# Patient Record
Sex: Female | Born: 1955
Health system: Southern US, Community
[De-identification: ages and names within clinical notes are randomized; demographics above are authoritative.]

## PROBLEM LIST (undated history)

## (undated) DIAGNOSIS — R112 Nausea with vomiting, unspecified: Secondary | ICD-10-CM

## (undated) DIAGNOSIS — Z9889 Other specified postprocedural states: Secondary | ICD-10-CM

## (undated) DIAGNOSIS — I10 Essential (primary) hypertension: Secondary | ICD-10-CM

## (undated) DIAGNOSIS — D329 Benign neoplasm of meninges, unspecified: Secondary | ICD-10-CM

## (undated) DIAGNOSIS — Z972 Presence of dental prosthetic device (complete) (partial): Secondary | ICD-10-CM

## (undated) DIAGNOSIS — F329 Major depressive disorder, single episode, unspecified: Secondary | ICD-10-CM

## (undated) DIAGNOSIS — K219 Gastro-esophageal reflux disease without esophagitis: Secondary | ICD-10-CM

## (undated) DIAGNOSIS — M1990 Unspecified osteoarthritis, unspecified site: Secondary | ICD-10-CM

## (undated) DIAGNOSIS — E119 Type 2 diabetes mellitus without complications: Secondary | ICD-10-CM

## (undated) DIAGNOSIS — I2510 Atherosclerotic heart disease of native coronary artery without angina pectoris: Secondary | ICD-10-CM

## (undated) DIAGNOSIS — T7840XA Allergy, unspecified, initial encounter: Secondary | ICD-10-CM

## (undated) DIAGNOSIS — I2119 ST elevation (STEMI) myocardial infarction involving other coronary artery of inferior wall: Secondary | ICD-10-CM

## (undated) DIAGNOSIS — F32A Depression, unspecified: Secondary | ICD-10-CM

## (undated) HISTORY — PX: ABDOMINAL HYSTERECTOMY: SHX81

## (undated) HISTORY — PX: KNEE SURGERY: SHX244

## (undated) HISTORY — DX: Benign neoplasm of meninges, unspecified: D32.9

## (undated) HISTORY — PX: COLONOSCOPY W/ BIOPSIES AND POLYPECTOMY: SHX1376

## (undated) HISTORY — PX: CORONARY ANGIOPLASTY WITH STENT PLACEMENT: SHX49

## (undated) HISTORY — PX: BREAST SURGERY: SHX581

## (undated) HISTORY — PX: BREAST EXCISIONAL BIOPSY: SUR124

## (undated) HISTORY — DX: Allergy, unspecified, initial encounter: T78.40XA

## (undated) HISTORY — PX: MULTIPLE TOOTH EXTRACTIONS: SHX2053

## (undated) HISTORY — PX: TUBAL LIGATION: SHX77

---

## 2001-07-21 ENCOUNTER — Other Ambulatory Visit: Admission: RE | Admit: 2001-07-21 | Discharge: 2001-07-21 | Payer: Self-pay | Admitting: Obstetrics and Gynecology

## 2002-07-28 ENCOUNTER — Other Ambulatory Visit: Admission: RE | Admit: 2002-07-28 | Discharge: 2002-07-28 | Payer: Self-pay | Admitting: Obstetrics and Gynecology

## 2003-07-29 ENCOUNTER — Other Ambulatory Visit: Admission: RE | Admit: 2003-07-29 | Discharge: 2003-07-29 | Payer: Self-pay | Admitting: Obstetrics and Gynecology

## 2005-03-06 ENCOUNTER — Ambulatory Visit: Payer: Self-pay | Admitting: Family Medicine

## 2005-03-14 ENCOUNTER — Emergency Department (HOSPITAL_COMMUNITY): Admission: EM | Admit: 2005-03-14 | Discharge: 2005-03-14 | Payer: Self-pay | Admitting: Emergency Medicine

## 2005-05-10 ENCOUNTER — Ambulatory Visit: Payer: Self-pay | Admitting: Family Medicine

## 2005-05-15 ENCOUNTER — Ambulatory Visit (HOSPITAL_COMMUNITY): Admission: RE | Admit: 2005-05-15 | Discharge: 2005-05-15 | Payer: Self-pay | Admitting: Family Medicine

## 2005-05-16 ENCOUNTER — Ambulatory Visit: Payer: Self-pay | Admitting: Family Medicine

## 2005-05-18 ENCOUNTER — Ambulatory Visit (HOSPITAL_COMMUNITY): Admission: RE | Admit: 2005-05-18 | Discharge: 2005-05-18 | Payer: Self-pay | Admitting: Family Medicine

## 2005-05-29 ENCOUNTER — Ambulatory Visit: Payer: Self-pay | Admitting: Family Medicine

## 2005-06-07 ENCOUNTER — Ambulatory Visit (HOSPITAL_COMMUNITY): Admission: RE | Admit: 2005-06-07 | Discharge: 2005-06-07 | Payer: Self-pay | Admitting: Obstetrics and Gynecology

## 2005-11-06 ENCOUNTER — Ambulatory Visit: Payer: Self-pay | Admitting: Family Medicine

## 2006-06-20 ENCOUNTER — Ambulatory Visit: Payer: Self-pay | Admitting: Family Medicine

## 2006-06-20 ENCOUNTER — Encounter (INDEPENDENT_AMBULATORY_CARE_PROVIDER_SITE_OTHER): Payer: Self-pay | Admitting: *Deleted

## 2006-07-04 ENCOUNTER — Ambulatory Visit: Payer: Self-pay | Admitting: Family Medicine

## 2006-07-25 ENCOUNTER — Ambulatory Visit: Payer: Self-pay | Admitting: Family Medicine

## 2006-07-29 ENCOUNTER — Ambulatory Visit: Payer: Self-pay | Admitting: Family Medicine

## 2006-08-15 ENCOUNTER — Ambulatory Visit: Payer: Self-pay | Admitting: Family Medicine

## 2006-12-05 ENCOUNTER — Ambulatory Visit: Payer: Self-pay | Admitting: Family Medicine

## 2006-12-10 DIAGNOSIS — F329 Major depressive disorder, single episode, unspecified: Secondary | ICD-10-CM

## 2006-12-10 DIAGNOSIS — F17200 Nicotine dependence, unspecified, uncomplicated: Secondary | ICD-10-CM | POA: Insufficient documentation

## 2006-12-10 DIAGNOSIS — J329 Chronic sinusitis, unspecified: Secondary | ICD-10-CM | POA: Insufficient documentation

## 2006-12-10 DIAGNOSIS — F419 Anxiety disorder, unspecified: Secondary | ICD-10-CM | POA: Insufficient documentation

## 2006-12-10 DIAGNOSIS — J309 Allergic rhinitis, unspecified: Secondary | ICD-10-CM

## 2006-12-10 DIAGNOSIS — J3089 Other allergic rhinitis: Secondary | ICD-10-CM | POA: Insufficient documentation

## 2006-12-10 DIAGNOSIS — K219 Gastro-esophageal reflux disease without esophagitis: Secondary | ICD-10-CM | POA: Insufficient documentation

## 2006-12-10 DIAGNOSIS — E669 Obesity, unspecified: Secondary | ICD-10-CM

## 2006-12-10 DIAGNOSIS — F32A Depression, unspecified: Secondary | ICD-10-CM | POA: Insufficient documentation

## 2006-12-10 DIAGNOSIS — E785 Hyperlipidemia, unspecified: Secondary | ICD-10-CM | POA: Insufficient documentation

## 2006-12-10 DIAGNOSIS — Z6841 Body Mass Index (BMI) 40.0 and over, adult: Secondary | ICD-10-CM | POA: Insufficient documentation

## 2006-12-10 DIAGNOSIS — I10 Essential (primary) hypertension: Secondary | ICD-10-CM | POA: Insufficient documentation

## 2007-01-15 ENCOUNTER — Ambulatory Visit: Payer: Self-pay | Admitting: Family Medicine

## 2007-01-15 LAB — CONVERTED CEMR LAB
Cholesterol, target level: 200 mg/dL
LDL Goal: 160 mg/dL

## 2007-01-20 ENCOUNTER — Ambulatory Visit (HOSPITAL_COMMUNITY): Admission: RE | Admit: 2007-01-20 | Discharge: 2007-01-20 | Payer: Self-pay | Admitting: Family Medicine

## 2007-01-20 ENCOUNTER — Encounter (INDEPENDENT_AMBULATORY_CARE_PROVIDER_SITE_OTHER): Payer: Self-pay | Admitting: Family Medicine

## 2007-02-12 ENCOUNTER — Ambulatory Visit: Payer: Self-pay | Admitting: Family Medicine

## 2007-02-12 LAB — CONVERTED CEMR LAB: LDL Goal: 130 mg/dL

## 2007-02-13 ENCOUNTER — Encounter (INDEPENDENT_AMBULATORY_CARE_PROVIDER_SITE_OTHER): Payer: Self-pay | Admitting: Family Medicine

## 2007-02-14 ENCOUNTER — Telehealth (INDEPENDENT_AMBULATORY_CARE_PROVIDER_SITE_OTHER): Payer: Self-pay | Admitting: Family Medicine

## 2007-02-14 ENCOUNTER — Encounter (INDEPENDENT_AMBULATORY_CARE_PROVIDER_SITE_OTHER): Payer: Self-pay | Admitting: Family Medicine

## 2007-02-18 ENCOUNTER — Telehealth (INDEPENDENT_AMBULATORY_CARE_PROVIDER_SITE_OTHER): Payer: Self-pay | Admitting: Family Medicine

## 2007-02-18 ENCOUNTER — Ambulatory Visit (HOSPITAL_COMMUNITY): Admission: RE | Admit: 2007-02-18 | Discharge: 2007-02-18 | Payer: Self-pay | Admitting: Family Medicine

## 2007-02-18 LAB — CONVERTED CEMR LAB
AST: 17 units/L (ref 0–37)
Alkaline Phosphatase: 100 units/L (ref 39–117)
BUN: 12 mg/dL (ref 6–23)
CO2: 23 meq/L (ref 19–32)
Calcium: 9.6 mg/dL (ref 8.4–10.5)
Chloride: 101 meq/L (ref 96–112)
Creatinine, Ser: 0.67 mg/dL (ref 0.40–1.20)
HDL: 59 mg/dL (ref >39)
Hepatitis B-Post: 3.6 milliintl units/mL
LDL Cholesterol: 137 mg/dL — ABNORMAL HIGH (ref 0–99)
Mumps IgG: 2.12 — ABNORMAL HIGH
Potassium: 3.8 meq/L (ref 3.5–5.3)
Sodium: 137 meq/L (ref 135–145)
Total Bilirubin: 0.6 mg/dL (ref 0.3–1.2)
Total CHOL/HDL Ratio: 3.5
Varicella IgG: 2.48 — ABNORMAL HIGH

## 2007-02-19 ENCOUNTER — Telehealth (INDEPENDENT_AMBULATORY_CARE_PROVIDER_SITE_OTHER): Payer: Self-pay | Admitting: Family Medicine

## 2007-02-24 ENCOUNTER — Ambulatory Visit: Payer: Self-pay | Admitting: Family Medicine

## 2007-02-25 ENCOUNTER — Encounter (INDEPENDENT_AMBULATORY_CARE_PROVIDER_SITE_OTHER): Payer: Self-pay | Admitting: Family Medicine

## 2007-02-25 LAB — CONVERTED CEMR LAB: Rubeola IgG: 2.52 — ABNORMAL HIGH

## 2007-03-05 ENCOUNTER — Encounter: Admission: RE | Admit: 2007-03-05 | Discharge: 2007-03-05 | Payer: Self-pay | Admitting: Family Medicine

## 2007-03-05 ENCOUNTER — Telehealth (INDEPENDENT_AMBULATORY_CARE_PROVIDER_SITE_OTHER): Payer: Self-pay | Admitting: Family Medicine

## 2007-03-07 ENCOUNTER — Encounter (INDEPENDENT_AMBULATORY_CARE_PROVIDER_SITE_OTHER): Payer: Self-pay | Admitting: Family Medicine

## 2007-03-27 ENCOUNTER — Ambulatory Visit: Payer: Self-pay | Admitting: Family Medicine

## 2007-03-27 DIAGNOSIS — R5381 Other malaise: Secondary | ICD-10-CM | POA: Insufficient documentation

## 2007-03-27 DIAGNOSIS — R5383 Other fatigue: Secondary | ICD-10-CM

## 2007-03-28 ENCOUNTER — Encounter (INDEPENDENT_AMBULATORY_CARE_PROVIDER_SITE_OTHER): Payer: Self-pay | Admitting: Family Medicine

## 2007-03-31 LAB — CONVERTED CEMR LAB
BUN: 16 mg/dL (ref 6–23)
Basophils Absolute: 0 10*3/uL (ref 0.0–0.1)
Basophils Relative: 0 % (ref 0–1)
CO2: 16 meq/L — ABNORMAL LOW (ref 19–32)
Calcium: 9.2 mg/dL (ref 8.4–10.5)
Chloride: 110 meq/L (ref 96–112)
Creatinine, Ser: 0.77 mg/dL (ref 0.40–1.20)
Eosinophils Absolute: 0.2 10*3/uL (ref 0.0–0.7)
Eosinophils Relative: 2 % (ref 0–5)
Glucose, Bld: 111 mg/dL — ABNORMAL HIGH (ref 70–99)
HCT: 42.3 % (ref 36.0–46.0)
Hemoglobin: 14.5 g/dL (ref 12.0–15.0)
Lymphocytes Relative: 41 % (ref 12–46)
Lymphs Abs: 2.8 10*3/uL (ref 0.7–3.3)
MCHC: 34.3 g/dL (ref 30.0–36.0)
MCV: 84.4 fL (ref 78.0–100.0)
Monocytes Absolute: 0.5 10*3/uL (ref 0.2–0.7)
Monocytes Relative: 8 % (ref 3–11)
Neutro Abs: 3.4 10*3/uL (ref 1.7–7.7)
Neutrophils Relative %: 49 % (ref 43–77)
Platelets: 277 10*3/uL (ref 150–400)
Potassium: 4.1 meq/L (ref 3.5–5.3)
RBC: 5.01 M/uL (ref 3.87–5.11)
RDW: 15.4 % — ABNORMAL HIGH (ref 11.5–14.0)
Sodium: 143 meq/L (ref 135–145)
TSH: 0.915 microintl units/mL (ref 0.350–5.50)
WBC: 7 10*3/uL (ref 4.0–10.5)

## 2007-04-08 ENCOUNTER — Encounter (INDEPENDENT_AMBULATORY_CARE_PROVIDER_SITE_OTHER): Payer: Self-pay | Admitting: Family Medicine

## 2007-04-17 ENCOUNTER — Ambulatory Visit (HOSPITAL_COMMUNITY): Admission: RE | Admit: 2007-04-17 | Discharge: 2007-04-17 | Payer: Self-pay | Admitting: Family Medicine

## 2007-04-17 ENCOUNTER — Encounter (INDEPENDENT_AMBULATORY_CARE_PROVIDER_SITE_OTHER): Payer: Self-pay | Admitting: Family Medicine

## 2007-04-18 ENCOUNTER — Encounter (INDEPENDENT_AMBULATORY_CARE_PROVIDER_SITE_OTHER): Payer: Self-pay | Admitting: Family Medicine

## 2007-04-18 DIAGNOSIS — D1800 Hemangioma unspecified site: Secondary | ICD-10-CM | POA: Insufficient documentation

## 2007-04-22 ENCOUNTER — Encounter (INDEPENDENT_AMBULATORY_CARE_PROVIDER_SITE_OTHER): Payer: Self-pay | Admitting: Family Medicine

## 2007-05-06 ENCOUNTER — Encounter (INDEPENDENT_AMBULATORY_CARE_PROVIDER_SITE_OTHER): Payer: Self-pay | Admitting: Family Medicine

## 2007-05-30 ENCOUNTER — Ambulatory Visit: Payer: Self-pay | Admitting: Family Medicine

## 2007-05-30 ENCOUNTER — Telehealth (INDEPENDENT_AMBULATORY_CARE_PROVIDER_SITE_OTHER): Payer: Self-pay | Admitting: Family Medicine

## 2007-06-16 ENCOUNTER — Telehealth (INDEPENDENT_AMBULATORY_CARE_PROVIDER_SITE_OTHER): Payer: Self-pay | Admitting: Family Medicine

## 2007-06-24 ENCOUNTER — Ambulatory Visit: Payer: Self-pay | Admitting: Family Medicine

## 2007-06-24 ENCOUNTER — Telehealth (INDEPENDENT_AMBULATORY_CARE_PROVIDER_SITE_OTHER): Payer: Self-pay | Admitting: *Deleted

## 2007-06-24 DIAGNOSIS — G4710 Hypersomnia, unspecified: Secondary | ICD-10-CM | POA: Insufficient documentation

## 2007-06-24 DIAGNOSIS — G4730 Sleep apnea, unspecified: Secondary | ICD-10-CM

## 2007-06-25 ENCOUNTER — Encounter (INDEPENDENT_AMBULATORY_CARE_PROVIDER_SITE_OTHER): Payer: Self-pay | Admitting: Family Medicine

## 2007-07-04 ENCOUNTER — Encounter (INDEPENDENT_AMBULATORY_CARE_PROVIDER_SITE_OTHER): Payer: Self-pay | Admitting: Family Medicine

## 2007-07-16 ENCOUNTER — Encounter (INDEPENDENT_AMBULATORY_CARE_PROVIDER_SITE_OTHER): Payer: Self-pay | Admitting: Family Medicine

## 2007-07-16 ENCOUNTER — Ambulatory Visit (HOSPITAL_BASED_OUTPATIENT_CLINIC_OR_DEPARTMENT_OTHER): Admission: RE | Admit: 2007-07-16 | Discharge: 2007-07-16 | Payer: Self-pay | Admitting: Family Medicine

## 2007-07-20 ENCOUNTER — Ambulatory Visit: Payer: Self-pay | Admitting: Internal Medicine

## 2007-07-21 ENCOUNTER — Encounter (INDEPENDENT_AMBULATORY_CARE_PROVIDER_SITE_OTHER): Payer: Self-pay | Admitting: Family Medicine

## 2007-08-09 ENCOUNTER — Emergency Department (HOSPITAL_COMMUNITY): Admission: EM | Admit: 2007-08-09 | Discharge: 2007-08-09 | Payer: Self-pay | Admitting: Emergency Medicine

## 2007-08-15 ENCOUNTER — Telehealth (INDEPENDENT_AMBULATORY_CARE_PROVIDER_SITE_OTHER): Payer: Self-pay | Admitting: *Deleted

## 2007-08-19 ENCOUNTER — Ambulatory Visit: Payer: Self-pay | Admitting: Family Medicine

## 2007-08-19 ENCOUNTER — Telehealth (INDEPENDENT_AMBULATORY_CARE_PROVIDER_SITE_OTHER): Payer: Self-pay | Admitting: *Deleted

## 2007-08-20 ENCOUNTER — Telehealth (INDEPENDENT_AMBULATORY_CARE_PROVIDER_SITE_OTHER): Payer: Self-pay | Admitting: *Deleted

## 2007-08-20 ENCOUNTER — Telehealth (INDEPENDENT_AMBULATORY_CARE_PROVIDER_SITE_OTHER): Payer: Self-pay | Admitting: Family Medicine

## 2007-08-21 ENCOUNTER — Encounter (INDEPENDENT_AMBULATORY_CARE_PROVIDER_SITE_OTHER): Payer: Self-pay | Admitting: Family Medicine

## 2007-09-02 ENCOUNTER — Telehealth (INDEPENDENT_AMBULATORY_CARE_PROVIDER_SITE_OTHER): Payer: Self-pay | Admitting: *Deleted

## 2007-09-08 ENCOUNTER — Telehealth (INDEPENDENT_AMBULATORY_CARE_PROVIDER_SITE_OTHER): Payer: Self-pay | Admitting: *Deleted

## 2007-09-09 ENCOUNTER — Encounter (INDEPENDENT_AMBULATORY_CARE_PROVIDER_SITE_OTHER): Payer: Self-pay | Admitting: Family Medicine

## 2007-09-11 ENCOUNTER — Telehealth (INDEPENDENT_AMBULATORY_CARE_PROVIDER_SITE_OTHER): Payer: Self-pay | Admitting: *Deleted

## 2007-09-11 ENCOUNTER — Encounter (HOSPITAL_COMMUNITY): Admission: RE | Admit: 2007-09-11 | Discharge: 2007-10-11 | Payer: Self-pay | Admitting: Family Medicine

## 2007-12-03 ENCOUNTER — Ambulatory Visit: Payer: Self-pay | Admitting: Family Medicine

## 2007-12-03 DIAGNOSIS — G5600 Carpal tunnel syndrome, unspecified upper limb: Secondary | ICD-10-CM | POA: Insufficient documentation

## 2007-12-04 ENCOUNTER — Encounter: Payer: Self-pay | Admitting: Family Medicine

## 2007-12-19 ENCOUNTER — Telehealth (INDEPENDENT_AMBULATORY_CARE_PROVIDER_SITE_OTHER): Payer: Self-pay | Admitting: Family Medicine

## 2007-12-24 ENCOUNTER — Telehealth (INDEPENDENT_AMBULATORY_CARE_PROVIDER_SITE_OTHER): Payer: Self-pay | Admitting: *Deleted

## 2007-12-26 ENCOUNTER — Ambulatory Visit: Payer: Self-pay | Admitting: Family Medicine

## 2007-12-30 ENCOUNTER — Ambulatory Visit (HOSPITAL_COMMUNITY): Admission: RE | Admit: 2007-12-30 | Discharge: 2007-12-30 | Payer: Self-pay | Admitting: Family Medicine

## 2007-12-30 ENCOUNTER — Telehealth (INDEPENDENT_AMBULATORY_CARE_PROVIDER_SITE_OTHER): Payer: Self-pay | Admitting: *Deleted

## 2007-12-30 ENCOUNTER — Encounter (INDEPENDENT_AMBULATORY_CARE_PROVIDER_SITE_OTHER): Payer: Self-pay | Admitting: Family Medicine

## 2007-12-31 ENCOUNTER — Telehealth (INDEPENDENT_AMBULATORY_CARE_PROVIDER_SITE_OTHER): Payer: Self-pay | Admitting: *Deleted

## 2008-01-12 ENCOUNTER — Encounter (INDEPENDENT_AMBULATORY_CARE_PROVIDER_SITE_OTHER): Payer: Self-pay | Admitting: Family Medicine

## 2008-01-20 ENCOUNTER — Ambulatory Visit: Payer: Self-pay | Admitting: Family Medicine

## 2008-03-05 ENCOUNTER — Telehealth (INDEPENDENT_AMBULATORY_CARE_PROVIDER_SITE_OTHER): Payer: Self-pay | Admitting: Family Medicine

## 2008-03-05 ENCOUNTER — Ambulatory Visit: Payer: Self-pay | Admitting: Family Medicine

## 2008-03-05 DIAGNOSIS — S0990XA Unspecified injury of head, initial encounter: Secondary | ICD-10-CM | POA: Insufficient documentation

## 2008-03-09 ENCOUNTER — Telehealth (INDEPENDENT_AMBULATORY_CARE_PROVIDER_SITE_OTHER): Payer: Self-pay | Admitting: *Deleted

## 2008-03-10 ENCOUNTER — Ambulatory Visit (HOSPITAL_COMMUNITY): Admission: RE | Admit: 2008-03-10 | Discharge: 2008-03-10 | Payer: Self-pay | Admitting: Family Medicine

## 2008-03-10 ENCOUNTER — Telehealth (INDEPENDENT_AMBULATORY_CARE_PROVIDER_SITE_OTHER): Payer: Self-pay | Admitting: *Deleted

## 2008-03-10 ENCOUNTER — Encounter (INDEPENDENT_AMBULATORY_CARE_PROVIDER_SITE_OTHER): Payer: Self-pay | Admitting: Family Medicine

## 2008-03-10 LAB — CONVERTED CEMR LAB
ALT: 16 units/L (ref 0–35)
AST: 18 units/L (ref 0–37)
BUN: 13 mg/dL (ref 6–23)
Basophils Relative: 0 % (ref 0–1)
Calcium: 9.6 mg/dL (ref 8.4–10.5)
Chloride: 101 meq/L (ref 96–112)
Creatinine, Ser: 0.61 mg/dL (ref 0.40–1.20)
Eosinophils Absolute: 0.2 10*3/uL (ref 0.0–0.7)
Glucose, Bld: 105 mg/dL — ABNORMAL HIGH (ref 70–99)
Hemoglobin: 13.8 g/dL (ref 12.0–15.0)
Lymphocytes Relative: 38 % (ref 12–46)
Lymphs Abs: 2.6 10*3/uL (ref 0.7–4.0)
MCHC: 34.7 g/dL (ref 30.0–36.0)
Neutrophils Relative %: 49 % (ref 43–77)
Potassium: 3.8 meq/L (ref 3.5–5.3)
RBC: 4.81 M/uL (ref 3.87–5.11)
RDW: 15.1 % (ref 11.5–15.5)
Total Bilirubin: 0.4 mg/dL (ref 0.3–1.2)
WBC: 7 10*3/uL (ref 4.0–10.5)

## 2008-03-11 ENCOUNTER — Telehealth (INDEPENDENT_AMBULATORY_CARE_PROVIDER_SITE_OTHER): Payer: Self-pay | Admitting: *Deleted

## 2008-03-11 LAB — CONVERTED CEMR LAB
HDL: 56 mg/dL (ref >39)
LDL Cholesterol: 131 mg/dL — ABNORMAL HIGH (ref 0–99)
TSH: 1.212 microintl units/mL (ref 0.350–5.50)
VLDL: 12 mg/dL (ref 0–40)

## 2008-03-16 ENCOUNTER — Encounter (INDEPENDENT_AMBULATORY_CARE_PROVIDER_SITE_OTHER): Payer: Self-pay | Admitting: Family Medicine

## 2008-03-23 ENCOUNTER — Encounter (INDEPENDENT_AMBULATORY_CARE_PROVIDER_SITE_OTHER): Payer: Self-pay | Admitting: Family Medicine

## 2008-03-30 ENCOUNTER — Encounter (INDEPENDENT_AMBULATORY_CARE_PROVIDER_SITE_OTHER): Payer: Self-pay | Admitting: Family Medicine

## 2008-04-02 ENCOUNTER — Ambulatory Visit: Payer: Self-pay | Admitting: Family Medicine

## 2008-04-02 ENCOUNTER — Telehealth (INDEPENDENT_AMBULATORY_CARE_PROVIDER_SITE_OTHER): Payer: Self-pay | Admitting: Family Medicine

## 2008-04-06 ENCOUNTER — Encounter (INDEPENDENT_AMBULATORY_CARE_PROVIDER_SITE_OTHER): Payer: Self-pay | Admitting: Family Medicine

## 2008-04-14 ENCOUNTER — Telehealth (INDEPENDENT_AMBULATORY_CARE_PROVIDER_SITE_OTHER): Payer: Self-pay | Admitting: *Deleted

## 2008-04-14 ENCOUNTER — Ambulatory Visit: Payer: Self-pay | Admitting: Family Medicine

## 2008-04-14 LAB — CONVERTED CEMR LAB
Glucose, Urine, Semiquant: NEGATIVE
Nitrite: NEGATIVE
Protein, U semiquant: NEGATIVE
pH: 7

## 2008-06-08 ENCOUNTER — Ambulatory Visit: Payer: Self-pay | Admitting: Family Medicine

## 2008-07-06 ENCOUNTER — Ambulatory Visit: Payer: Self-pay | Admitting: Family Medicine

## 2008-08-04 ENCOUNTER — Telehealth (INDEPENDENT_AMBULATORY_CARE_PROVIDER_SITE_OTHER): Payer: Self-pay | Admitting: *Deleted

## 2008-08-20 ENCOUNTER — Ambulatory Visit: Payer: Self-pay | Admitting: Family Medicine

## 2008-08-25 ENCOUNTER — Telehealth (INDEPENDENT_AMBULATORY_CARE_PROVIDER_SITE_OTHER): Payer: Self-pay | Admitting: *Deleted

## 2008-08-31 ENCOUNTER — Ambulatory Visit: Payer: Self-pay | Admitting: Family Medicine

## 2008-09-28 ENCOUNTER — Ambulatory Visit: Payer: Self-pay | Admitting: Family Medicine

## 2009-02-04 ENCOUNTER — Ambulatory Visit: Payer: Self-pay | Admitting: Family Medicine

## 2009-02-06 ENCOUNTER — Encounter (INDEPENDENT_AMBULATORY_CARE_PROVIDER_SITE_OTHER): Payer: Self-pay | Admitting: Family Medicine

## 2009-02-07 LAB — CONVERTED CEMR LAB
Albumin: 4.2 g/dL (ref 3.5–5.2)
Alkaline Phosphatase: 120 units/L — ABNORMAL HIGH (ref 39–117)
BUN: 17 mg/dL (ref 6–23)
Band Neutrophils: 0 % (ref 0–10)
Basophils Absolute: 0 10*3/uL (ref 0.0–0.1)
Basophils Relative: 1 % (ref 0–1)
CO2: 25 meq/L (ref 19–32)
Calcium: 9.5 mg/dL (ref 8.4–10.5)
Chloride: 103 meq/L (ref 96–112)
Creatinine, Ser: 0.72 mg/dL (ref 0.40–1.20)
Glucose, Bld: 113 mg/dL — ABNORMAL HIGH (ref 70–99)
HCT: 42.1 % (ref 36.0–46.0)
MCV: 83.2 fL (ref 78.0–100.0)
Neutro Abs: 3.5 10*3/uL (ref 1.7–7.7)
Neutrophils Relative %: 49 % (ref 43–77)
Platelets: 290 10*3/uL (ref 150–400)
Total CHOL/HDL Ratio: 3.1
Total Protein: 7.4 g/dL (ref 6.0–8.3)
VLDL: 14 mg/dL (ref 0–40)

## 2009-02-14 ENCOUNTER — Ambulatory Visit (HOSPITAL_COMMUNITY): Admission: RE | Admit: 2009-02-14 | Discharge: 2009-02-14 | Payer: Self-pay | Admitting: Family Medicine

## 2009-02-22 ENCOUNTER — Telehealth (INDEPENDENT_AMBULATORY_CARE_PROVIDER_SITE_OTHER): Payer: Self-pay | Admitting: *Deleted

## 2009-03-21 ENCOUNTER — Encounter (INDEPENDENT_AMBULATORY_CARE_PROVIDER_SITE_OTHER): Payer: Self-pay | Admitting: Family Medicine

## 2009-03-21 ENCOUNTER — Ambulatory Visit: Payer: Self-pay | Admitting: Internal Medicine

## 2009-03-21 DIAGNOSIS — M5430 Sciatica, unspecified side: Secondary | ICD-10-CM | POA: Insufficient documentation

## 2009-04-07 ENCOUNTER — Encounter (INDEPENDENT_AMBULATORY_CARE_PROVIDER_SITE_OTHER): Payer: Self-pay | Admitting: Family Medicine

## 2009-04-11 ENCOUNTER — Telehealth (INDEPENDENT_AMBULATORY_CARE_PROVIDER_SITE_OTHER): Payer: Self-pay | Admitting: Internal Medicine

## 2009-04-18 ENCOUNTER — Encounter (INDEPENDENT_AMBULATORY_CARE_PROVIDER_SITE_OTHER): Payer: Self-pay | Admitting: Family Medicine

## 2010-12-24 ENCOUNTER — Encounter: Payer: Self-pay | Admitting: Family Medicine

## 2010-12-31 LAB — CONVERTED CEMR LAB
Ketones, urine, test strip: NEGATIVE
Protein, U semiquant: NEGATIVE
Urobilinogen, UA: 0.2
pH: 6

## 2011-01-04 NOTE — Letter (Signed)
Summary: Historic Patient File  Historic Patient File   Imported By: Lind Guest 12/08/2010 15:52:53  _____________________________________________________________________  External Attachment:    Type:   Image     Comment:   External Document

## 2011-04-17 NOTE — Procedures (Signed)
Theresa Gibson, Theresa Gibson               ACCOUNT NO.:  [id number]   MEDICAL RECORD NO.:  [id number]          PATIENT TYPE:  OUT   LOCATION:  SLEEP CENTER                 FACILITY:  [facility]   PHYSICIAN:  Clinton D. Maple Hudson, MD, FCCP, FACPDATE OF BIRTH:  03/21/1958   DATE OF STUDY:  07/16/2007                            NOCTURNAL POLYSOMNOGRAM   REFERRING PHYSICIAN:  Franchot Heidelberg, M.D.   INDICATION FOR STUDY:  Insomnia with sleep apnea.   EPWORTH SLEEPINESS SCORE:  12/24.   BMI:  33.9.   WEIGHT:  199 pounds.   MEDICATIONS:  Listed and reviewed.   SLEEP ARCHITECTURE:  Total sleep time 295 minutes with sleep efficiency  of 83%.  Stage 1 was 11%, stage 2 was 64%, stage 3 was 10%, REM 16% of  total sleep time.  Sleep latency 10 minutes.  REM latency 122 minutes.  Awake after sleep onset 32 minutes.  Arousal index 20.1.  Tylenol P.M.  was taken at 0017 a.m.  This was a daytime study recorded from 8:39 a.m.  to 2:33 p.m.   RESPIRATORY DATA:  Apnea/hypopnea index (AHI, RDI) 2.8 obstructive  events per hour which is within normal limits (normal range 0-5 per  hour).  Events were limited to a total of 14 hypopneas, all associated  with supine sleep position.  REM AHI 17.7 per hour.  CPAP titration was  not indicated by split protocol.   OXYGEN DATA:  Moderate to loud snoring with oxygen saturation nadir 87%.  Mean oxygen saturation through the study 96% on room air.   CARDIAC DATA:  Normal sinus rhythm.   MOVEMENT-PARASOMNIA:  Occasional limb jerk, insignificant.   IMPRESSIONS-RECOMMENDATIONS:  1. Daytime sleep study done to accommodate the patient's third shift      work schedule, with Tylenol P.M. taken at 0715 a.m.  2. Sleep architecture was not remarkable for sleep center environment.  3. Occasional sleep disorder breathing events, apnea/hypopnea index      2.8 per hour (normal range 0-5 per hour) with moderate to loud      intermittent snoring and oxygen desaturation to a nadir  of 87%.      Clinton D. Maple Hudson, MD, [facility], FACP  Diplomate, Biomedical engineer of Sleep Medicine  Electronically Signed     CDY/MEDQ  D:  07/20/2007 14:01:20  T:  07/21/2007 09:13:28  Job:  [id number]

## 2011-09-14 LAB — CBC
Platelets: 256
RDW: 14.4 — ABNORMAL HIGH

## 2011-09-14 LAB — DIFFERENTIAL
Basophils Relative: 0
Lymphs Abs: 1.6
Monocytes Absolute: 0.4
Monocytes Relative: 4
Neutro Abs: 8 — ABNORMAL HIGH

## 2011-09-14 LAB — COMPREHENSIVE METABOLIC PANEL
ALT: 19
Albumin: 3.4 — ABNORMAL LOW
Alkaline Phosphatase: 111
BUN: 10
Calcium: 9.2
Potassium: 4.1
Sodium: 136
Total Protein: 7

## 2012-04-06 ENCOUNTER — Encounter (HOSPITAL_COMMUNITY): Payer: Self-pay

## 2012-04-06 ENCOUNTER — Emergency Department (HOSPITAL_COMMUNITY)
Admission: EM | Admit: 2012-04-06 | Discharge: 2012-04-06 | Disposition: A | Discharge status: Home / Self Care | Payer: Self-pay | Attending: Emergency Medicine | Admitting: Emergency Medicine

## 2012-04-06 DIAGNOSIS — L959 Vasculitis limited to the skin, unspecified: Secondary | ICD-10-CM

## 2012-04-06 DIAGNOSIS — Z79899 Other long term (current) drug therapy: Secondary | ICD-10-CM | POA: Insufficient documentation

## 2012-04-06 DIAGNOSIS — I10 Essential (primary) hypertension: Secondary | ICD-10-CM | POA: Insufficient documentation

## 2012-04-06 DIAGNOSIS — L988 Other specified disorders of the skin and subcutaneous tissue: Secondary | ICD-10-CM | POA: Insufficient documentation

## 2012-04-06 HISTORY — DX: Essential (primary) hypertension: I10

## 2012-04-06 HISTORY — DX: Unspecified osteoarthritis, unspecified site: M19.90

## 2012-04-06 LAB — CBC
HCT: 41.7 % (ref 36.0–46.0)
Hemoglobin: 14.3 g/dL (ref 12.0–15.0)
MCH: 28.9 pg (ref 26.0–34.0)
MCV: 84.4 fL (ref 78.0–100.0)
RBC: 4.94 MIL/uL (ref 3.87–5.11)

## 2012-04-06 LAB — DIFFERENTIAL
Eosinophils Absolute: 0.1 10*3/uL (ref 0.0–0.7)
Lymphs Abs: 2.2 10*3/uL (ref 0.7–4.0)
Monocytes Relative: 6 % (ref 3–12)
Neutrophils Relative %: 53 % (ref 43–77)

## 2012-04-06 LAB — PROTIME-INR: INR: 0.99 (ref 0.00–1.49)

## 2012-04-06 NOTE — ED Provider Notes (Signed)
History     CSN: [id number]  Arrival date & time 04/06/12  1025   First MD Initiated Contact with Patient 04/06/12 1056      Chief Complaint  Patient presents with  . Rash    (Consider location/radiation/quality/duration/timing/severity/associated sxs/prior treatment) HPI Comments: Pt notes multiple red spots on L lower leg that have been there ~ 1 week.  No trauma.  No notable skin contacts.  No blood noted in urine, stools or from coughing.  No neurol sxs.  No known bleeding abnormalities.  The history is provided by the patient. No language interpreter was used.    Past Medical History  Diagnosis Date  . Hypertension   . Arthritis     Past Surgical History  Procedure Date  . Knee surgery   . Abdominal hysterectomy     No family history on file.  History  Substance Use Topics  . Smoking status: Current Some Day Smoker    Types: Cigarettes  . Smokeless tobacco: Not on file  . Alcohol Use: Yes     occ    OB History    Grav Para Term Preterm Abortions TAB SAB Ect Mult Living                  Review of Systems  Constitutional: Negative for fever.  HENT: Negative for nosebleeds.   Respiratory: Negative for cough.   Genitourinary: Negative for hematuria.  All other systems reviewed and are negative.    Allergies  Ace inhibitors  Home Medications   Current Outpatient Rx  Name Route Sig Dispense Refill  . AMLODIPINE BESYLATE 10 MG PO TABS Oral Take 10 mg by mouth daily.    . TOPIRAMATE 50 MG PO TABS Oral Take 50 mg by mouth 2 (two) times daily.      BP 107/76  Pulse 66  Temp(Src) 97.9 F (36.6 C) (Oral)  Resp 20  Ht 5\' 4"  (1.626 m)  Wt 200 lb (90.719 kg)  BMI 34.33 kg/m2  SpO2 99%  Physical Exam  Nursing note and vitals reviewed. Constitutional: She is oriented to person, place, and time. She appears well-developed and well-nourished. No distress.  HENT:  Head: Normocephalic and atraumatic.  Eyes: EOM are normal.  Neck: Normal range of  motion.  Cardiovascular: Normal rate, regular rhythm and normal heart sounds.   Pulmonary/Chest: Effort normal and breath sounds normal.  Abdominal: Soft. She exhibits no distension. There is no tenderness.  Musculoskeletal: Normal range of motion.       Legs:      10-12 circular red spots on L lower leg from 1-2 cm in diam.  Areas not raised.  Not urticarial.  Skin intact.  Appearance c/w vasculitis.  Neurological: She is alert and oriented to person, place, and time.  Skin: Skin is warm and dry. No rash noted.  Psychiatric: She has a normal mood and affect. Judgment normal.    ED Course  Procedures (including critical care time)   Labs Reviewed  CBC  DIFFERENTIAL  PROTIME-INR  APTT   No results found.   1. Vasculitis of skin       MDM  No tx presently.  Return to ED if sxs worsen or change.        Worthy Rancher, PA 04/06/12 1306

## 2012-04-06 NOTE — ED Notes (Signed)
Rash to lower legs for 3 days.  Has taken no meds

## 2012-04-06 NOTE — Discharge Instructions (Signed)
Vasculitis Vasculitis is when your blood vessels are inflamed. There are many different blood vessels in the body, and vasculitis can affect any of them. This includes large (veins and arteries) and small (capillaries) vessels. With vasculitis,   Blood vessel walls can become thick.   Blood vessels can become narrow.   Blood vessels can become weak. Sometimes, it becomes so weak that the blood vessel bulges out like a balloon. This is called an aneurysm. Aneurysms are rare but can be life-threatening.   Scarring can occur.   Not enough blood can flow through the blood vessels.  All of these things can damage many parts of the body, including the muscles, kidneys, lungs and brain. There are many types of vasculitis. Some types are short-term (acute), while others are long-term (chronic). Some types may go away without treatment, and others may need to be treated for a long time.  CAUSES  Vasculitis occurs when the body's immune system (which fights germs and disease) makes a mistake. It attacks its own blood vessels. This causes inflammation (the body's way of reacting to injury or infection).   Why this happens is usually not known. The condition is then called primary vasculitis.   Sometimes, something triggers the inflammation. This is called secondary vasculitis. Possible causes include:   Infections.   An immune system disease. Examples include lupus, rheumatoid arthritis and scleroderma.   An allergic reaction to a medicine.   Cancer that affects blood cells. This includes leukemia and lymphoma.   Males and females of all ages and races can develop vasculitis. Some risk factors make vasculitis more likely. These include:   Smoking.   Stress.   Physical injury.  SYMPTOMS  There are more than 20 types of vasculitis. Symptoms of each type vary, but some symptoms are common.  Many people with vasculitis:   Have a fever.   Do not feel like eating.   Lose weight.   Feel  very tired.   Have aches and pains.   Feel weak.   Start to not have feeling (numbness) in an area.   Symptoms for some types of vasculitis also could be:   Sores in the mouth or eyes.   Skin problems. This could be sores, spots or rashes.   Trouble seeing.   Trouble breathing.   Blood in the urine.   Headaches.   Pain in the abdomen.   Stuffy or bloody nose.  DIAGNOSIS  Vasculitis symptoms are similar to symptoms of many other conditions. That can make it hard to tell if you have vasculitis. To be sure, your caregiver will ask about your symptoms and do a physical exam. Certain tests may be necessary, such as:   A complete blood count (CBC). This test shows how many red blood cells are in your blood. Not having enough red blood cells (anemic) can result from vasculitis.   Erythrocyte sedimentation (also called sed rate test). It measures inflammation in the body.   C reactive protein (CRP). This also shows if there is inflammation.   Anti-neutrophil cytoplasmic antibodies (ANCA). This can tell if the immune system is reacting to certain cells in the blood.   A urine test. This checks for blood or protein in the urine. That could be a sign of kidney damage from vasculitis.   Imaging tests. These tests create pictures from inside the body. Options include:   X-rays.   Computed tomography (CT) scan. This uses X-rays guided by a computer.   Ultrasound. It   creates an image using sound waves.   Magnetic resonance imaging (MRI). It uses radio waves, magnets and a computer.   Angiography. A dye is put into your blood vessels. Then, an X-ray is taken of them.   A biopsy of a blood vessel. This means your caregiver will take out a small piece of a blood vessel. Then, it is checked under a microscope. This is an important test. It often is the best way to know for sure if you have vasculitis.  TREATMENT   Treatment will depend on the type of vasculitis and how severe it is.  Often, you will need to see a specialist in immunologic diseases (rheumatologist).   Some types of vasculitis may go away without treatment.   Some types need only over-the-counter drugs.   Prescription medicines are used to treat many types of vasculitis. For example:   Corticosteroids. These are the drugs used most often. They are very powerful. Usually, a high dose is taken until symptoms improve. Then, the dose is gradually decreased. Using corticosteroids for a long time can cause problems. They can make muscles and bones weak. They can cause blood pressure to go up, and cause diabetes. Also, people often gain weight when they take corticosteroids.   Cytotoxic drugs. These kill cells that cause inflammation. Sometimes, they are used if corticosteroids do not help. Other times, both medications are taken.   Surgery. This may be needed to repair a blood vessel that has bulged out (aneurysm).   Treatment can sometimes cure your disease. Other times, it can put the disease in remission (no symptoms). Increased treatment and reevaluation might be necessary if your disease comes back or flares.  HOME CARE INSTRUCTIONS   Take any medications that your caregiver prescribes. Follow the directions carefully.   Watch for any problems that can be caused by a drug (side effects). Tell your caregiver right away if you notice any changes or problems.   Keep all appointments for checkups. This is important to help your caregiver watch for side effects. Checkups may include:   Periodic blood tests.   Bone density testing. This checks how strong or weak your bones are.   Blood pressure checks. If your blood pressure rises, you may need to take a drug to control it while you are taking corticosteroids.   Blood sugar checks. This is to be sure you are not developing diabetes. If you have diabetes, corticosteroid medications may make it worse and require increased treatment.   Exercise. First, talk  with your caregiver about what would be OK for you to do. Aerobic exercise (which increases your heart rate) is often suggested. It includes walking. This type of exercise is good because it helps prevent bone loss. It also helps control your blood pressure.   Follow a healthy diet. Include good sources of protein in your diet. Also include fruits, vegetables and whole grains. Your caregiver can refer you to an expert on healthy eating (dietitian) for more detailed advice.   Learn as much as you can about vasculitis. Understanding your condition can help you cope with it. Coping can be hard because this may be something you will have to live with for years.   Consider joining a support group. It often helps to talk about your worries with others who have the same problems.   Tell your caregiver if you feel stressed, anxious or depressed. Your caregiver may refer you to a specialist, or recommend medication to relieve your symptoms.    SEEK MEDICAL CARE IF:   The symptoms that led to your diagnosis return.   You develop worsening fever, fatigue, headache, weight loss or pain in your jaw.   You develop signs of infection. Infections can be worse if you are on corticosteroid medication.   You develop any new or unexplained symptoms of disease.  SEEK IMMEDIATE MEDICAL CARE IF:   Your eyesight changes.   Pain does not go away, even after taking medication.   You feel pain in your chest or abdomen.   You have trouble breathing.   One side of your face or body becomes suddenly weak or numb.   Your nose bleeds.   There is blood in your urine.   You develop a fever of more than 102 F (38.9 C).  Document Released: 09/15/2009 Document Revised: 11/08/2011 Document Reviewed: 09/15/2009 [facility] Patient Information 2012 [place], [place].   Your blood work is normal.  Return to the ED if your symptoms worsen or change significantly.

## 2012-04-06 NOTE — ED Notes (Signed)
Pt has small red circles noted to lower legs in various areas, pt states that the spots come and go, denies any new exposures, pt states that the areas itch and are painful.

## 2012-04-07 NOTE — ED Provider Notes (Signed)
Medical screening examination/treatment/procedure(s) were performed by non-physician practitioner and as supervising physician I was immediately available for consultation/collaboration.  Dayquan Buys, MD 04/07/12 2158 

## 2012-11-04 ENCOUNTER — Encounter (HOSPITAL_COMMUNITY): Payer: Self-pay | Admitting: *Deleted

## 2012-11-04 ENCOUNTER — Emergency Department (HOSPITAL_COMMUNITY)
Admission: EM | Admit: 2012-11-04 | Discharge: 2012-11-04 | Disposition: A | Discharge status: Home / Self Care | Payer: PRIVATE HEALTH INSURANCE | Attending: Emergency Medicine | Admitting: Emergency Medicine

## 2012-11-04 ENCOUNTER — Emergency Department (HOSPITAL_COMMUNITY): Payer: PRIVATE HEALTH INSURANCE

## 2012-11-04 DIAGNOSIS — I10 Essential (primary) hypertension: Secondary | ICD-10-CM | POA: Insufficient documentation

## 2012-11-04 DIAGNOSIS — Z79899 Other long term (current) drug therapy: Secondary | ICD-10-CM | POA: Insufficient documentation

## 2012-11-04 DIAGNOSIS — F17200 Nicotine dependence, unspecified, uncomplicated: Secondary | ICD-10-CM | POA: Insufficient documentation

## 2012-11-04 DIAGNOSIS — R110 Nausea: Secondary | ICD-10-CM | POA: Insufficient documentation

## 2012-11-04 DIAGNOSIS — R079 Chest pain, unspecified: Secondary | ICD-10-CM | POA: Insufficient documentation

## 2012-11-04 DIAGNOSIS — Z8739 Personal history of other diseases of the musculoskeletal system and connective tissue: Secondary | ICD-10-CM | POA: Insufficient documentation

## 2012-11-04 DIAGNOSIS — R42 Dizziness and giddiness: Secondary | ICD-10-CM | POA: Insufficient documentation

## 2012-11-04 LAB — BASIC METABOLIC PANEL
CO2: 22 mEq/L (ref 19–32)
Calcium: 9.3 mg/dL (ref 8.4–10.5)
Glucose, Bld: 136 mg/dL — ABNORMAL HIGH (ref 70–99)
Potassium: 3.6 mEq/L (ref 3.5–5.1)
Sodium: 138 mEq/L (ref 135–145)

## 2012-11-04 LAB — POCT I-STAT TROPONIN I: Troponin i, poc: 0 ng/mL (ref 0.00–0.08)

## 2012-11-04 LAB — CBC
Hemoglobin: 15 g/dL (ref 12.0–15.0)
MCH: 30 pg (ref 26.0–34.0)
RBC: 5 MIL/uL (ref 3.87–5.11)

## 2012-11-04 MED ORDER — TOPIRAMATE 50 MG PO TABS: 50.0000 mg | ORAL_TABLET | Freq: Two times a day (BID) | ORAL | Status: DC

## 2012-11-04 MED ORDER — ACETAMINOPHEN 325 MG PO TABS
650.0000 mg | ORAL_TABLET | Freq: Once | ORAL | Status: AC
  Administered 2012-11-04: 650 mg via ORAL
  Filled 2012-11-04: qty 2

## 2012-11-04 MED ORDER — AMLODIPINE BESYLATE 10 MG PO TABS: 10.0000 mg | ORAL_TABLET | Freq: Every day | ORAL | Status: DC

## 2012-11-04 MED ORDER — ASPIRIN 81 MG PO CHEW
324.0000 mg | CHEWABLE_TABLET | Freq: Once | ORAL | Status: AC
  Administered 2012-11-04: 324 mg via ORAL
  Filled 2012-11-04: qty 4

## 2012-11-04 NOTE — ED Provider Notes (Signed)
History     CSN: [id number]  Arrival date & time 11/04/12  1730   First MD Initiated Contact with Patient 11/04/12 1936      Chief Complaint  Patient presents with  . Chest Pain     Patient is a 56 y.o. female presenting with chest pain. The history is provided by the patient.  Chest Pain The chest pain began 6 - 12 hours ago. Chest pain occurs constantly. The chest pain is improving. Associated with: nothing. The pain is currently at 5/10. The quality of the pain is described as pressure-like. The pain does not radiate. Chest pain is worsened by certain positions (palpation). Primary symptoms include nausea and dizziness. Pertinent negatives for primary symptoms include no fever, no syncope, no shortness of breath, no cough, no abdominal pain and no vomiting.  Dizziness also occurs with nausea. Dizziness does not occur with vomiting or diaphoresis.   Pertinent negatives for associated symptoms include no diaphoresis. She tried nothing for the symptoms.   Pt reports that she started to have CP earlier this morning.  It was not exertional.  It is not pleuritic.  No SOB.  She reports mild dizziness and nausea but no vomiting. She has never had this before.  The pain is worse with palpation.   No trauma to her chest but she has been working a lot lately (she is a Engineer, civil (consulting))  She reports some neck pain, but on my eval the pain does not radiate from chest into neck.  No arm weakness/numbness  Past Medical History  Diagnosis Date  . Hypertension   . Arthritis     Past Surgical History  Procedure Date  . Knee surgery   . Abdominal hysterectomy     Family history - CAD in father  History  Substance Use Topics  . Smoking status: Current Some Day Smoker    Types: Cigarettes  . Smokeless tobacco: Not on file  . Alcohol Use: Yes     Comment: occ    OB History    Grav Para Term Preterm Abortions TAB SAB Ect Mult Living                  Review of Systems  Constitutional: Negative  for fever and diaphoresis.  Respiratory: Negative for cough and shortness of breath.   Cardiovascular: Positive for chest pain. Negative for syncope.  Gastrointestinal: Positive for nausea. Negative for vomiting and abdominal pain.  Neurological: Positive for dizziness.  All other systems reviewed and are negative.    Allergies  Ace inhibitors  Home Medications   Current Outpatient Rx  Name  Route  Sig  Dispense  Refill  . AMLODIPINE BESYLATE 10 MG PO TABS   Oral   Take 10 mg by mouth daily.         . TOPIRAMATE 50 MG PO TABS   Oral   Take 50 mg by mouth 2 (two) times daily.           BP 128/71  Pulse 91  Temp 98.1 F (36.7 C) (Oral)  Resp 20  SpO2 97%  Physical Exam CONSTITUTIONAL: Well developed/well nourished HEAD AND FACE: Normocephalic/atraumatic EYES: EOMI/PERRL ENMT: Mucous membranes moist NECK: supple no meningeal signs SPINE:entire spine nontender CV: S1/S2 noted, no murmurs/rubs/gallops noted Chest - tender to palpation but no crepitance LUNGS: Lungs are clear to auscultation bilaterally, no apparent distress ABDOMEN: soft, nontender, no rebound or guarding GU:no cva tenderness NEURO: Pt is awake/alert, moves all extremitiesx4 EXTREMITIES: pulses normal,  full ROM SKIN: warm, color normal PSYCH: no abnormalities of mood noted  ED Course  Procedures   Labs Reviewed  BASIC METABOLIC PANEL - Abnormal; Notable for the following:    Glucose, Bld 136 (*)     All other components within normal limits  CBC  POCT I-STAT TROPONIN I   Dg Chest 2 View  11/04/2012  *RADIOLOGY REPORT*  Clinical Data: Left side chest pain.  CHEST - 2 VIEW  Comparison: PA and lateral chest 12/30/2007.  Findings: Small linear focus of atelectasis is seen in the left lower lobe.  Lungs otherwise clear.  No pneumothorax or pleural effusion.  No focal bony abnormality.  IMPRESSION: Negative chest.   Original Report Authenticated By: Holley Dexter, M.D.     12:09 AM Pt with  reproducible chest pain - she reports this is the exact pain she has had today.  No SOB reported.  No diaphoresis.    She is feeling improved.  Repeat troponin/ekg after 3 hrs of monitoring is negative/unchanged.  I doubt PE.  Do not feel she needs emergent imaging/monitoring in the hospital.  HEART score is 3.   Suspicion for ACS is low.      MDM  Nursing notes including past medical history and social history reviewed and considered in documentation xrays reviewed and considered Labs/vital reviewed and considered        Date: 11/04/2012  Rate: 91  Rhythm: normal sinus rhythm  QRS Axis: normal  Intervals: normal  ST/T Wave abnormalities: normal  Conduction Disutrbances:none  Narrative Interpretation:   Old EKG Reviewed: none available at time of interpretation     Date: 11/04/2012 2104 - repeat  Rate: 68  Rhythm: normal sinus rhythm  QRS Axis: normal  Intervals: normal  ST/T Wave abnormalities: normal  Conduction Disutrbances:none  Narrative Interpretation:   Old EKG Reviewed: unchanged from prior earlier tonight    Joya Gaskins, MD 11/05/12 0011

## 2012-11-04 NOTE — ED Notes (Signed)
PT states started having left sided chest pain that radiated into neck.  At rest when symptoms started.  Pt reports some dizziness and nauseated.

## 2012-11-04 NOTE — ED Notes (Signed)
Pt c/o CP to left upper chest that radiates to neck and shoulder along with lightheadedness and nausea since this morning. Pt reports the pain has eased up a lot but she is still nauseous.

## 2012-11-04 NOTE — ED Notes (Signed)
Pt denies lightheadedness.

## 2013-01-13 ENCOUNTER — Encounter (HOSPITAL_COMMUNITY): Payer: Self-pay | Admitting: Emergency Medicine

## 2013-01-13 ENCOUNTER — Emergency Department (HOSPITAL_COMMUNITY)
Admission: EM | Admit: 2013-01-13 | Discharge: 2013-01-13 | Disposition: A | Discharge status: Home / Self Care | Payer: PRIVATE HEALTH INSURANCE | Attending: Emergency Medicine | Admitting: Emergency Medicine

## 2013-01-13 DIAGNOSIS — F17200 Nicotine dependence, unspecified, uncomplicated: Secondary | ICD-10-CM | POA: Insufficient documentation

## 2013-01-13 DIAGNOSIS — R5381 Other malaise: Secondary | ICD-10-CM | POA: Insufficient documentation

## 2013-01-13 DIAGNOSIS — Z8739 Personal history of other diseases of the musculoskeletal system and connective tissue: Secondary | ICD-10-CM | POA: Insufficient documentation

## 2013-01-13 DIAGNOSIS — J3489 Other specified disorders of nose and nasal sinuses: Secondary | ICD-10-CM | POA: Insufficient documentation

## 2013-01-13 DIAGNOSIS — J029 Acute pharyngitis, unspecified: Secondary | ICD-10-CM | POA: Insufficient documentation

## 2013-01-13 DIAGNOSIS — IMO0001 Reserved for inherently not codable concepts without codable children: Secondary | ICD-10-CM | POA: Insufficient documentation

## 2013-01-13 DIAGNOSIS — R6883 Chills (without fever): Secondary | ICD-10-CM | POA: Insufficient documentation

## 2013-01-13 DIAGNOSIS — R51 Headache: Secondary | ICD-10-CM | POA: Insufficient documentation

## 2013-01-13 DIAGNOSIS — I10 Essential (primary) hypertension: Secondary | ICD-10-CM | POA: Insufficient documentation

## 2013-01-13 DIAGNOSIS — Z79899 Other long term (current) drug therapy: Secondary | ICD-10-CM | POA: Insufficient documentation

## 2013-01-13 LAB — RAPID STREP SCREEN (MED CTR MEBANE ONLY): Streptococcus, Group A Screen (Direct): NEGATIVE

## 2013-01-13 MED ORDER — TOPIRAMATE 50 MG PO TABS: 50.0000 mg | ORAL_TABLET | Freq: Two times a day (BID) | ORAL | Status: DC

## 2013-01-13 MED ORDER — ACETAMINOPHEN 325 MG PO TABS
650.0000 mg | ORAL_TABLET | Freq: Once | ORAL | Status: AC
  Administered 2013-01-13: 650 mg via ORAL
  Filled 2013-01-13: qty 2

## 2013-01-13 MED ORDER — OXYMETAZOLINE HCL 0.05 % NA SOLN: 2.0000 | Freq: Two times a day (BID) | NASAL | Status: DC

## 2013-01-13 NOTE — ED Notes (Signed)
Pt. Stated, I've had flu-like symptoms for about a week.  Headache, sore throat , my eyes hurting and just weak all over.

## 2013-01-13 NOTE — ED Notes (Signed)
Report to Greg,RN

## 2013-01-13 NOTE — ED Provider Notes (Signed)
Medical screening examination/treatment/procedure(s) were performed by non-physician practitioner and as supervising physician I was immediately available for consultation/collaboration.   Charles B. Bernette Mayers, MD 01/13/13 [phone]

## 2013-01-13 NOTE — ED Provider Notes (Signed)
History     CSN: [id number]  Arrival date & time 01/13/13  4098   First MD Initiated Contact with Patient 01/13/13 [phone]      Chief Complaint  Patient presents with  . Influenza    (Consider location/radiation/quality/duration/timing/severity/associated sxs/prior treatment) HPI Comments: History is provided by the pt. 57 y.o. female presents today complaining of gradual onset sore throat, sinus, myalgias, and headache for the last week. Course is persistent. Pt has taken no interventions. Nothing makes it better or worse. Pt admits generalized weakness. Denies nausea, vomiting, diarrhea, constipation, dyspnea, ear pain, photophobia, numbness, visual disturbances.   PMH signficant for chronic sinus condition, smoking, and hemangioma of the left parietal as per pt.        Past Medical History  Diagnosis Date  . Hypertension   . Arthritis     Past Surgical History  Procedure Laterality Date  . Knee surgery    . Abdominal hysterectomy      No family history on file.  History  Substance Use Topics  . Smoking status: Current Some Day Smoker    Types: Cigarettes  . Smokeless tobacco: Not on file  . Alcohol Use: Yes     Comment: occ    OB History   Grav Para Term Preterm Abortions TAB SAB Ect Mult Living                  Review of Systems  Constitutional: Positive for chills, activity change and fatigue. Negative for fever and diaphoresis.  HENT: Positive for congestion, sore throat and sinus pressure. Negative for sneezing, neck pain and neck stiffness.   Eyes: Negative for visual disturbance.  Respiratory: Negative for apnea, chest tightness and shortness of breath.   Cardiovascular: Negative for chest pain and palpitations.  Gastrointestinal: Negative for nausea, vomiting, abdominal pain, diarrhea and constipation.  Genitourinary: Negative for dysuria.  Musculoskeletal: Positive for myalgias. Negative for gait problem.  Skin: Negative for rash.  Neurological:  Positive for headaches. Negative for dizziness, weakness, light-headedness and numbness.       Right sided, frontal headache.    Allergies  Ace inhibitors  Home Medications   Current Outpatient Rx  Name  Route  Sig  Dispense  Refill  . amLODipine (NORVASC) 10 MG tablet   Oral   Take 10 mg by mouth daily.         Marland Kitchen amLODipine (NORVASC) 10 MG tablet   Oral   Take 1 tablet (10 mg total) by mouth daily.   30 tablet   0   . topiramate (TOPAMAX) 50 MG tablet   Oral   Take 50 mg by mouth 2 (two) times daily.           BP 116/72  Pulse 71  Temp(Src) 97.7 F (36.5 C) (Oral)  SpO2 100%  Physical Exam  Nursing note and vitals reviewed. Constitutional: She is oriented to person, place, and time. She appears well-developed and well-nourished. No distress.  HENT:  Head: Normocephalic and atraumatic.  Right Ear: Tympanic membrane normal.  Left Ear: Tympanic membrane normal.  Nose: Mucosal edema present. No rhinorrhea. Right sinus exhibits no maxillary sinus tenderness and no frontal sinus tenderness. Left sinus exhibits no maxillary sinus tenderness and no frontal sinus tenderness.  Mouth/Throat: Oropharyngeal exudate and posterior oropharyngeal erythema present. No tonsillar abscesses.  Eyes: Conjunctivae and EOM are normal.  Neck: Normal range of motion. Neck supple.  No meningeal signs  Cardiovascular: Normal rate, regular rhythm and normal heart sounds.  Exam reveals no gallop and no friction rub.   No murmur heard. Pulmonary/Chest: Effort normal and breath sounds normal. No respiratory distress. She has no wheezes. She has no rales. She exhibits no tenderness.  Abdominal: Soft. Bowel sounds are normal. She exhibits no distension. There is no tenderness. There is no rebound and no guarding.  Musculoskeletal: Normal range of motion. She exhibits no edema and no tenderness.  Neurological: She is alert and oriented to person, place, and time. No cranial nerve deficit.  Skin:  Skin is warm and dry. She is not diaphoretic. No erythema.    ED Course  Procedures (including critical care time)  Labs Reviewed - No data to display No results found. Results for orders placed during the hospital encounter of 01/13/13  RAPID STREP SCREEN      Result Value Range   Streptococcus, Group A Screen (Direct) NEGATIVE  NEGATIVE     Diagnosis: viral pharyngitis    MDM  Sinus congestion with PMHx significant for sinus issues/smoking. Lungs clear BL, no cough, no sinus tenderness appreciated on exam. No evidence of pneumonia. Hemangioma is followed up by [facility] and has been stable for several years. Pt states feeling "feverish" but didn't actually take temp. Pt is afebrile today. Likely viral etiology. Will do rapid strep due to pt concern over strep and few tonsillar exudates, mild cervical lymphadenopathy, & dysphagia.  Rapid strep was negative. DC w symptomatic tx for pain  Pt does not appear dehydrated, but did discuss importance of water rehydration. Presentation non concerning for peritonsillar abscess or infxn spread to soft tissue. No trismus or uvula deviation. Counseled pt to follow up with PCP for chronic conditions like hypertension, smoking cessation, and sinus issues. Provided resource guide.   Glade Nurse, [place] 01/13/13 [phone]

## 2013-02-19 ENCOUNTER — Encounter (HOSPITAL_COMMUNITY): Payer: Self-pay | Admitting: *Deleted

## 2013-02-19 ENCOUNTER — Emergency Department (HOSPITAL_COMMUNITY): Payer: PRIVATE HEALTH INSURANCE

## 2013-02-19 ENCOUNTER — Emergency Department (HOSPITAL_COMMUNITY)
Admission: EM | Admit: 2013-02-19 | Discharge: 2013-02-19 | Disposition: A | Discharge status: Home / Self Care | Payer: PRIVATE HEALTH INSURANCE | Attending: Emergency Medicine | Admitting: Emergency Medicine

## 2013-02-19 DIAGNOSIS — R509 Fever, unspecified: Secondary | ICD-10-CM | POA: Insufficient documentation

## 2013-02-19 DIAGNOSIS — F17200 Nicotine dependence, unspecified, uncomplicated: Secondary | ICD-10-CM | POA: Insufficient documentation

## 2013-02-19 DIAGNOSIS — M129 Arthropathy, unspecified: Secondary | ICD-10-CM | POA: Insufficient documentation

## 2013-02-19 DIAGNOSIS — I10 Essential (primary) hypertension: Secondary | ICD-10-CM | POA: Insufficient documentation

## 2013-02-19 DIAGNOSIS — Z79899 Other long term (current) drug therapy: Secondary | ICD-10-CM | POA: Insufficient documentation

## 2013-02-19 DIAGNOSIS — R51 Headache: Secondary | ICD-10-CM | POA: Insufficient documentation

## 2013-02-19 DIAGNOSIS — R5381 Other malaise: Secondary | ICD-10-CM | POA: Insufficient documentation

## 2013-02-19 DIAGNOSIS — R05 Cough: Secondary | ICD-10-CM

## 2013-02-19 DIAGNOSIS — J3489 Other specified disorders of nose and nasal sinuses: Secondary | ICD-10-CM | POA: Insufficient documentation

## 2013-02-19 DIAGNOSIS — J029 Acute pharyngitis, unspecified: Secondary | ICD-10-CM

## 2013-02-19 DIAGNOSIS — R058 Other specified cough: Secondary | ICD-10-CM

## 2013-02-19 DIAGNOSIS — R0789 Other chest pain: Secondary | ICD-10-CM | POA: Insufficient documentation

## 2013-02-19 MED ORDER — ALBUTEROL SULFATE HFA 108 (90 BASE) MCG/ACT IN AERS
2.0000 | INHALATION_SPRAY | RESPIRATORY_TRACT | Status: DC | PRN
  Filled 2013-02-19: qty 6.7

## 2013-02-19 MED ORDER — HYDROCODONE-HOMATROPINE 5-1.5 MG/5ML PO SYRP: 5.0000 mL | ORAL_SOLUTION | Freq: Four times a day (QID) | ORAL | Status: DC | PRN

## 2013-02-19 NOTE — ED Provider Notes (Signed)
History     CSN: [id number]  Arrival date & time 02/19/13  6295   First MD Initiated Contact with Patient 02/19/13 [phone]      Chief Complaint  Patient presents with  . Cough  . Sore Throat    (Consider location/radiation/quality/duration/timing/severity/associated sxs/prior treatment) HPI Comments: Patient presents for productive cough, sore throat, and sinus headaches x 3 weeks. Patient states she has been coughing up white/yellowish phlegm intermittently and the sensation of it building up in her throat is uncomfortable. Patient states her symptoms have been unchanged since onset with no aggravating or alleviating factors. Patient admits to feeling like she has had a fever intermittently, but has never taken her temperature. Patient further admits to fatigue and chest tightness that comes and goes, primarily with the sensation that phlegm is building up in her throat. Patient has a history of "sinus issues".  The history is provided by the patient. No language interpreter was used.    Past Medical History  Diagnosis Date  . Hypertension   . Arthritis     Past Surgical History  Procedure Laterality Date  . Knee surgery    . Abdominal hysterectomy      History reviewed. No pertinent family history.  History  Substance Use Topics  . Smoking status: Current Every Day Smoker -- 1.00 packs/day    Types: Cigarettes  . Smokeless tobacco: Never Used  . Alcohol Use: Yes     Comment: occ    OB History   Grav Para Term Preterm Abortions TAB SAB Ect Mult Living                  Review of Systems  Constitutional: Positive for fatigue. Negative for chills.       + "feeling feverish"  HENT: Positive for sore throat and sinus pressure. Negative for rhinorrhea, trouble swallowing, neck pain and neck stiffness.   Eyes: Negative for visual disturbance.  Respiratory: Positive for cough and chest tightness. Negative for shortness of breath.   Cardiovascular: Negative for chest  pain.  Gastrointestinal: Negative for nausea, vomiting and abdominal pain.  Neurological: Positive for headaches. Negative for syncope, weakness and numbness.  All other systems reviewed and are negative.    Allergies  Ace inhibitors  Home Medications   Current Outpatient Rx  Name  Route  Sig  Dispense  Refill  . amLODipine (NORVASC) 10 MG tablet   Oral   Take 10 mg by mouth daily.         . cetirizine (ZYRTEC) 10 MG tablet   Oral   Take 10 mg by mouth daily.         Marland Kitchen ibuprofen (ADVIL,MOTRIN) 200 MG tablet   Oral   Take 600 mg by mouth every 6 (six) hours as needed for pain (for aches and pains.).         Marland Kitchen oxymetazoline (AFRIN NASAL SPRAY) 0.05 % nasal spray   Nasal   Place 2 sprays into the nose 2 (two) times daily.   30 mL   0   . topiramate (TOPAMAX) 50 MG tablet   Oral   Take 50 mg by mouth 2 (two) times daily.         . vitamin C (ASCORBIC ACID) 500 MG tablet   Oral   Take 500 mg by mouth daily.         Marland Kitchen HYDROcodone-homatropine (HYCODAN) 5-1.5 MG/5ML syrup   Oral   Take 5 mLs by mouth every 6 (six) hours as  needed for cough.   120 mL   0     BP 136/80  Pulse 65  Temp(Src) 97.3 F (36.3 C) (Oral)  Resp 18  SpO2 100%  Physical Exam  Nursing note and vitals reviewed. Constitutional: She is oriented to person, place, and time. She appears well-developed and well-nourished. No distress.  HENT:  Head: Normocephalic and atraumatic.  Right Ear: External ear normal.  Left Ear: External ear normal.  Mouth/Throat: Oropharynx is clear and moist. No oropharyngeal exudate.  Oropharyngeal erythema appreciated. No tonsillar enlargement or exudate. B/l ear canals and TM normal without erythema  Eyes: Conjunctivae are normal. Pupils are equal, round, and reactive to light. No scleral icterus.  Neck: Normal range of motion. Neck supple.  Cardiovascular: Normal rate, regular rhythm, normal heart sounds and intact distal pulses.   Pulmonary/Chest:  Effort normal and breath sounds normal. No respiratory distress. She has no wheezes. She has no rales.  Abdominal: Soft. She exhibits no distension. There is no tenderness.  Musculoskeletal: Normal range of motion.  Lymphadenopathy:    She has no cervical adenopathy.  Neurological: She is alert and oriented to person, place, and time.  Skin: Skin is warm and dry. She is not diaphoretic. No erythema.  Psychiatric: She has a normal mood and affect. Her behavior is normal.    ED Course  Procedures (including critical care time)  Labs Reviewed  RAPID STREP SCREEN   Dg Chest Portable 1 View  02/19/2013  *RADIOLOGY REPORT*  Clinical Data: Cough, sore throat, smoking history  PORTABLE CHEST - 1 VIEW  Comparison: Chest x-ray of 11/04/2012  Findings: No focal infiltrate or effusion is seen.  The heart is borderline enlarged.  No bony abnormality is noted.  IMPRESSION: Stable chest x-ray.  No active lung disease.   Original Report Authenticated By: Dwyane Dee, M.D.      1. Pharyngitis   2. Productive cough       MDM  Uncomplicated pharyngitis with productive cough. Patient's rapid strep negative and xray negative for acute cardiopulmonary changes. Patient to d/c home with hycodan for symptoms and PCP follow up. Saline nasal sprays such as the neti-pot also recommended for symptom improvement. Patient states comfort and understanding with this d/c plan with no unaddressed concerns.  Filed Vitals:   02/19/13 0858 02/19/13 1055  BP:  136/80  Pulse: 77 65  Temp: 97.8 F (36.6 C) 97.3 F (36.3 C)  TempSrc: Oral Oral  Resp: 18 18  SpO2: 100% 100%           Antony Madura, PA-C 02/19/13 1747

## 2013-02-19 NOTE — ED Provider Notes (Signed)
Medical screening examination/treatment/procedure(s) were performed by non-physician practitioner and as supervising physician I was immediately available for consultation/collaboration.  Flint Melter, MD 02/19/13 2125

## 2013-02-19 NOTE — ED Notes (Signed)
Pt from home with reports of a productive cough, sore throat, headache and intermittent fever over the last 3 weeks. Pt reports a feeling of "phlegm" buildup in throat that causes her to feel the need to cough and is worse on the left side. Pt denies N/V/D and shortness of breath.

## 2013-05-28 ENCOUNTER — Other Ambulatory Visit: Payer: Self-pay | Admitting: Obstetrics and Gynecology

## 2013-05-28 DIAGNOSIS — Z139 Encounter for screening, unspecified: Secondary | ICD-10-CM

## 2013-05-29 ENCOUNTER — Ambulatory Visit (HOSPITAL_COMMUNITY)
Admission: RE | Admit: 2013-05-29 | Discharge: 2013-05-29 | Disposition: A | Discharge status: Home / Self Care | Payer: PRIVATE HEALTH INSURANCE | Source: Ambulatory Visit | Attending: Obstetrics and Gynecology | Admitting: Obstetrics and Gynecology

## 2013-05-29 ENCOUNTER — Ambulatory Visit (HOSPITAL_COMMUNITY): Payer: PRIVATE HEALTH INSURANCE

## 2013-05-29 DIAGNOSIS — Z1231 Encounter for screening mammogram for malignant neoplasm of breast: Secondary | ICD-10-CM | POA: Insufficient documentation

## 2013-05-29 DIAGNOSIS — Z139 Encounter for screening, unspecified: Secondary | ICD-10-CM

## 2013-09-18 ENCOUNTER — Emergency Department (HOSPITAL_COMMUNITY): Payer: Worker's Compensation

## 2013-09-18 ENCOUNTER — Emergency Department (HOSPITAL_COMMUNITY)
Admission: EM | Admit: 2013-09-18 | Discharge: 2013-09-18 | Disposition: A | Discharge status: Home / Self Care | Payer: Worker's Compensation | Attending: Emergency Medicine | Admitting: Emergency Medicine

## 2013-09-18 ENCOUNTER — Encounter (HOSPITAL_COMMUNITY): Payer: Self-pay | Admitting: Emergency Medicine

## 2013-09-18 DIAGNOSIS — M129 Arthropathy, unspecified: Secondary | ICD-10-CM | POA: Insufficient documentation

## 2013-09-18 DIAGNOSIS — Y93F2 Activity, caregiving, lifting: Secondary | ICD-10-CM | POA: Insufficient documentation

## 2013-09-18 DIAGNOSIS — Y9210 Unspecified residential institution as the place of occurrence of the external cause: Secondary | ICD-10-CM | POA: Insufficient documentation

## 2013-09-18 DIAGNOSIS — S39012A Strain of muscle, fascia and tendon of lower back, initial encounter: Secondary | ICD-10-CM

## 2013-09-18 DIAGNOSIS — Z79899 Other long term (current) drug therapy: Secondary | ICD-10-CM | POA: Insufficient documentation

## 2013-09-18 DIAGNOSIS — F17200 Nicotine dependence, unspecified, uncomplicated: Secondary | ICD-10-CM | POA: Insufficient documentation

## 2013-09-18 DIAGNOSIS — R609 Edema, unspecified: Secondary | ICD-10-CM | POA: Insufficient documentation

## 2013-09-18 DIAGNOSIS — M62838 Other muscle spasm: Secondary | ICD-10-CM | POA: Insufficient documentation

## 2013-09-18 DIAGNOSIS — S335XXA Sprain of ligaments of lumbar spine, initial encounter: Secondary | ICD-10-CM | POA: Insufficient documentation

## 2013-09-18 DIAGNOSIS — X503XXA Overexertion from repetitive movements, initial encounter: Secondary | ICD-10-CM | POA: Insufficient documentation

## 2013-09-18 DIAGNOSIS — I10 Essential (primary) hypertension: Secondary | ICD-10-CM | POA: Insufficient documentation

## 2013-09-18 MED ORDER — MELOXICAM 15 MG PO TABS: 15.0000 mg | ORAL_TABLET | Freq: Every day | ORAL | Status: DC

## 2013-09-18 MED ORDER — HYDROCODONE-ACETAMINOPHEN 5-325 MG PO TABS: 2.0000 | ORAL_TABLET | ORAL | Status: DC | PRN

## 2013-09-18 MED ORDER — KETOROLAC TROMETHAMINE 60 MG/2ML IM SOLN
60.0000 mg | Freq: Once | INTRAMUSCULAR | Status: AC
  Administered 2013-09-18: 60 mg via INTRAMUSCULAR
  Filled 2013-09-18: qty 2

## 2013-09-18 NOTE — ED Notes (Signed)
Pt in room, returned from radiology

## 2013-09-18 NOTE — ED Provider Notes (Signed)
CSN: [id number]     Arrival date & time 09/18/13  0254 History   First MD Initiated Contact with Patient 09/18/13 0300     Chief Complaint  Patient presents with  . Back Pain   (Consider location/radiation/quality/duration/timing/severity/associated sxs/prior Treatment) HPI  Theresa Gibson is a 57 y.o.female with a significant PMH of hypertension and arthritis presents to the ER with complaints of low back pain. 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 She did not have pain at the time and continued her shift, went home and went to bed. When she woke up from he sleep she had pain. Saw the workers comp doctor who gave her a back brace and said she has a back strain. Last night while she went to work she was very uncomfortable and did not make  It through her shift coming in from work. She says the pain is 9/10. Moving around makes it better as well as the brace. The doctor Rx her Flexeril but she has not gotten it filled yet.  Nothing makes it worse. Denies radiation. Says that she has been getting spasms, sharp pains and pinches to the area. Denies hx of back pain.  No dysuria, n,v,d, fevers, abdominal pains, bowel or urine incontinence.    Past Medical History  Diagnosis Date  . Hypertension   . Arthritis    Past Surgical History  Procedure Laterality Date  . Knee surgery    . Abdominal hysterectomy     No family history on file. History  Substance Use Topics  . Smoking status: Current Every Day Smoker -- 1.00 packs/day    Types: Cigarettes  . Smokeless tobacco: Never Used  . Alcohol Use: Yes     Comment: occ   OB History   Grav Para Term Preterm Abortions TAB SAB Ect Mult Living                 Review of Systems The patient denies anorexia, fever, weight loss,, vision loss, decreased hearing, hoarseness, chest pain, syncope, dyspnea on exertion, peripheral edema, balance  deficits, hemoptysis, abdominal pain, melena, hematochezia, severe indigestion/heartburn, hematuria, incontinence, genital sores, muscle weakness, suspicious skin lesions, transient blindness, difficulty walking, depression, unusual weight change, abnormal bleeding, enlarged lymph nodes, angioedema, and breast masses.  Allergies  Ace inhibitors  Home Medications   Current Outpatient Rx  Name  Route  Sig  Dispense  Refill  . amLODipine (NORVASC) 10 MG tablet   Oral   Take 10 mg by mouth daily.         . cetirizine (ZYRTEC) 10 MG tablet   Oral   Take 10 mg by mouth daily.         Marland Kitchen topiramate (TOPAMAX) 50 MG tablet   Oral   Take 50 mg by mouth 2 (two) times daily.         Marland Kitchen HYDROcodone-acetaminophen (NORCO/VICODIN) 5-325 MG per tablet   Oral   Take 2 tablets by mouth every 4 (four) hours as needed for pain.   6 tablet   0   . ibuprofen (ADVIL,MOTRIN) 200 MG tablet   Oral   Take 600 mg by mouth every 6 (six) hours as needed for pain (for aches and pains.).         Marland Kitchen meloxicam (MOBIC) 15 MG tablet   Oral   Take 1 tablet (15 mg total) by mouth daily.  30 tablet   0    BP 132/72  Pulse 63  Temp(Src) 97.5 F (36.4 C) (Oral)  Resp 18  SpO2 100% Physical Exam  Nursing note and vitals reviewed. Constitutional: She appears well-developed and well-nourished. No distress.  HENT:  Head: Normocephalic and atraumatic.  Eyes: Pupils are equal, round, and reactive to light.  Neck: Normal range of motion. Neck supple.  Cardiovascular: Normal rate and regular rhythm.   Pulmonary/Chest: Effort normal.  Abdominal: Soft.  Musculoskeletal:       Lumbar back: She exhibits tenderness, swelling, pain and spasm. She exhibits normal range of motion, no bony tenderness, no edema, no deformity, no laceration and normal pulse.       Back:   Equal strength to bilateral lower extremities. Neurosensory function adequate to both legs. Skin color is normal. Skin is warm and moist. I see  no step off deformity, no bony tenderness. Pt is able to ambulate without limp.  ROM is decreased due to pain. No crepitus, laceration, effusion, swelling.  Pulses are normal   Neurological: She is alert.  Skin: Skin is warm and dry.    ED Course  Procedures (including critical care time) Labs Review Labs Reviewed - No data to display Imaging Review Dg Lumbar Spine Complete  09/18/2013   CLINICAL DATA:  Back pain after injury  EXAM: LUMBAR SPINE - COMPLETE 4+ VIEW  COMPARISON:  None.  FINDINGS: There is no evidence of lumbar spine fracture. Alignment is normal. No focal or advanced degenerative disc narrowing.  IMPRESSION: Negative.   Electronically Signed   By: Tiburcio Pea M.D.   On: 09/18/2013 03:32    EKG Interpretation   None       MDM   1. Strain of lumbar paraspinal muscle, initial encounter      Patient given IM 60 mg Toradol for pain. Lumbar spine xray is negative for any abnormalities of the bone or alignment. She has flexeril at home.  57 y.o.Theresa Gibson's  with back pain. No neurological deficits and normal neuro exam. Patient can walk but states is painful. No loss of bowel or bladder control. No concern for cauda equina. No fever, night sweats, weight loss, h/o cancer, IVDU. RICE protocol and pain medicine indicated and discussed with patient.   Patient Plan 1. Medications: narcotic pain medication, muscle relaxer and usual home medications  2. Treatment: rest, drink plenty of fluids, gentle stretching as discussed, alternate ice and heat  3. Follow Up: Please followup with your primary doctor for discussion of your diagnoses and further evaluation after today's visit; if you do not have a primary care doctor use the resource guide provided to find one   Vital signs are stable at discharge. Filed Vitals:   09/18/13 0259  BP: 132/72  Pulse: 63  Temp: 97.5 F (36.4 C)  Resp: 18    Patient/guardian has voiced understanding and agreed to follow-up  with the PCP or specialist.         Dorthula Matas, PA-C 09/18/13 0404

## 2013-09-18 NOTE — ED Provider Notes (Signed)
Medical screening examination/treatment/procedure(s) were performed by non-physician practitioner and as supervising physician I was immediately available for consultation/collaboration.   Layla Maw Ward, DO 09/18/13 [phone]

## 2013-09-18 NOTE — ED Notes (Signed)
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 States back did not hurt until today, pain gotten worse throughout the day. Has not taken any medication for pain.

## 2013-09-18 NOTE — ED Notes (Signed)
RN in to assess pt, pt not in room.

## 2013-11-16 ENCOUNTER — Other Ambulatory Visit: Payer: Self-pay | Admitting: Gastroenterology

## 2013-12-07 ENCOUNTER — Encounter (HOSPITAL_COMMUNITY): Payer: Self-pay | Admitting: Pharmacy Technician

## 2013-12-11 ENCOUNTER — Encounter (HOSPITAL_COMMUNITY): Payer: Self-pay | Admitting: *Deleted

## 2013-12-29 ENCOUNTER — Ambulatory Visit (HOSPITAL_COMMUNITY)
Admission: RE | Admit: 2013-12-29 | Payer: PRIVATE HEALTH INSURANCE | Source: Ambulatory Visit | Admitting: Gastroenterology

## 2013-12-29 HISTORY — DX: Nausea with vomiting, unspecified: R11.2

## 2013-12-29 HISTORY — DX: Other specified postprocedural states: Z98.890

## 2013-12-29 HISTORY — DX: Gastro-esophageal reflux disease without esophagitis: K21.9

## 2013-12-29 HISTORY — DX: Major depressive disorder, single episode, unspecified: F32.9

## 2013-12-29 HISTORY — DX: Depression, unspecified: F32.A

## 2013-12-29 SURGERY — COLONOSCOPY WITH PROPOFOL
Anesthesia: Monitor Anesthesia Care

## 2014-08-17 ENCOUNTER — Encounter (HOSPITAL_COMMUNITY): Payer: Self-pay | Admitting: Emergency Medicine

## 2014-08-17 ENCOUNTER — Emergency Department (HOSPITAL_COMMUNITY)
Admission: EM | Admit: 2014-08-17 | Discharge: 2014-08-17 | Disposition: A | Discharge status: Home / Self Care | Payer: BC Managed Care – PPO | Source: Home / Self Care

## 2014-08-17 ENCOUNTER — Emergency Department (HOSPITAL_COMMUNITY): Payer: BC Managed Care – PPO

## 2014-08-17 ENCOUNTER — Emergency Department (HOSPITAL_COMMUNITY)
Admission: EM | Admit: 2014-08-17 | Discharge: 2014-08-17 | Disposition: A | Discharge status: Home / Self Care | Payer: BC Managed Care – PPO | Attending: Emergency Medicine | Admitting: Emergency Medicine

## 2014-08-17 DIAGNOSIS — Z8719 Personal history of other diseases of the digestive system: Secondary | ICD-10-CM | POA: Diagnosis not present

## 2014-08-17 DIAGNOSIS — I10 Essential (primary) hypertension: Secondary | ICD-10-CM | POA: Insufficient documentation

## 2014-08-17 DIAGNOSIS — R002 Palpitations: Secondary | ICD-10-CM

## 2014-08-17 DIAGNOSIS — Z791 Long term (current) use of non-steroidal anti-inflammatories (NSAID): Secondary | ICD-10-CM | POA: Diagnosis not present

## 2014-08-17 DIAGNOSIS — F3289 Other specified depressive episodes: Secondary | ICD-10-CM | POA: Diagnosis not present

## 2014-08-17 DIAGNOSIS — Z79899 Other long term (current) drug therapy: Secondary | ICD-10-CM | POA: Diagnosis not present

## 2014-08-17 DIAGNOSIS — R079 Chest pain, unspecified: Secondary | ICD-10-CM

## 2014-08-17 DIAGNOSIS — F329 Major depressive disorder, single episode, unspecified: Secondary | ICD-10-CM | POA: Insufficient documentation

## 2014-08-17 DIAGNOSIS — M129 Arthropathy, unspecified: Secondary | ICD-10-CM | POA: Diagnosis not present

## 2014-08-17 DIAGNOSIS — F17200 Nicotine dependence, unspecified, uncomplicated: Secondary | ICD-10-CM

## 2014-08-17 LAB — I-STAT TROPONIN, ED: Troponin i, poc: 0 ng/mL (ref 0.00–0.08)

## 2014-08-17 LAB — CBC
HCT: 40.8 % (ref 36.0–46.0)
Hemoglobin: 13.9 g/dL (ref 12.0–15.0)
MCH: 28.9 pg (ref 26.0–34.0)
MCHC: 34.1 g/dL (ref 30.0–36.0)
MCV: 84.8 fL (ref 78.0–100.0)
Platelets: 206 10*3/uL (ref 150–400)
RBC: 4.81 MIL/uL (ref 3.87–5.11)
RDW: 14.9 % (ref 11.5–15.5)
WBC: 5.8 10*3/uL (ref 4.0–10.5)

## 2014-08-17 LAB — BASIC METABOLIC PANEL
Anion gap: 12 (ref 5–15)
BUN: 12 mg/dL (ref 6–23)
CO2: 24 mEq/L (ref 19–32)
Calcium: 9.2 mg/dL (ref 8.4–10.5)
Chloride: 105 mEq/L (ref 96–112)
Creatinine, Ser: 0.56 mg/dL (ref 0.50–1.10)
GFR calc Af Amer: >90 mL/min (ref >90)
GFR calc non Af Amer: >90 mL/min (ref >90)
Glucose, Bld: 104 mg/dL — ABNORMAL HIGH (ref 70–99)
Potassium: 4 mEq/L (ref 3.7–5.3)
Sodium: 141 mEq/L (ref 137–147)

## 2014-08-17 MED ORDER — ASPIRIN 325 MG PO TABS: 325.0000 mg | ORAL_TABLET | ORAL | Status: DC

## 2014-08-17 MED ORDER — ASPIRIN 81 MG PO CHEW
243.0000 mg | CHEWABLE_TABLET | Freq: Once | ORAL | Status: AC
  Administered 2014-08-17: 243 mg via ORAL
  Filled 2014-08-17: qty 3

## 2014-08-17 MED ORDER — NITROGLYCERIN 0.4 MG SL SUBL: 0.4000 mg | SUBLINGUAL_TABLET | SUBLINGUAL | Status: DC | PRN

## 2014-08-17 NOTE — ED Notes (Signed)
Pt reports tat last night she began experiencing heart fluttering with a dull pain which radiates to her left chest. Pt reports that she took tums with no relief. NAD at this time.

## 2014-08-17 NOTE — ED Provider Notes (Signed)
CSN: [id number]     Arrival date & time 08/17/14  1011 History   First MD Initiated Contact with Patient 08/17/14 1055     Chief Complaint  Patient presents with  . Chest Pain     (Consider location/radiation/quality/duration/timing/severity/associated sxs/prior Treatment) HPI Pt is a 58yo female with hx of HTN and GERD presenting to ED with c/o centralized chest discomfort associated with sensation of heart fluttering. Symptoms have been intermittent for "a few weeks" but worsened last night. Pain described as dull, 2/10 at worst, states "I know it's there but it doesn't really hurt" reports taking 81mg  aspirin PTA as well as Tums w/o relief.  Pain radiates to left side of chest under her breast.  Denies diaphoresis, nausea or vomiting with chest pain. Denies recent illness of cough, congestion, fever or chills. No n/v/d. Denies personal hx of CAD. Pt does report being a daily smoker for 30 years and reports her father died of an MI but is unsure how old he was.    Past Medical History  Diagnosis Date  . Hypertension   . Arthritis   . Depression   . GERD (gastroesophageal reflux disease)   . PONV (postoperative nausea and vomiting)     nausea only   Past Surgical History  Procedure Laterality Date  . Knee surgery Bilateral     arthroscopy  . Abdominal hysterectomy     No family history on file. History  Substance Use Topics  . Smoking status: Current Every Day Smoker -- 1.00 packs/day for 30 years    Types: Cigarettes  . Smokeless tobacco: Never Used  . Alcohol Use: Yes     Comment: occ   OB History   Grav Para Term Preterm Abortions TAB SAB Ect Mult Living                 Review of Systems  Constitutional: Negative for fever, chills, diaphoresis and fatigue.  Respiratory: Negative for cough, chest tightness, shortness of breath and wheezing.   Cardiovascular: Positive for chest pain (centralized) and palpitations ( "fluttering").  Gastrointestinal: Negative for nausea,  vomiting and abdominal pain.  All other systems reviewed and are negative.     Allergies  Ace inhibitors  Home Medications   Prior to Admission medications   Medication Sig Start Date End Date Taking? Authorizing Provider  amLODipine (NORVASC) 10 MG tablet Take 10 mg by mouth every morning.    Yes Historical Provider, MD  buPROPion (WELLBUTRIN XL) 150 MG 24 hr tablet Take 150 mg by mouth every morning.   Yes Historical Provider, MD  cetirizine (ZYRTEC) 10 MG tablet Take 10 mg by mouth daily.   Yes Historical Provider, MD  naproxen sodium (ANAPROX) 220 MG tablet Take 220 mg by mouth 2 (two) times daily as needed (for pain).   Yes Historical Provider, MD  topiramate (TOPAMAX) 50 MG tablet Take 50 mg by mouth 2 (two) times daily.   Yes Historical Provider, MD   BP 134/82  Pulse 72  Temp(Src) 98.5 F (36.9 C) (Oral)  Resp 18  Ht 5\' 4"  (1.626 m)  Wt 225 lb (102.059 kg)  BMI 38.60 kg/m2  SpO2 99% Physical Exam  Nursing note and vitals reviewed. Constitutional: She appears well-developed and well-nourished. No distress.  HENT:  Head: Normocephalic and atraumatic.  Eyes: Conjunctivae are normal. No scleral icterus.  Neck: Normal range of motion.  Cardiovascular: Normal rate, regular rhythm and normal heart sounds.   Regular rate and rhythm  Pulmonary/Chest: Effort  normal and breath sounds normal. No respiratory distress. She has no wheezes. She has no rales. She exhibits no tenderness.  No respiratory distress, able to speak in full sentences w/o difficulty. Lungs: CTAB  Abdominal: Soft. Bowel sounds are normal. She exhibits no distension and no mass. There is no tenderness. There is no rebound and no guarding.  Musculoskeletal: Normal range of motion.  Neurological: She is alert.  Skin: Skin is warm and dry. She is not diaphoretic.    ED Course  Procedures (including critical care time) Labs Review Labs Reviewed  BASIC METABOLIC PANEL - Abnormal; Notable for the following:     Glucose, Bld 104 (*)    All other components within normal limits  CBC  I-STAT TROPOININ, ED    Imaging Review Dg Chest Port 1 View  08/17/2014   CLINICAL DATA:  Mid chest pain with fluttering sensation. History of tobacco use and hypertension  EXAM: PORTABLE CHEST - 1 VIEW  COMPARISON:  Portable chest x-ray of February 19, 2013  FINDINGS: The lungs are adequately inflated. There is no focal infiltrate. The cardiac silhouette is top-normal in size. The pulmonary vascularity is prominent centrally. There is no pleural effusion. The bony thorax is unremarkable.  IMPRESSION: There is no definite acute cardiopulmonary abnormality. Prominence of the central pulmonary vascularity is accentuated by the AP portable technique. A followup PA and lateral chest x-ray would be useful.   Electronically Signed   By: David  Martinique   On: 08/17/2014 11:27     EKG Interpretation None      MDM   Final diagnoses:  Chest pain, unspecified chest pain type  Palpitations  Current smoker    Pt is a 58yo female, daily smoker, presenting to ED with c/o mild centralized chest pain, associated with palpitations. Symptoms have been intermittent for several weeks, worsened last night.   PERC negative, pt is low risk for ACS, however will get cardiac workup.  Not concerned for pneumothorax or pneumonia. Not concerned for other emergent process taking place at this time. Cardiac workup: unremarkable.  Will discharge pt home to f/u with PCP, may need referral to cardiologist for holter monitor if palpitations persist.  Pt education for smoking cessation and community resource guide provided. Return precautions provided. Pt verbalized understanding and agreement with tx plan.  Discussed pt with Dr. Alvino Chapel who agrees with tx plan.     Noland Fordyce, PA-C 08/17/14 1234

## 2014-08-17 NOTE — Discharge Instructions (Signed)
°Emergency Department Resource Guide °1) Find a Doctor and Pay Out of Pocket °Although you won't have to find out who is covered by your insurance plan, it is a good idea to ask around and get recommendations. You will then need to call the office and see if the doctor you have chosen will accept you as a new patient and what types of options they offer for patients who are self-pay. Some doctors offer discounts or will set up payment plans for their patients who do not have insurance, but you will need to ask so you aren't surprised when you get to your appointment. ° °2) Contact Your Local Health Department °Not all health departments have doctors that can see patients for sick visits, but many do, so it is worth a call to see if yours does. If you don't know where your local health department is, you can check in your phone book. The CDC also has a tool to help you locate your state's health department, and many state websites also have listings of all of their local health departments. ° °3) Find a Walk-in Clinic °If your illness is not likely to be very severe or complicated, you may want to try a walk in clinic. These are popping up all over the country in pharmacies, drugstores, and shopping centers. They're usually staffed by nurse practitioners or physician assistants that have been trained to treat common illnesses and complaints. They're usually fairly quick and inexpensive. However, if you have serious medical issues or chronic medical problems, these are probably not your best option. ° °No Primary Care Doctor: °- Call [facility] at  832-8000 - they can help you locate a primary care doctor that  accepts your insurance, provides certain services, etc. °- Physician Referral Service- 1-800-533-3463 ° °Chronic Pain Problems: °Organization         Address  Phone   Notes  °[facility]  (336) 297-2271 Patients need to be referred by their primary care doctor.  ° °Medication  Assistance: °Organization         Address  Phone   Notes  °[place] [facility] [address] °[place], [place] [address] (336) 641-8030 --Must be a resident of [place] °-- Must have NO insurance coverage whatsoever (no Medicaid/ Medicare, etc.) °-- The pt. MUST have a primary care doctor that directs their care regularly and follows them in the community °  °[facility]  (866) 331-1348   °[employer]  (888) 892-1162   ° °Agencies that provide inexpensive medical care: °Organization         Address  Phone   Notes  °[facility]  (336) 832-8035   °[facility]    (336) 832-7272   °[facility] [address] °[place], [place] [address] (336) 832-4777   °[facility] [address], °[place] (336) 271-4999   °[facility]    (336) 373-0678   °[facility]    (336) 272-1050   °[facility] ° [address], [place] Phone:  (336) 832-4444, Fax:  (336) 832-4440 Hours of Operation:  9 am - 6 pm, M-F.  Also accepts Medicaid/Medicare and self-pay.  °[facility] ° [address], [place] Phone: (336) 832-3150, Fax: (336) 832-3151. Hours of Operation:  8:30 am - 5:30 pm, M-F.  Also accepts Medicaid and self-pay.  °[facility] [place] [address], [place] Phone: (336) 878-6027   °[facility] [address], [place], [place] (336)723-1848, Ext. 123 Mondays & Thursdays: 7-9 AM.  First 15 patients are seen on a first come, first serve basis. °  ° °Medicaid-accepting [place] Providers: ° °Organization         Address  Phone   Notes  °[facility] [address], [place] (336) 641-2100 Also accepts self-pay patients.  °[facility] [address], [place] ° (336) 856-9996   °[facility] [address], [place]  (336) 288-8857   °[facility] [address] [place] [address], [place] (336) 299-7000   °Veita Bland [address], [place]  ° (336) 373-1557 Only accepts [place] Access Medicaid patients after they have their name applied to their card.  ° °Self-Pay (no insurance) in [place]: ° °Organization         Address  Phone   Notes  °Sickle Cell Patients, [facility] [address], [place] (336) 832-1970   °[facility] [address], [place] (336) 832-4400   °[facility] ° [address], [place] (336) 992-4800   °[facility]/Dr. Osei-Bonsu ° [address] [place] [address], [place] or [address], [place] (336) 841-8500 Phone number for both [place] and [place] locations is the same.  °[facility] [address], [place] (336) 299-0000   °[facility] [address] [place] [address], [place] or [address] (336) 852-7530 °(336) 878-2260   °[facility] [address], [place] (336) 350-1642, phone; (336) 294-5005, fax Sees patients 1st and 3rd Saturday of every month.  Must not qualify for public or private insurance (i.e. Medicaid, Medicare, [place] Health Choice, Veterans' Benefits) • Household income should be no more than 200% of the poverty level •The clinic cannot treat you if you are pregnant or think you are pregnant • Sexually transmitted diseases are not treated at the clinic.  ° ° °Dental Care: °Organization         Address  Phone  Notes  °[place] [facility] [address], [place] (336) 641-6152 Accepts children up to age 21 who are enrolled in Medicaid or [place] Health Choice; pregnant women with a Medicaid card; and children who have applied for Medicaid or [place] Health Choice, but were declined, whose parents can pay a reduced fee at time of service.  °[place]  [facility] [place]  [address], [place] (336) 641-7733 Accepts children up to age 21 who are enrolled in Medicaid or [place] Health Choice; pregnant women with a Medicaid card; and children who have applied for Medicaid or [place] Health Choice, but were declined, whose parents can pay a reduced fee at time of service.  °[facility] ° [address],  (336) 641-4533 Patients are seen by appointment only. Walk-ins are not accepted. [facility] will see patients 18 years of age and older. °Monday - Tuesday (8am-5pm) °Most Wednesdays (8:30-5pm) °$30 per visit, cash only  °[facility] ° [address], [place] (336) 641-4533 Patients are seen by appointment only. Walk-ins are not accepted. [facility] will see patients 18 years of age and older. °One   Wednesday Evening (Monthly: Volunteer Based).  $30 per visit, cash only  °[facility]  (919) 537-3737 for adults; Children under age 4, call Graduate Pediatric Dentistry at (919) [phone]. Children aged 4-14, please call (919) [phone] to request a pediatric application. ° Dental services are provided in all areas of dental care including fillings, crowns and bridges, complete and partial dentures, implants, gum treatment, root canals, and extractions. Preventive care is also provided. Treatment is provided to both adults and children. °Patients are selected via a lottery and there is often a waiting list. °  °[facility] [address], °[place] ° (336) 763-8833 [email or web address] °  °[facility] [address], [place], [place] (336)723-1848, Ext. 123 Second and Fourth Thursday of each month, opens at 6:30 AM; Clinic ends at 9 AM.  Patients are seen on a first-come first-served basis, and a limited number are seen during each clinic.  ° °[facility] ° [address], [place], [place] (336) 723-7904    Eligibility Requirements °You must have lived in [place], [place], or [place] counties for at least the last three months. °  You cannot be eligible for state or federal sponsored healthcare insurance, including Veterans Administration, Medicaid, or Medicare. °  You generally cannot be eligible for healthcare insurance through your employer.  °  How to apply: °Eligibility screenings are held every Tuesday and Wednesday afternoon from 1:00 pm until 4:00 pm. You do not need an appointment for the interview!  °[facility] [address], [place], [place] 336-631-2330   °[facility] [facility]  336-342-8273   °[place]  336-703-3100   ° [facility]  336-570-6415   ° °Behavioral Health Resources in the Community: °Intensive Outpatient Programs °Organization         Address  Phone  Notes  °[facility] [address], [place], [place] 336-878-6098   °[facility] [address], [place], [place] 336-832-9800   °[facility] [address], [place], [place] ° 336-882-2125   °[facility] [address],  °[place], [place] 1-800-853-5163 or 336-641-4981   °Substance Abuse Resources °Organization         Address  Phone  Notes  °[facility]  336-882-2125   °[facility]  336-784-9470   °[facility]  336-285-9073   °[facility]  336-845-3988   °Residential & Outpatient Substance Abuse Program  1-800-659-3381   °Psychological Services °Organization         Address  Phone  Notes  °[facility]  336- 832-9600   °[facility]  336- 378-7881   °[facility] [address], [place] 1-800-853-5163 or 336-641-4981   ° °Mobile Crisis Teams °Organization         Address  Phone  Notes  °[facility], Mobile Crisis Care Unit  1-877-626-1772   °Assertive °[facility] ° [address]  [place], [place] 336-834-9664   °Sharon DeEsch [address] °[place] 336-554-5454   ° °Self-Help/Support Groups °Organization         Address  Phone             Notes  °[facility]  - variety of support groups  336- 373-1402 Call for more information  °Narcotics Anonymous (NA), [facility] [address], °[place]  2 meetings at this location  ° °  Residential Treatment Programs °Organization         Address  Phone  Notes  °[facility] [address],    °[place]  1-866-801-8205   °[facility] ° [address], [place], [place] 704-293-8524   °[facility] [address], [place] 336-845-3988 Admissions: 8am-3pm M-F  °[facility] [address]    °[place], [place] 336-841-1104   °[facility] [address], [place], [place] 336-379-7146   °[facility] [address]  °[place], [place] 336-285-9073   °[facility] Intensive Outpatient [address], [place], [place] 336-852-3033   °[facility] [address]  °[place], [place] 1-877-615-2722 or 336-784-9470   °[facility] ([facility] [address] [place], [place] 336-227-7417 Accepts Medicaid  °[facility] [address]  °[place] 1-800-659-3381 Substance Abuse/Addiction Treatment  ° °[place] Behavioral Health Resources °Organization         Address  Phone  Notes  °[facility]  (888) 581-9988   °Julie Brannon, PhD [address] [place], [place]   (336) 349-5553 or (336) 951-0000   °[facility]   [address] °[place], [place] (336) 349-4454   °[facility] [address], [place], [place] (336) 342-8316 Insurance/Medicaid/sponsorship through [facility]  °[facility] [address]                                    [place], [place] (336) 342-8316 Therapy/tele-psych/case    °[facility] [address]  ° [place], [place] (336) 349-2233    °Dr. Arfeen  (336) 349-4544   °[facility] [place]  [employer] [place] [facility] 1) [address], [place] °2) [address], [place] °3)  [address], [place] (336) 349-3220 °(336) 342-7768 ° °(336) 342-8140   °[place] [facility] (336) 342-1394 or (336) 342-3537 (After Hours)    ° ° °

## 2014-08-18 NOTE — ED Provider Notes (Signed)
Medical screening examination/treatment/procedure(s) were performed by non-physician practitioner and as supervising physician I was immediately available for consultation/collaboration.   EKG Interpretation   Date/Time:  Tuesday August 17 2014 10:20:49 EDT Ventricular Rate:  83 PR Interval:  139 QRS Duration: 78 QT Interval:  384 QTC Calculation: 451 R Axis:   6 Text Interpretation:  Sinus rhythm Atrial premature complex Probable left  atrial enlargement Confirmed by Alvino Chapel  MD, Denitra Donaghey [phone]) on 08/18/2014  2:57:51 PM       Jasper Riling. Alvino Chapel, MD 08/18/14 [phone]

## 2014-08-24 NOTE — Discharge Planning (Signed)
[facility] Liaison was not able to see patient, [facility] orange card information will be sent to the address provided.

## 2014-09-07 ENCOUNTER — Ambulatory Visit (INDEPENDENT_AMBULATORY_CARE_PROVIDER_SITE_OTHER): Payer: BC Managed Care – PPO | Admitting: Family

## 2014-09-07 ENCOUNTER — Telehealth: Payer: Self-pay | Admitting: Family

## 2014-09-07 ENCOUNTER — Other Ambulatory Visit (INDEPENDENT_AMBULATORY_CARE_PROVIDER_SITE_OTHER): Payer: BC Managed Care – PPO

## 2014-09-07 ENCOUNTER — Encounter: Payer: Self-pay | Admitting: Family

## 2014-09-07 VITALS — BP 124/70 | HR 90 | Temp 98.2°F | Ht 64.0 in | Wt 225.0 lb

## 2014-09-07 DIAGNOSIS — F329 Major depressive disorder, single episode, unspecified: Secondary | ICD-10-CM

## 2014-09-07 DIAGNOSIS — I10 Essential (primary) hypertension: Secondary | ICD-10-CM

## 2014-09-07 DIAGNOSIS — F32A Depression, unspecified: Secondary | ICD-10-CM

## 2014-09-07 DIAGNOSIS — R002 Palpitations: Secondary | ICD-10-CM | POA: Insufficient documentation

## 2014-09-07 DIAGNOSIS — Z23 Encounter for immunization: Secondary | ICD-10-CM

## 2014-09-07 LAB — CBC
HEMATOCRIT: 41.3 % (ref 36.0–46.0)
HEMOGLOBIN: 14 g/dL (ref 12.0–15.0)
MCHC: 33.9 g/dL (ref 30.0–36.0)
MCV: 86.1 fl (ref 78.0–100.0)
PLATELETS: 214 10*3/uL (ref 150.0–400.0)
RBC: 4.8 Mil/uL (ref 3.87–5.11)
RDW: 14.4 % (ref 11.5–15.5)
WBC: 7 10*3/uL (ref 4.0–10.5)

## 2014-09-07 LAB — LIPID PANEL
Cholesterol: 179 mg/dL (ref 0–200)
HDL: 48.7 mg/dL (ref >39.00)
LDL Cholesterol: 120 mg/dL — ABNORMAL HIGH (ref 0–99)
NONHDL: 130.3
Total CHOL/HDL Ratio: 4
Triglycerides: 52 mg/dL (ref 0.0–149.0)
VLDL: 10.4 mg/dL (ref 0.0–40.0)

## 2014-09-07 LAB — BASIC METABOLIC PANEL
BUN: 14 mg/dL (ref 6–23)
CHLORIDE: 106 meq/L (ref 96–112)
CO2: 25 meq/L (ref 19–32)
CREATININE: 0.7 mg/dL (ref 0.4–1.2)
Calcium: 9.3 mg/dL (ref 8.4–10.5)
GFR: 104.31 mL/min (ref >60.00)
Glucose, Bld: 95 mg/dL (ref 70–99)
POTASSIUM: 3.8 meq/L (ref 3.5–5.1)
Sodium: 138 mEq/L (ref 135–145)

## 2014-09-07 LAB — TSH: TSH: 0.88 u[IU]/mL (ref 0.35–4.50)

## 2014-09-07 MED ORDER — BUPROPION HCL ER (XL) 150 MG PO TB24: 150.0000 mg | ORAL_TABLET | Freq: Every morning | ORAL | Status: DC

## 2014-09-07 MED ORDER — AMLODIPINE BESYLATE 5 MG PO TABS: 5.0000 mg | ORAL_TABLET | Freq: Every day | ORAL | Status: DC

## 2014-09-07 MED ORDER — TOPIRAMATE 50 MG PO TABS: 50.0000 mg | ORAL_TABLET | Freq: Two times a day (BID) | ORAL | Status: DC

## 2014-09-07 NOTE — Assessment & Plan Note (Addendum)
Currently experiencing depressed mood secondary to family concerns. Restart Wellbutrin per patient's request. Will follow up during CPE in about 1 month or sooner. Instructed to go to ED if thoughts of suicide or homicide develop.

## 2014-09-07 NOTE — Assessment & Plan Note (Signed)
Currently maintained on Amlodipine 5mg . Continue current medications. Rx re-filled.

## 2014-09-07 NOTE — Progress Notes (Signed)
Subjective:    Patient ID: Theresa Gibson, female    DOB: [birth year], 58 y.o.   MRN: [id number]  HPI:  Theresa Gibson is a 58 y.o. female with multiple medical problems who presents today to establish care.   1) HTN: Stable on 5 mg of amlodipine.  Denies chest pain, shortness of breath, denies heart palpitations/fluttering.  BP Readings from Last 3 Encounters:  09/07/14 124/70  08/17/14 134/82  09/18/13 132/72     2) Palpitations: Recently seen in the emergency room for chest pain. Diagnosed with palpitations. Denies any palpitations since this event.    3) Depression- currently on Wellbutrin. Diagnosed years ago and had tried other antidepressants (Celexa, Cymbalta, Zoloft). Describes currently feeling depressed secondary to problems with grown daughter.  Denies thoughts of suicidal or homicidal ideations. Current episode is related to issues with her children. Stopped taking wellbutrin but would like to restart.   4) Headaches -  Related to meningoma. Previously followed by [facility]. Currently taking topomax to maintain. Has not been to follow-up in a couple of years and is due.   Allergies  Allergen Reactions  . Ace Inhibitors     REACTION: Dry cough    Current Outpatient Prescriptions on File Prior to Visit  Medication Sig Dispense Refill  . amLODipine (NORVASC) 10 MG tablet Take 10 mg by mouth every morning.       Marland Kitchen buPROPion (WELLBUTRIN XL) 150 MG 24 hr tablet Take 150 mg by mouth every morning.      . cetirizine (ZYRTEC) 10 MG tablet Take 10 mg by mouth daily.      . naproxen sodium (ANAPROX) 220 MG tablet Take 220 mg by mouth 2 (two) times daily as needed (for pain).      . topiramate (TOPAMAX) 50 MG tablet Take 50 mg by mouth 2 (two) times daily.       No current facility-administered medications on file prior to visit.    Past Medical History  Diagnosis Date  . Hypertension   . Arthritis   . Depression   . GERD (gastroesophageal reflux  disease)   . PONV (postoperative nausea and vomiting)     nausea only   Family History  Problem Relation Age of Onset  . Hypertension Mother   . Arthritis Mother   . Heart attack Father     In his 89's   Review of Systems  Constitutional: Negative for fever, chills, fatigue and unexpected weight change.  HENT: Negative for congestion, ear pain, sinus pressure and sore throat.   Eyes: Negative for photophobia and redness.  Respiratory: Negative for cough and chest tightness.   Cardiovascular: Negative for chest pain, palpitations and leg swelling.  Gastrointestinal: Negative for nausea, diarrhea, constipation and abdominal distention.  Endocrine: Negative for cold intolerance, heat intolerance, polydipsia, polyphagia and polyuria.  Genitourinary: Negative for urgency, frequency and difficulty urinating.  Skin: Negative for color change.  Neurological: Positive for headaches. Negative for dizziness.        Objective:    BP 124/70  Pulse 90  Temp(Src) 98.2 F (36.8 C) (Oral)  Ht 5\' 4"  (1.626 m)  Wt 225 lb (102.059 kg)  BMI 38.60 kg/m2  SpO2 97% Nursing note and vital signs reviewed.  Physical Exam  Constitutional: She is oriented to person, place, and time. She appears well-developed. No distress.  Cardiovascular: Normal rate, regular rhythm and normal heart sounds.   Pulmonary/Chest: Effort normal and breath sounds normal.  Neurological: She  is alert and oriented to person, place, and time.  Psychiatric: She has a normal mood and affect. Her behavior is normal. Judgment and thought content normal.        Assessment & Plan:

## 2014-09-07 NOTE — Telephone Encounter (Signed)
Please notify patient that overall her labs looked good. The only number that was slightly high was her LDL level at 120, the goal being <100. This is not a level where a medication is needed. The recommendation is use diet and exercise to help reduce levels. This includes getting about 30 minutes of exercise daily and increasing her fruit and vegetable intake. We can discuss her labs during her upcoming physical or sooner if she would like.

## 2014-09-07 NOTE — Assessment & Plan Note (Addendum)
Appears stable at present. Denies any palpitations/flutters since initial event. EKG obtained in office appears normal with NSR. Will consider cardiology consult or halter monitoring if another episode occurs.

## 2014-09-07 NOTE — Patient Instructions (Signed)
Thank you for choosing [facility].  Summary/Instructions:   Your medications refills have been sent to your pharmacy  Please stop at the lab to have your blood drawn before you leave.  Please make an appointment at the front to set up your physical exam at your next convenience.   I look forward to working with you.   Smoking Cessation Quitting smoking is important to your health and has many advantages. However, it is not always easy to quit since nicotine is a very addictive drug. Oftentimes, people try 3 times or more before being able to quit. This document explains the best ways for you to prepare to quit smoking. Quitting takes hard work and a lot of effort, but you can do it. ADVANTAGES OF QUITTING SMOKING  You will live longer, feel better, and live better.  Your body will feel the impact of quitting smoking almost immediately.  Within 20 minutes, blood pressure decreases. Your pulse returns to its normal level.  After 8 hours, carbon monoxide levels in the blood return to normal. Your oxygen level increases.  After 24 hours, the chance of having a heart attack starts to decrease. Your breath, hair, and body stop smelling like smoke.  After 48 hours, damaged nerve endings begin to recover. Your sense of taste and smell improve.  After 72 hours, the body is virtually free of nicotine. Your bronchial tubes relax and breathing becomes easier.  After 2 to 12 weeks, lungs can hold more air. Exercise becomes easier and circulation improves.  The risk of having a heart attack, stroke, cancer, or lung disease is greatly reduced.  After 1 year, the risk of coronary heart disease is cut in half.  After 5 years, the risk of stroke falls to the same as a nonsmoker.  After 10 years, the risk of lung cancer is cut in half and the risk of other cancers decreases significantly.  After 15 years, the risk of coronary heart disease drops, usually to the level of a  nonsmoker.  If you are pregnant, quitting smoking will improve your chances of having a healthy baby.  The people you live with, especially any children, will be healthier.  You will have extra money to spend on things other than cigarettes. QUESTIONS TO THINK ABOUT BEFORE ATTEMPTING TO QUIT You may want to talk about your answers with your health care provider.  Why do you want to quit?  If you tried to quit in the past, what helped and what did not?  What will be the most difficult situations for you after you quit? How will you plan to handle them?  Who can help you through the tough times? Your family? Friends? A health care provider?  What pleasures do you get from smoking? What ways can you still get pleasure if you quit? Here are some questions to ask your health care provider:  How can you help me to be successful at quitting?  What medicine do you think would be best for me and how should I take it?  What should I do if I need more help?  What is smoking withdrawal like? How can I get information on withdrawal? GET READY  Set a quit date.  Change your environment by getting rid of all cigarettes, ashtrays, matches, and lighters in your home, car, or work. Do not let people smoke in your home.  Review your past attempts to quit. Think about what worked and what did not. GET SUPPORT AND ENCOURAGEMENT  You have a better chance of being successful if you have help. You can get support in many ways.  Tell your family, friends, and coworkers that you are going to quit and need their support. Ask them not to smoke around you.  Get individual, group, or telephone counseling and support. Programs are available at [employer] and health centers. Call your local health department for information about programs in your area.  Spiritual beliefs and practices may help some smokers quit.  Download a "quit meter" on your computer to keep track of quit statistics, such as how  long you have gone without smoking, cigarettes not smoked, and money saved.  Get a self-help book about quitting smoking and staying off tobacco. Davis yourself from urges to smoke. Talk to someone, go for a walk, or occupy your time with a task.  Change your normal routine. Take a different route to work. Drink tea instead of coffee. Eat breakfast in a different place.  Reduce your stress. Take a hot bath, exercise, or read a book.  Plan something enjoyable to do every day. Reward yourself for not smoking.  Explore interactive web-based programs that specialize in helping you quit. GET MEDICINE AND USE IT CORRECTLY Medicines can help you stop smoking and decrease the urge to smoke. Combining medicine with the above behavioral methods and support can greatly increase your chances of successfully quitting smoking.  Nicotine replacement therapy helps deliver nicotine to your body without the negative effects and risks of smoking. Nicotine replacement therapy includes nicotine gum, lozenges, inhalers, nasal sprays, and skin patches. Some may be available over-the-counter and others require a prescription.  Antidepressant medicine helps people abstain from smoking, but how this works is unknown. This medicine is available by prescription.  Nicotinic receptor partial agonist medicine simulates the effect of nicotine in your brain. This medicine is available by prescription. Ask your health care provider for advice about which medicines to use and how to use them based on your health history. Your health care provider will tell you what side effects to look out for if you choose to be on a medicine or therapy. Carefully read the information on the package. Do not use any other product containing nicotine while using a nicotine replacement product.  RELAPSE OR DIFFICULT SITUATIONS Most relapses occur within the first 3 months after quitting. Do not be discouraged  if you start smoking again. Remember, most people try several times before finally quitting. You may have symptoms of withdrawal because your body is used to nicotine. You may crave cigarettes, be irritable, feel very hungry, cough often, get headaches, or have difficulty concentrating. The withdrawal symptoms are only temporary. They are strongest when you first quit, but they will go away within 10-14 days. To reduce the chances of relapse, try to:  Avoid drinking alcohol. Drinking lowers your chances of successfully quitting.  Reduce the amount of caffeine you consume. Once you quit smoking, the amount of caffeine in your body increases and can give you symptoms, such as a rapid heartbeat, sweating, and anxiety.  Avoid smokers because they can make you want to smoke.  Do not let weight gain distract you. Many smokers will gain weight when they quit, usually less than 10 pounds. Eat a healthy diet and stay active. You can always lose the weight gained after you quit.  Find ways to improve your mood other than smoking. FOR MORE INFORMATION  www.smokefree.gov  Document Released: 11/13/2001 Document  Revised: 04/05/2014 Document Reviewed: 02/28/2012 [facility] Patient Information 2015 [place], [place]. This information is not intended to replace advice given to you by your health care provider. Make sure you discuss any questions you have with your health care provider.  Thank you for enrolling in [place]. Please follow the instructions below to securely access your online medical record. MyChart allows you to send messages to your doctor, view your test results, renew your prescriptions, schedule appointments, and more.  How Do I Sign Up? 1. In your Internet browser, go to [email or web address]. 2. Click on the New  User? link in the Sign In box.  3. Enter your MyChart Access Code exactly as it appears below. You will not need to use this code after you have completed the sign-up  process. If you do not sign up before the expiration date, you must request a new code. MyChart Access Code: [email or web address] Expires: 10/16/2014 12:17 PM  4. Enter the last four digits of your Social Security Number (xxxx) and Date of Birth (mm/dd/yyyy) as indicated and click Next. You will be taken to the next sign-up page. 5. Create a MyChart ID. This will be your MyChart login ID and cannot be changed, so think of one that is secure and easy to remember. 6. Create a MyChart password. You can change your password at any time. 7. Enter your Password Reset Question and Answer and click Next. This can be used at a later time if you forget your password.  8. Select your communication preference, and if applicable enter your e-mail address. You will receive e-mail notification when new information is available in MyChart by choosing to receive e-mail notifications and filling in your e-mail. 9. Click Sign In. You can now view your medical record.   Additional Information If you have questions, you can email [email or web address] or call [phone] to talk to our [facility] staff. Remember, MyChart is NOT to be used for urgent needs. For medical emergencies, dial 911.

## 2014-09-07 NOTE — Progress Notes (Signed)
Pre visit review using our clinic review tool, if applicable. No additional management support is needed unless otherwise documented below in the visit note. 

## 2014-09-08 NOTE — Telephone Encounter (Signed)
Pt.notified

## 2014-09-21 ENCOUNTER — Ambulatory Visit (INDEPENDENT_AMBULATORY_CARE_PROVIDER_SITE_OTHER): Payer: BC Managed Care – PPO | Admitting: Family

## 2014-09-21 ENCOUNTER — Other Ambulatory Visit: Payer: BC Managed Care – PPO

## 2014-09-21 ENCOUNTER — Encounter: Payer: Self-pay | Admitting: Family

## 2014-09-21 VITALS — BP 124/82 | HR 90 | Temp 98.1°F | Resp 18 | Ht 64.0 in | Wt 226.8 lb

## 2014-09-21 DIAGNOSIS — R6882 Decreased libido: Secondary | ICD-10-CM

## 2014-09-21 DIAGNOSIS — Z9189 Other specified personal risk factors, not elsewhere classified: Secondary | ICD-10-CM

## 2014-09-21 DIAGNOSIS — Z0000 Encounter for general adult medical examination without abnormal findings: Secondary | ICD-10-CM

## 2014-09-21 NOTE — Progress Notes (Signed)
Subjective:    Patient ID: Theresa Gibson, female    DOB: [birth year], 58 y.o.   MRN: [id number]  Chief Complaint  Patient presents with  . CPE    No issues that need to be addressed   HPI:  Theresa Gibson is a 58 y.o. female who presents today for an annual wellness visit. Working on [employer] house.   1) Health Maintenance -   Diet - Diet is doing well. Reports improvement unless she goes to her mothers house which she tries to limit. Weight has been stable.   Wt Readings from Last 3 Encounters:  09/21/14 226 lb 12.8 oz (102.876 kg)  09/07/14 225 lb (102.059 kg)  08/17/14 225 lb (102.059 kg)   Exercise -  Exercise 2x per week. Working with a Physiological scientist.   2) Preventative Exams / Immunizations:  Dental -- About year Vision -- 2 years ago - needs to schedule.  Immunizations -- Up to date Colonoscopy -- has not have one.  Mammogram -- Up to date Bone Density -- Has never been tested  3) Decreased libido -  Reports decreased libido and interest. This has been going on for a while. The condition is currently stable and not getting better or worse. Has not tried any treatments at this point.   Allergies  Allergen Reactions  . Ace Inhibitors     REACTION: Dry cough   Current Outpatient Prescriptions on File Prior to Visit  Medication Sig Dispense Refill  . amLODipine (NORVASC) 5 MG tablet Take 1 tablet (5 mg total) by mouth daily.  90 tablet  0  . buPROPion (WELLBUTRIN XL) 150 MG 24 hr tablet Take 1 tablet (150 mg total) by mouth every morning.  30 tablet  0  . cetirizine (ZYRTEC) 10 MG tablet Take 10 mg by mouth daily.      . naproxen sodium (ANAPROX) 220 MG tablet Take 220 mg by mouth 2 (two) times daily as needed (for pain).      . topiramate (TOPAMAX) 50 MG tablet Take 1 tablet (50 mg total) by mouth 2 (two) times daily.  180 tablet  0   No current facility-administered medications on file prior to visit.   Past Medical History  Diagnosis Date  .  Hypertension   . Arthritis   . Depression   . GERD (gastroesophageal reflux disease)   . PONV (postoperative nausea and vomiting)     nausea only  . Meningioma    Family History  Problem Relation Age of Onset  . Hypertension Mother   . Arthritis Mother   . Heart attack Father     In his 2's   History   Social History  . Marital Status: Divorced    Spouse Name: N/A    Number of Children: N/A  . Years of Education: 14   Occupational History  . Nurse    Social History Main Topics  . Smoking status: Current Every Day Smoker -- 1.00 packs/day for 30 years    Types: Cigarettes  . Smokeless tobacco: Never Used  . Alcohol Use: Yes     Comment: occasionall  . Drug Use: No  . Sexual Activity: Yes    Birth Control/ Protection: Surgical   Other Topics Concern  . Not on file   Social History Narrative   Born and raised in [place], [place]. 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 2 children (daughter 72) (daughter 18)   Play with grandchildren   Recently  started exercising with personal trainer   Started eating better    Review of Systems  Constitutional: Denies fever, chills, fatigue, or significant weight gain/loss. HENT:  Head: Denies headache or neck pain  Ears: Denies changes in hearing, ringing in ears, earache, drainage    Nose: Denies discharge, stuffiness, itching, nosebleed, sinus pain  Throat: Denies sore throat, hoarseness, dry mouth, sores, thrush Eyes: Denies loss/changes in vision, pain, redness, blurry/double vision, flashing lights Cardiovascular: Denies chest pain/discomfort, tightness, palpitations, shortness of breath with activity, difficulty lying down, swelling, sudden awakening with shortness of breath Respiratory: Denies shortness of breath, cough, sputum production, wheezing Gastrointestinal: Denies dysphasia, heartburn, change in appetite, nausea, change in bowel habits, rectal bleeding,  diarrhea, yellow skin or eyes Stays  constipated - takes stool softeners on occasion which work Genitourinary: Denies urgency, burning/pain, blood in urine, incontinence, change in urinary strength. Some frequency on occasion - consumes a lot of tea Musculoskeletal: Denies muscle/joint pain, stiffness, back pain, redness or swelling of joints, trauma Skin: Denies rashes, lumps, itching, dryness, color changes, or hair/nail changes Neurological: Denies dizziness, fainting, seizures, weakness, numbness, tingling, tremor Psychiatric - Denies nervousness, stress, or memory loss Depression - currently stable on Wellbutrin  Endocrine: Denies heat or cold intolerance, sweating, frequent urination, excessive thirst, changes in appetite Hematologic: Denies ease of bruising or bleeding    Objective:    BP 124/82  Pulse 90  Temp(Src) 98.1 F (36.7 C) (Oral)  Resp 18  Ht 5\' 4"  (1.626 m)  Wt 226 lb 12.8 oz (102.876 kg)  BMI 38.91 kg/m2  SpO2 99% Nursing note and vital signs reviewed.  Physical Exam  Constitutional: She is oriented to person, place, and time. She appears well-developed and well-nourished. No distress.  HENT:  Head: Normocephalic.  Right Ear: Hearing, tympanic membrane, external ear and ear canal normal.  Left Ear: Hearing, tympanic membrane, external ear and ear canal normal.  Mouth/Throat: Uvula is midline, oropharynx is clear and moist and mucous membranes are normal.  Turbinates mildly swollen.   Eyes: Conjunctivae, EOM and lids are normal. Pupils are equal, round, and reactive to light. Lids are everted and swept, no foreign bodies found.  Neck: Trachea normal and normal range of motion. Neck supple. No JVD present. No mass and no thyromegaly present.  Cardiovascular: Normal rate, regular rhythm and normal heart sounds.   Pulmonary/Chest: Effort normal and breath sounds normal.  Abdominal: Soft. Bowel sounds are normal. She exhibits no distension and no mass. There is no tenderness. There is no rebound and no  guarding.  Musculoskeletal: Normal range of motion.  Neurological: She is alert and oriented to person, place, and time. She has normal reflexes. No cranial nerve deficit. Coordination normal.  Skin: Skin is warm and dry.  Psychiatric: She has a normal mood and affect. Her behavior is normal. Judgment and thought content normal.       Assessment & Plan:

## 2014-09-21 NOTE — Patient Instructions (Signed)
Thank you for choosing [facility].  Summary/Instructions:   Please stop by the lab for your bloodwork - we will be in contact with your regarding the results  We will be in contact regarding scheduling a colonoscopy and a opthalmologist. Please let us know if you don't hear back within a week.   Your physical exam was normal and your doing a great job!  Good luck with purchasing your first house!

## 2014-09-21 NOTE — Progress Notes (Signed)
Pre visit review using our clinic review tool, if applicable. No additional management support is needed unless otherwise documented below in the visit note. 

## 2014-09-21 NOTE — Assessment & Plan Note (Signed)
Reports decreased libido. Will obtain estrogen and progesterone levels to determine hormonal cause.

## 2014-09-21 NOTE — Assessment & Plan Note (Signed)
1) Anticipatory Guidance: Received guidance on importance of brushing/flossing and scheduling dental exams every 6 months; importance of wearing seat belts  2) Risk Factor Reduction:  Continues to smoke - not ready to quit at the moment is is contemplating. BMI is 38.9 - continue to work on diet and exercise plan to help reduce weight.   3) Immunizations / Screenings / Labs:  Labs are within the normal expected ranges. LDL is 120 which is greater than goal of <100. Continue current diet and exercise habits. Referred to GI to schedule a colonoscopy and opthalmology to establish care and receive annual eye exam. Immunizations are currently up to date.

## 2014-09-22 LAB — PROGESTERONE: Progesterone: <0.2 ng/mL

## 2014-09-23 ENCOUNTER — Telehealth: Payer: Self-pay | Admitting: Family

## 2014-09-23 LAB — ESTROGENS, TOTAL: Estrogen: 64 pg/mL

## 2014-09-23 NOTE — Telephone Encounter (Signed)
Please call patient to inform her that her hormone levels are normal. Unfortunately there are no currently approved medications to help with decreased libido, just many potentials. We can refer her to GYN if she interested in potentially pursing hormone therapy, but I am not sure the benefits will outweigh the risks.

## 2014-09-24 NOTE — Telephone Encounter (Signed)
Called pt to let her know her labs were normal. I told her there were no approved meds to help with decreased libido but if she felt like she wanted to go any farther in hormone therapy she can be referred to her GYN. She wishes to do nothing at this time.

## 2014-10-19 ENCOUNTER — Other Ambulatory Visit: Payer: Self-pay | Admitting: Family

## 2014-11-08 ENCOUNTER — Encounter: Payer: Self-pay | Admitting: Internal Medicine

## 2014-11-11 ENCOUNTER — Other Ambulatory Visit: Payer: Self-pay

## 2014-11-11 MED ORDER — BUPROPION HCL ER (XL) 150 MG PO TB24: 150.0000 mg | ORAL_TABLET | Freq: Every morning | ORAL | Status: DC

## 2015-02-21 ENCOUNTER — Telehealth: Payer: Self-pay | Admitting: Family

## 2015-02-21 ENCOUNTER — Ambulatory Visit: Payer: Self-pay | Admitting: Family

## 2015-02-21 NOTE — Telephone Encounter (Signed)
Patient need refill of all her medication and have them faxed to her job [employer]. please advise

## 2015-02-21 NOTE — Telephone Encounter (Signed)
Ok to reschedule if she calls.

## 2015-02-21 NOTE — Telephone Encounter (Signed)
Ok to refill for 1 month of each, but must schedule follow up for additional as I have not seen her in almost 6 months.

## 2015-02-21 NOTE — Telephone Encounter (Signed)
Patient missed my chart visit today.  Please advise.

## 2015-02-21 NOTE — Telephone Encounter (Signed)
Is it ok to refill all medications? Please advise

## 2015-02-22 ENCOUNTER — Other Ambulatory Visit: Payer: Self-pay

## 2015-02-22 MED ORDER — TOPIRAMATE 50 MG PO TABS: 50.0000 mg | ORAL_TABLET | Freq: Two times a day (BID) | ORAL | Status: DC

## 2015-02-22 MED ORDER — AMLODIPINE BESYLATE 5 MG PO TABS: 5.0000 mg | ORAL_TABLET | Freq: Every day | ORAL | Status: DC

## 2015-02-22 MED ORDER — BUPROPION HCL ER (XL) 150 MG PO TB24: 150.0000 mg | ORAL_TABLET | Freq: Every morning | ORAL | Status: DC

## 2015-02-22 NOTE — Telephone Encounter (Signed)
Pt aware that she needs a visit for next refill

## 2015-02-22 NOTE — Telephone Encounter (Signed)
noted 

## 2015-03-08 ENCOUNTER — Encounter: Payer: Self-pay | Admitting: Internal Medicine

## 2015-04-14 ENCOUNTER — Telehealth: Payer: Self-pay | Admitting: Family

## 2015-04-14 MED ORDER — BUPROPION HCL ER (XL) 150 MG PO TB24: 150.0000 mg | ORAL_TABLET | Freq: Every morning | ORAL | Status: DC

## 2015-04-14 MED ORDER — TOPIRAMATE 50 MG PO TABS: 50.0000 mg | ORAL_TABLET | Freq: Two times a day (BID) | ORAL | Status: DC

## 2015-04-14 NOTE — Telephone Encounter (Signed)
Pt called in said that she has an appt on the 19th but wantst to know if [name] can call in 10 pills of her Wellbutrin and her Topamax just to make it to appt?     [facility] on [address] [phone]

## 2015-04-14 NOTE — Telephone Encounter (Signed)
Medication refilled

## 2015-04-15 NOTE — Telephone Encounter (Signed)
Pt aware.

## 2015-04-21 ENCOUNTER — Encounter: Payer: Self-pay | Admitting: Family

## 2015-04-21 ENCOUNTER — Ambulatory Visit (INDEPENDENT_AMBULATORY_CARE_PROVIDER_SITE_OTHER): Payer: BLUE CROSS/BLUE SHIELD | Admitting: Family

## 2015-04-21 VITALS — BP 120/78 | HR 71 | Temp 98.0°F | Resp 18 | Ht 64.0 in | Wt 226.4 lb

## 2015-04-21 DIAGNOSIS — R519 Headache, unspecified: Secondary | ICD-10-CM | POA: Insufficient documentation

## 2015-04-21 DIAGNOSIS — R51 Headache: Secondary | ICD-10-CM

## 2015-04-21 DIAGNOSIS — F32A Depression, unspecified: Secondary | ICD-10-CM

## 2015-04-21 DIAGNOSIS — M170 Bilateral primary osteoarthritis of knee: Secondary | ICD-10-CM | POA: Diagnosis not present

## 2015-04-21 DIAGNOSIS — E669 Obesity, unspecified: Secondary | ICD-10-CM | POA: Diagnosis not present

## 2015-04-21 DIAGNOSIS — F329 Major depressive disorder, single episode, unspecified: Secondary | ICD-10-CM

## 2015-04-21 DIAGNOSIS — M1990 Unspecified osteoarthritis, unspecified site: Secondary | ICD-10-CM | POA: Insufficient documentation

## 2015-04-21 MED ORDER — BUPROPION HCL ER (XL) 300 MG PO TB24: 300.0000 mg | ORAL_TABLET | Freq: Every morning | ORAL | Status: DC

## 2015-04-21 MED ORDER — TOPIRAMATE 50 MG PO TABS: 50.0000 mg | ORAL_TABLET | Freq: Two times a day (BID) | ORAL | Status: DC

## 2015-04-21 MED ORDER — NAPROXEN SODIUM 220 MG PO TABS: 220.0000 mg | ORAL_TABLET | Freq: Two times a day (BID) | ORAL | Status: DC | PRN

## 2015-04-21 NOTE — Patient Instructions (Addendum)
  Summary/Instructions:  Please check your diet using the phone application MyFitnessPal.   We have increased her dose of Wellbutrin to 300 mg daily.  Thank you for choosing [facility].  Your prescription(s) have been submitted to your pharmacy or been printed and provided for you. Please take as directed and contact our office if you believe you are having problem(s) with the medication(s) or have any questions.

## 2015-04-21 NOTE — Assessment & Plan Note (Signed)
Discussed importance of behavioral and lifestyle changes for effective weight loss as well as risks and benefits of medication assisted weight loss. Emphasized importance of nutrient density and continued physical activity. Continue attempts at weight loss without medication at this time using diet tracking software and exercise. Follow-up in one month to reevaluate.

## 2015-04-21 NOTE — Assessment & Plan Note (Signed)
Refill naproxen per patient request.

## 2015-04-21 NOTE — Progress Notes (Signed)
Pre visit review using our clinic review tool, if applicable. No additional management support is needed unless otherwise documented below in the visit note. 

## 2015-04-21 NOTE — Progress Notes (Signed)
   Subjective:    Patient ID: Theresa Gibson, female    DOB: [birth year], 59 y.o.   MRN: [id number]  Chief Complaint  Patient presents with  . Medication Refill    needs topamax, wellbutrin, and naproxen refilled, needs rxs printed to mail out herself needs 90 days supply, also wants to see if she can get put on phentermine    HPI:  Theresa Gibson is a 59 y.o. female with a PMH of hypertension, GERD, hyperlipidemia, headaches and depression who presents today for a follow up office visit.  1) Depression - Reports that her depression has been labile and there are a couple of occasions where she has taken 2 pills. Notes  family changes that her children have come back to live with her and reports that she still has some depression. Denies any side effects of the medication. Denies any suicidal ideations.   2) Obesity - Indicates that she has recently cut back in several areas of her diet including sodas and sugar. She has been doing water aerobics and exercising periodically.    Wt Readings from Last 3 Encounters:  04/21/15 226 lb 6.4 oz (102.694 kg)  09/21/14 226 lb 12.8 oz (102.876 kg)  09/07/14 225 lb (102.059 kg)    3) Headaches - Currently maintained and well controlled with Topamax. Takes her medication as prescribed and denies any adverse side effects.   Allergies  Allergen Reactions  . Ace Inhibitors     REACTION: Dry cough    Outpatient Prescriptions Prior to Visit  Medication Sig Dispense Refill  . cetirizine (ZYRTEC) 10 MG tablet Take 10 mg by mouth daily.    Marland Kitchen amLODipine (NORVASC) 5 MG tablet Take 1 tablet (5 mg total) by mouth daily. 30 tablet 0  . buPROPion (WELLBUTRIN XL) 150 MG 24 hr tablet Take 1 tablet (150 mg total) by mouth every morning. 10 tablet 0  . naproxen sodium (ANAPROX) 220 MG tablet Take 220 mg by mouth 2 (two) times daily as needed (for pain).    . topiramate (TOPAMAX) 50 MG tablet Take 1 tablet (50 mg total) by mouth 2 (two) times daily. 60 tablet 0     No facility-administered medications prior to visit.     Past Medical History  Diagnosis Date  . Hypertension   . Arthritis   . Depression   . GERD (gastroesophageal reflux disease)   . PONV (postoperative nausea and vomiting)     nausea only  . Meningioma     Review of Systems  Neurological: Negative for headaches.  Psychiatric/Behavioral: Positive for dysphoric mood. Negative for suicidal ideas.      Objective:    BP 120/78 mmHg  Pulse 71  Temp(Src) 98 F (36.7 C) (Oral)  Resp 18  Ht 5\' 4"  (1.626 m)  Wt 226 lb 6.4 oz (102.694 kg)  BMI 38.84 kg/m2  SpO2 98% Nursing note and vital signs reviewed.  Physical Exam  Constitutional: She is oriented to person, place, and time. She appears well-developed and well-nourished. No distress.  Cardiovascular: Normal rate, regular rhythm, normal heart sounds and intact distal pulses.   Pulmonary/Chest: Effort normal and breath sounds normal.  Neurological: She is alert and oriented to person, place, and time.  Skin: Skin is warm and dry.  Psychiatric: She has a normal mood and affect. Her behavior is normal. Judgment and thought content normal.       Assessment & Plan:

## 2015-04-21 NOTE — Assessment & Plan Note (Addendum)
Headaches remain well controlled with Topamax. Denies any adverse effects of medication. Continue current dosage of Topamax.

## 2015-04-21 NOTE — Assessment & Plan Note (Signed)
Depression appears to be labile with current dosage of Wellbutrin. Increase Wellbutrin to 300 mg daily. Patient denies suicidal ideations. Follow-up in 1 month to determine effectiveness.

## 2015-04-25 ENCOUNTER — Encounter: Payer: Self-pay | Admitting: Internal Medicine

## 2015-05-10 ENCOUNTER — Encounter: Payer: Self-pay | Admitting: Internal Medicine

## 2015-06-21 ENCOUNTER — Ambulatory Visit (AMBULATORY_SURGERY_CENTER): Payer: Self-pay | Admitting: *Deleted

## 2015-06-21 VITALS — Ht 64.0 in | Wt 226.4 lb

## 2015-06-21 DIAGNOSIS — Z1211 Encounter for screening for malignant neoplasm of colon: Secondary | ICD-10-CM

## 2015-06-21 MED ORDER — NA SULFATE-K SULFATE-MG SULF 17.5-3.13-1.6 GM/177ML PO SOLN: 1.0000 | Freq: Once | ORAL | Status: DC

## 2015-06-21 NOTE — Progress Notes (Signed)
No egg or soy allergy Pt has had post op nausea/vomiting.  She states she has received reglan prior to sedation She states she has issues  passing her stool but she doesn't have hard stools  No diet pills No home 02 use emmi video declined

## 2015-06-27 ENCOUNTER — Encounter: Payer: Self-pay | Admitting: Internal Medicine

## 2015-07-26 ENCOUNTER — Encounter: Payer: Self-pay | Admitting: Family

## 2015-07-26 ENCOUNTER — Other Ambulatory Visit: Payer: Self-pay

## 2015-07-26 ENCOUNTER — Ambulatory Visit (INDEPENDENT_AMBULATORY_CARE_PROVIDER_SITE_OTHER): Payer: BLUE CROSS/BLUE SHIELD | Admitting: Family

## 2015-07-26 ENCOUNTER — Telehealth: Payer: Self-pay | Admitting: Family

## 2015-07-26 VITALS — BP 124/84 | HR 70 | Temp 98.2°F | Resp 18 | Ht 64.0 in | Wt 228.0 lb

## 2015-07-26 DIAGNOSIS — F32A Depression, unspecified: Secondary | ICD-10-CM

## 2015-07-26 DIAGNOSIS — M25519 Pain in unspecified shoulder: Secondary | ICD-10-CM | POA: Insufficient documentation

## 2015-07-26 DIAGNOSIS — M25512 Pain in left shoulder: Secondary | ICD-10-CM | POA: Diagnosis not present

## 2015-07-26 DIAGNOSIS — F329 Major depressive disorder, single episode, unspecified: Secondary | ICD-10-CM | POA: Diagnosis not present

## 2015-07-26 DIAGNOSIS — E669 Obesity, unspecified: Secondary | ICD-10-CM

## 2015-07-26 MED ORDER — VENLAFAXINE HCL ER 37.5 MG PO CP24: 37.5000 mg | ORAL_CAPSULE | Freq: Every day | ORAL | Status: DC

## 2015-07-26 MED ORDER — BUPROPION HCL ER (XL) 300 MG PO TB24: 300.0000 mg | ORAL_TABLET | Freq: Every morning | ORAL | Status: DC

## 2015-07-26 MED ORDER — TOPIRAMATE 50 MG PO TABS: 50.0000 mg | ORAL_TABLET | Freq: Two times a day (BID) | ORAL | Status: DC

## 2015-07-26 MED ORDER — PHENTERMINE HCL 15 MG PO CAPS: 15.0000 mg | ORAL_CAPSULE | ORAL | Status: DC

## 2015-07-26 NOTE — Assessment & Plan Note (Signed)
Symptoms of obesity with minimal lifestyle changes. Discussed importance of eating a nutrient dense diet and decreasing saturated fat increasing physical activity and her daily lifestyle. Start low-dose phentermine. Follow up in 1 month to determine effectiveness and weight loss.

## 2015-07-26 NOTE — Progress Notes (Signed)
Subjective:    Patient ID: Theresa Gibson, female    DOB: 1956-08-06, 60 y.o.   MRN: [id number]  Chief Complaint  Patient presents with  . Follow-up    x1 month, left arm has been hurting and her weight is going up    HPI:  Theresa Gibson is a 59 y.o. female with a PMH of palpitations, osteoarthritis, obesity, hyperlipidemia, GERD, headaches, essential hypertension, depression, and cavernous hemangioma who presents today for a follow-up office visit.  1.) Depression -  Previously maintained on wellbutrin and has self-discontinued the medication about 1 month ago. Indicates the reason she discontinued taking the medication was because she did not feel like it was working. Denies adverse side effects from coming off the medication. Has previously tried Celexa. Feels like she needs something to help take the edge off.   2.) Arm pain - Associated symptom of pain located throughout her left arm has been going on for about a month. Pain is described sharp with a severity described only as excruciating. Modifying treatments attempted include ibuoprofen and a wrist brace. Notes that the pain started in her thumb and has progressively worsen. Denies neck pain or stiffness. Notes the pain stops in her left shoulder.   3.) Obesity - experienced this associated symptom of weight gain over the past several months. Indicates she has tried physical activity and decreasing her diet with no success. There is some skepticism regarding this. She would like to try phentermine.  Allergies  Allergen Reactions  . Ace Inhibitors     REACTION: Dry cough-lisinopril     Current Outpatient Prescriptions on File Prior to Visit  Medication Sig Dispense Refill  . cetirizine (ZYRTEC) 10 MG tablet Take 10 mg by mouth daily.    . Na Sulfate-K Sulfate-Mg Sulf (SUPREP BOWEL PREP) SOLN Take 1 kit by mouth once. suprep as directed. No substitutions 354 mL 0   No current facility-administered medications on file prior  to visit.    Past Medical History  Diagnosis Date  . Hypertension   . Arthritis   . Depression   . GERD (gastroesophageal reflux disease)   . PONV (postoperative nausea and vomiting)     nausea only  . Meningioma   . Allergy       Review of Systems  Constitutional: Positive for chills. Negative for fever.  Musculoskeletal:       Positive for shoulder pain.  Neurological: Negative for weakness and numbness.  Psychiatric/Behavioral: Positive for dysphoric mood. Negative for suicidal ideas. The patient is nervous/anxious.       Objective:    BP 124/84 mmHg  Pulse 70  Temp(Src) 98.2 F (36.8 C) (Oral)  Resp 18  Ht '5\' 4"'  (1.626 m)  Wt 228 lb (103.42 kg)  BMI 39.12 kg/m2  SpO2 98% Nursing note and vital signs reviewed.  Physical Exam  Constitutional: She is oriented to person, place, and time. She appears well-developed and well-nourished. No distress.  Cardiovascular: Normal rate, regular rhythm, normal heart sounds and intact distal pulses.   Pulmonary/Chest: Effort normal and breath sounds normal.  Musculoskeletal:  Left shoulder - no obvious deformity, discoloration, or edema noted. Palpable tenderness over her supraspinatus tendon and subacromial space. Range of motion is full in active and passive range of motion with some discomfort noted above 90 of flexion and abduction. Positive Neer's impingement. Positive Michel Bickers. Distal pulses are intact and appropriate. Distal sensation is intact and appropriate.  Neurological: She is alert and oriented to  person, place, and time.  Skin: Skin is warm and dry.  Psychiatric: She has a normal mood and affect. Her behavior is normal. Judgment and thought content normal.       Assessment & Plan:   Problem List Items Addressed This Visit      Other   Obesity    Symptoms of obesity with minimal lifestyle changes. Discussed importance of eating a nutrient dense diet and decreasing saturated fat increasing physical  activity and her daily lifestyle. Start low-dose phentermine. Follow up in 1 month to determine effectiveness and weight loss.      Relevant Medications   phentermine 15 MG capsule   Depression    Self discontinued taking Wellbutrin approximately one month ago with no side effects. Does have mild symptoms of depression and anxiety currently. Would like something to help improve her symptoms slightly. Start Effexor. Patient informed medication list for at least 4-6 weeks to show potential effects. Follow-up in one month to determine effectiveness.      Relevant Medications   venlafaxine XR (EFFEXOR-XR) 37.5 MG 24 hr capsule   Pain in joint, shoulder region - Primary    Shoulder pain consistent with bursitis. Treat conservatively at this time with home exercise therapy, ice, stretching, and over-the-counter medications as needed for symptom relief and supportive care. Follow-up in 3 weeks to determine effectiveness. Possible referral to sports medicine for cortisone injection.

## 2015-07-26 NOTE — Assessment & Plan Note (Signed)
Self discontinued taking Wellbutrin approximately one month ago with no side effects. Does have mild symptoms of depression and anxiety currently. Would like something to help improve her symptoms slightly. Start Effexor. Patient informed medication list for at least 4-6 weeks to show potential effects. Follow-up in one month to determine effectiveness.

## 2015-07-26 NOTE — Patient Instructions (Addendum)
Thank you for choosing [facility].  Summary/Instructions:  Your prescription(s) have been submitted to your pharmacy or been printed and provided for you. Please take as directed and contact our office if you believe you are having problem(s) with the medication(s) or have any questions.  If your symptoms worsen or fail to improve, please contact our office for further instruction, or in case of emergency go directly to the emergency room at the closest medical facility.    Phentermine tablets or capsules What is this medicine? PHENTERMINE (FEN ter meen) decreases your appetite. It is used with a reduced calorie diet and exercise to help you lose weight. This medicine may be used for other purposes; ask your health care provider or pharmacist if you have questions. COMMON BRAND NAME(S): Adipex-P, Atti-Plex P, Atti-Plex P Spansule, Fastin, Pro-Fast, Tara-8 What should I tell my health care provider before I take this medicine? They need to know if you have any of these conditions: -agitation -glaucoma -heart disease -high blood pressure -history of substance abuse -lung disease called Primary Pulmonary Hypertension (PPH) -taken an MAOI like Carbex, Eldepryl, Marplan, Nardil, or Parnate in last 14 days -thyroid disease -an unusual or allergic reaction to phentermine, other medicines, foods, dyes, or preservatives -pregnant or trying to get pregnant -breast-feeding How should I use this medicine? Take this medicine by mouth with a glass of water. Follow the directions on the prescription label. This medicine is usually taken 30 minutes before or 1 to 2 hours after breakfast. Avoid taking this medicine in the evening. It may interfere with sleep. Take your doses at regular intervals. Do not take your medicine more often than directed. Talk to your pediatrician regarding the use of this medicine in children. Special care may be needed. Overdosage: If you think you have taken too much  of this medicine contact a poison control center or emergency room at once. NOTE: This medicine is only for you. Do not share this medicine with others. What if I miss a dose? If you miss a dose, take it as soon as you can. If it is almost time for your next dose, take only that dose. Do not take double or extra doses. What may interact with this medicine? Do not take this medicine with any of the following medications: -duloxetine -MAOIs like Carbex, Eldepryl, Marplan, Nardil, and Parnate -medicines for colds or breathing difficulties like pseudoephedrine or phenylephrine -procarbazine -sibutramine -SSRIs like citalopram, escitalopram, fluoxetine, fluvoxamine, paroxetine, and sertraline -stimulants like dexmethylphenidate, methylphenidate or modafinil -venlafaxine This medicine may also interact with the following medications: -medicines for diabetes This list may not describe all possible interactions. Give your health care provider a list of all the medicines, herbs, non-prescription drugs, or dietary supplements you use. Also tell them if you smoke, drink alcohol, or use illegal drugs. Some items may interact with your medicine. What should I watch for while using this medicine? Notify your physician immediately if you become short of breath while doing your normal activities. Do not take this medicine within 6 hours of bedtime. It can keep you from getting to sleep. Avoid drinks that contain caffeine and try to stick to a regular bedtime every night. This medicine was intended to be used in addition to a healthy diet and exercise. The best results are achieved this way. This medicine is only indicated for short-term use. Eventually your weight loss may level out. At that point, the drug will only help you maintain your new weight. Do not increase or  in any way change your dose without consulting your doctor. You may get drowsy or dizzy. Do not drive, use machinery, or do anything that needs  mental alertness until you know how this medicine affects you. Do not stand or sit up quickly, especially if you are an older patient. This reduces the risk of dizzy or fainting spells. Alcohol may increase dizziness and drowsiness. Avoid alcoholic drinks. What side effects may I notice from receiving this medicine? Side effects that you should report to your doctor or health care professional as soon as possible: -chest pain, palpitations -depression or severe changes in mood -increased blood pressure -irritability -nervousness or restlessness -severe dizziness -shortness of breath -problems urinating -unusual swelling of the legs -vomiting Side effects that usually do not require medical attention (report to your doctor or health care professional if they continue or are bothersome): -blurred vision or other eye problems -changes in sexual ability or desire -constipation or diarrhea -difficulty sleeping -dry mouth or unpleasant taste -headache -nausea This list may not describe all possible side effects. Call your doctor for medical advice about side effects. You may report side effects to FDA at 1-800-FDA-1088. Where should I keep my medicine? Keep out of the reach of children. This medicine can be abused. Keep your medicine in a safe place to protect it from theft. Do not share this medicine with anyone. Selling or giving away this medicine is dangerous and against the law. Store at room temperature between 20 and 25 degrees C (68 and 77 degrees F). Keep container tightly closed. Throw away any unused medicine after the expiration date. NOTE: This sheet is a summary. It may not cover all possible information. If you have questions about this medicine, talk to your doctor, pharmacist, or health care provider.  2015, Elsevier/Gold Standard. (2011-01-03 11:02:44)  Venlafaxine extended-release capsules What is this medicine? VENLAFAXINE(VEN la fax een) is used to treat depression,  anxiety and panic disorder. This medicine may be used for other purposes; ask your health care provider or pharmacist if you have questions. COMMON BRAND NAME(S): Effexor XR What should I tell my health care provider before I take this medicine? They need to know if you have any of these conditions: -bleeding disorders -glaucoma -heart disease -high blood pressure -high cholesterol -kidney disease -liver disease -low levels of sodium in the blood -mania or bipolar disorder -seizures -suicidal thoughts, plans, or attempt; a previous suicide attempt by you or a family -take medicines that treat or prevent blood clots -thyroid disease -an unusual or allergic reaction to venlafaxine, desvenlafaxine, other medicines, foods, dyes, or preservatives -pregnant or trying to get pregnant -breast-feeding How should I use this medicine? Take this medicine by mouth with a full glass of water. Follow the directions on the prescription label. Do not cut, crush, or chew this medicine. Take it with food. If needed, the capsule may be carefully opened and the entire contents sprinkled on a spoonful of cool applesauce. Swallow the applesauce/pellet mixture right away without chewing and follow with a glass of water to ensure complete swallowing of the pellets. Try to take your medicine at about the same time each day. Do not take your medicine more often than directed. Do not stop taking this medicine suddenly except upon the advice of your doctor. Stopping this medicine too quickly may cause serious side effects or your condition may worsen. A special MedGuide will be given to you by the pharmacist with each prescription and refill. Be sure to read this  information carefully each time. Talk to your pediatrician regarding the use of this medicine in children. Special care may be needed. Overdosage: If you think you have taken too much of this medicine contact a poison control center or emergency room at  once. NOTE: This medicine is only for you. Do not share this medicine with others. What if I miss a dose? If you miss a dose, take it as soon as you can. If it is almost time for your next dose, take only that dose. Do not take double or extra doses. What may interact with this medicine? Do not take this medicine with any of the following medications: -certain medicines for fungal infections like fluconazole, itraconazole, ketoconazole, posaconazole, voriconazole -cisapride -desvenlafaxine -dofetilide -dronedarone -duloxetine -levomilnacipran -linezolid -MAOIs like Carbex, Eldepryl, Marplan, Nardil, and Parnate -methylene blue (injected into a vein) -milnacipran -pimozide -thioridazine -ziprasidone This medicine may also interact with the following medications: -aspirin and aspirin-like medicines -certain medicines for depression, anxiety, or psychotic disturbances -certain medicines for migraine headaches like almotriptan, eletriptan, frovatriptan, naratriptan, rizatriptan, sumatriptan, zolmitriptan -certain medicines for sleep -certain medicines that treat or prevent blood clots like dalteparin, enoxaparin, warfarin -cimetidine -clozapine -diuretics -fentanyl -furazolidone -indinavir -isoniazid -lithium -metoprolol -NSAIDS, medicines for pain and inflammation, like ibuprofen or naproxen -other medicines that prolong the QT interval (cause an abnormal heart rhythm) -procarbazine -rasagiline -supplements like St. John's wort, kava kava, valerian -tramadol -tryptophan This list may not describe all possible interactions. Give your health care provider a list of all the medicines, herbs, non-prescription drugs, or dietary supplements you use. Also tell them if you smoke, drink alcohol, or use illegal drugs. Some items may interact with your medicine. What should I watch for while using this medicine? Tell your doctor if your symptoms do not get better or if they get worse.  Visit your doctor or health care professional for regular checks on your progress. Because it may take several weeks to see the full effects of this medicine, it is important to continue your treatment as prescribed by your doctor. Patients and their families should watch out for new or worsening thoughts of suicide or depression. Also watch out for sudden changes in feelings such as feeling anxious, agitated, panicky, irritable, hostile, aggressive, impulsive, severely restless, overly excited and hyperactive, or not being able to sleep. If this happens, especially at the beginning of treatment or after a change in dose, call your health care professional. This medicine can cause an increase in blood pressure. Check with your doctor for instructions on monitoring your blood pressure while taking this medicine. You may get drowsy or dizzy. Do not drive, use machinery, or do anything that needs mental alertness until you know how this medicine affects you. Do not stand or sit up quickly, especially if you are an older patient. This reduces the risk of dizzy or fainting spells. Alcohol may interfere with the effect of this medicine. Avoid alcoholic drinks. Your mouth may get dry. Chewing sugarless gum, sucking hard candy and drinking plenty of water will help. Contact your doctor if the problem does not go away or is severe. What side effects may I notice from receiving this medicine? Side effects that you should report to your doctor or health care professional as soon as possible: -allergic reactions like skin rash, itching or hives, swelling of the face, lips, or tongue -breathing problems -changes in vision -hallucination, loss of contact with reality -seizures -suicidal thoughts or other mood changes -trouble passing urine or change in  the amount of urine -unusual bleeding or bruising Side effects that usually do not require medical attention (report to your doctor or health care professional if  they continue or are bothersome): -change in sex drive or performance -constipation -increased sweating -loss of appetite -nausea -tremors -weight loss This list may not describe all possible side effects. Call your doctor for medical advice about side effects. You may report side effects to FDA at 1-800-FDA-1088. Where should I keep my medicine? Keep out of the reach of children. Store at a controlled temperature between 20 and 25 degrees C (68 degrees and 77 degrees F), in a dry place. Throw away any unused medicine after the expiration date. NOTE: This sheet is a summary. It may not cover all possible information. If you have questions about this medicine, talk to your doctor, pharmacist, or health care provider.  2015, [employer]vier/Gold Standard. (2013-06-16 12:46:03)   Shoulder Exercises EXERCISES  RANGE OF MOTION (ROM) AND STRETCHING EXERCISES These exercises may help you when beginning to rehabilitate your injury. Your symptoms may resolve with or without further involvement from your physician, physical therapist or athletic trainer. While completing these exercises, remember:   Restoring tissue flexibility helps normal motion to return to the joints. This allows healthier, less painful movement and activity.  An effective stretch should be held for at least 30 seconds.  A stretch should never be painful. You should only feel a gentle lengthening or release in the stretched tissue. ROM - Pendulum  Bend at the waist so that your right / left arm falls away from your body. Support yourself with your opposite hand on a solid surface, such as a table or a countertop.  Your right / left arm should be perpendicular to the ground. If it is not perpendicular, you need to lean over farther. Relax the muscles in your right / left arm and shoulder as much as possible.  Gently sway your hips and trunk so they move your right / left arm without any use of your right / left shoulder  muscles.  Progress your movements so that your right / left arm moves side to side, then forward and backward, and finally, both clockwise and counterclockwise.  Complete __________ repetitions in each direction. Many people use this exercise to relieve discomfort in their shoulder as well as to gain range of motion. Repeat __________ times. Complete this exercise __________ times per day. STRETCH - Flexion, Standing  Stand with good posture. With an underhand grip on your right / left hand and an overhand grip on the opposite hand, grasp a broomstick or cane so that your hands are a little more than shoulder-width apart.  Keeping your right / left elbow straight and shoulder muscles relaxed, push the stick with your opposite hand to raise your right / left arm in front of your body and then overhead. Raise your arm until you feel a stretch in your right / left shoulder, but before you have increased shoulder pain.  Try to avoid shrugging your right / left shoulder as your arm rises by keeping your shoulder blade tucked down and toward your mid-back spine. Hold __________ seconds.  Slowly return to the starting position. Repeat __________ times. Complete this exercise __________ times per day. STRETCH - Internal Rotation  Place your right / left hand behind your back, palm-up.  Throw a towel or belt over your opposite shoulder. Grasp the towel/belt with your right / left hand.  While keeping an upright posture, gently pull  up on the towel/belt until you feel a stretch in the front of your right / left shoulder.  Avoid shrugging your right / left shoulder as your arm rises by keeping your shoulder blade tucked down and toward your mid-back spine.  Hold __________. Release the stretch by lowering your opposite hand. Repeat __________ times. Complete this exercise __________ times per day. STRETCH - External Rotation and Abduction  Stagger your stance through a doorframe. It does not  matter which foot is forward.  As instructed by your physician, physical therapist or athletic trainer, place your hands:  And forearms above your head and on the door frame.  And forearms at head-height and on the door frame.  At elbow-height and on the door frame.  Keeping your head and chest upright and your stomach muscles tight to prevent over-extending your low-back, slowly shift your weight onto your front foot until you feel a stretch across your chest and/or in the front of your shoulders.  Hold __________ seconds. Shift your weight to your back foot to release the stretch. Repeat __________ times. Complete this stretch __________ times per day.  STRENGTHENING EXERCISES  These exercises may help you when beginning to rehabilitate your injury. They may resolve your symptoms with or without further involvement from your physician, physical therapist or athletic trainer. While completing these exercises, remember:   Muscles can gain both the endurance and the strength needed for everyday activities through controlled exercises.  Complete these exercises as instructed by your physician, physical therapist or athletic trainer. Progress the resistance and repetitions only as guided.  You may experience muscle soreness or fatigue, but the pain or discomfort you are trying to eliminate should never worsen during these exercises. If this pain does worsen, stop and make certain you are following the directions exactly. If the pain is still present after adjustments, discontinue the exercise until you can discuss the trouble with your clinician.  If advised by your physician, during your recovery, avoid activity or exercises which involve actions that place your right / left hand or elbow above your head or behind your back or head. These positions stress the tissues which are trying to heal. STRENGTH - Scapular Depression and Adduction  With good posture, sit on a firm chair. Supported your  arms in front of you with pillows, arm rests or a table top. Have your elbows in line with the sides of your body.  Gently draw your shoulder blades down and toward your mid-back spine. Gradually increase the tension without tensing the muscles along the top of your shoulders and the back of your neck.  Hold for __________ seconds. Slowly release the tension and relax your muscles completely before completing the next repetition.  After you have practiced this exercise, remove the arm support and complete it in standing as well as sitting. Repeat __________ times. Complete this exercise __________ times per day.  STRENGTH - External Rotators  Secure a rubber exercise band/tubing to a fixed object so that it is at the same height as your right / left elbow when you are standing or sitting on a firm surface.  Stand or sit so that the secured exercise band/tubing is at your side that is not injured.  Bend your elbow 90 degrees. Place a folded towel or small pillow under your right / left arm so that your elbow is a few inches away from your side.  Keeping the tension on the exercise band/tubing, pull it away from your body, as  if pivoting on your elbow. Be sure to keep your body steady so that the movement is only coming from your shoulder rotating.  Hold __________ seconds. Release the tension in a controlled manner as you return to the starting position. Repeat __________ times. Complete this exercise __________ times per day.  STRENGTH - Supraspinatus  Stand or sit with good posture. Grasp a __________ weight or an exercise band/tubing so that your hand is "thumbs-up," like when you shake hands.  Slowly lift your right / left hand from your thigh into the air, traveling about 30 degrees from straight out at your side. Lift your hand to shoulder height or as far as you can without increasing any shoulder pain. Initially, many people do not lift their hands above shoulder height.  Avoid  shrugging your right / left shoulder as your arm rises by keeping your shoulder blade tucked down and toward your mid-back spine.  Hold for __________ seconds. Control the descent of your hand as you slowly return to your starting position. Repeat __________ times. Complete this exercise __________ times per day.  STRENGTH - Shoulder Extensors  Secure a rubber exercise band/tubing so that it is at the height of your shoulders when you are either standing or sitting on a firm arm-less chair.  With a thumbs-up grip, grasp an end of the band/tubing in each hand. Straighten your elbows and lift your hands straight in front of you at shoulder height. Step back away from the secured end of band/tubing until it becomes tense.  Squeezing your shoulder blades together, pull your hands down to the sides of your thighs. Do not allow your hands to go behind you.  Hold for __________ seconds. Slowly ease the tension on the band/tubing as you reverse the directions and return to the starting position. Repeat __________ times. Complete this exercise __________ times per day.  STRENGTH - Scapular Retractors  Secure a rubber exercise band/tubing so that it is at the height of your shoulders when you are either standing or sitting on a firm arm-less chair.  With a palm-down grip, grasp an end of the band/tubing in each hand. Straighten your elbows and lift your hands straight in front of you at shoulder height. Step back away from the secured end of band/tubing until it becomes tense.  Squeezing your shoulder blades together, draw your elbows back as you bend them. Keep your upper arm lifted away from your body throughout the exercise.  Hold __________ seconds. Slowly ease the tension on the band/tubing as you reverse the directions and return to the starting position. Repeat __________ times. Complete this exercise __________ times per day. STRENGTH - Scapular Depressors  Find a sturdy chair without wheels,  such as a from a dining room table.  Keeping your feet on the floor, lift your bottom from the seat and lock your elbows.  Keeping your elbows straight, allow gravity to pull your body weight down. Your shoulders will rise toward your ears.  Raise your body against gravity by drawing your shoulder blades down your back, shortening the distance between your shoulders and ears. Although your feet should always maintain contact with the floor, your feet should progressively support less body weight as you get stronger.  Hold __________ seconds. In a controlled and slow manner, lower your body weight to begin the next repetition. Repeat __________ times. Complete this exercise __________ times per day.  Document Released: 10/03/2005 Document Revised: 02/11/2012 Document Reviewed: 03/03/2009 [facility] Patient Information 2015 [place], [place]. This  information is not intended to replace advice given to you by your health care provider. Make sure you discuss any questions you have with your health care provider.  

## 2015-07-26 NOTE — Assessment & Plan Note (Signed)
Shoulder pain consistent with bursitis. Treat conservatively at this time with home exercise therapy, ice, stretching, and over-the-counter medications as needed for symptom relief and supportive care. Follow-up in 3 weeks to determine effectiveness. Possible referral to sports medicine for cortisone injection.

## 2015-07-26 NOTE — Progress Notes (Signed)
Pre visit review using our clinic review tool, if applicable. No additional management support is needed unless otherwise documented below in the visit note. 

## 2015-07-26 NOTE — Telephone Encounter (Signed)
[name] is requesting a 90 days supply for venlafaxine XR (EFFEXOR-XR) 37.5 MG 24 hr capsule [[id number]]. Please give them a call to advise approved or denied

## 2015-07-27 ENCOUNTER — Encounter: Payer: Self-pay | Admitting: Internal Medicine

## 2015-07-27 NOTE — Telephone Encounter (Signed)
Order changed.

## 2015-08-02 ENCOUNTER — Telehealth: Payer: Self-pay | Admitting: Family

## 2015-08-02 NOTE — Telephone Encounter (Signed)
Patient has been taking her venlafaxine XR (EFFEXOR-XR) 37.5 MG 24 hr capsule [[id number]] and she has been nauseous and light headedness.  She is wondering if this is a normal side effect and should she keep taking it

## 2015-08-03 NOTE — Telephone Encounter (Signed)
Yes, please continue to take it and the symptoms should go away. If they do not please let us know.

## 2015-08-03 NOTE — Telephone Encounter (Signed)
Pt informed

## 2015-08-10 ENCOUNTER — Ambulatory Visit (AMBULATORY_SURGERY_CENTER): Payer: BLUE CROSS/BLUE SHIELD | Admitting: Internal Medicine

## 2015-08-10 ENCOUNTER — Encounter: Payer: Self-pay | Admitting: Internal Medicine

## 2015-08-10 VITALS — BP 133/70 | HR 52 | Resp 16 | Ht 64.0 in | Wt 226.0 lb

## 2015-08-10 DIAGNOSIS — Z1211 Encounter for screening for malignant neoplasm of colon: Secondary | ICD-10-CM

## 2015-08-10 DIAGNOSIS — K529 Noninfective gastroenteritis and colitis, unspecified: Secondary | ICD-10-CM | POA: Diagnosis not present

## 2015-08-10 DIAGNOSIS — D122 Benign neoplasm of ascending colon: Secondary | ICD-10-CM | POA: Diagnosis not present

## 2015-08-10 DIAGNOSIS — K639 Disease of intestine, unspecified: Secondary | ICD-10-CM | POA: Diagnosis not present

## 2015-08-10 MED ORDER — SODIUM CHLORIDE 0.9 % IV SOLN: 500.0000 mL | INTRAVENOUS | Status: DC

## 2015-08-10 NOTE — Op Note (Signed)
[facility]  [employer] [place], [address]   COLONOSCOPY PROCEDURE REPORT  PATIENT: Theresa Gibson, Theresa Gibson  MR#: [id number] BIRTHDATE: Sep 04, 1956 , 59  yrs. old GENDER: female ENDOSCOPIST: Jerene Bears, MD REFERRED BY: Mauricio Po, FNP PROCEDURE DATE:  08/10/2015 PROCEDURE:   Colonoscopy, screening, Colonoscopy with biopsy, and Colonoscopy with snare polypectomy First Screening Colonoscopy - Avg.  risk and is 50 yrs.  old or older Yes.  Prior Negative Screening - Now for repeat screening. N/A  History of Adenoma - Now for follow-up colonoscopy & has been > or = to 3 yrs.  N/A  Polyps removed today? Yes Polyps Removed Today ASA CLASS:   Class II INDICATIONS:Screening for colonic neoplasia and Colorectal Neoplasm Risk Assessment for this procedure is average risk. MEDICATIONS: Monitored anesthesia care and Propofol 230 mg IV  DESCRIPTION OF PROCEDURE:   After the risks benefits and alternatives of the procedure were thoroughly explained, informed consent was obtained.  The digital rectal exam revealed no rectal mass.   The LB [id number]-H190 [id number]  endoscope was introduced through the anus and advanced to the cecum, which was identified by both the appendix and ileocecal valve. No adverse events experienced. The quality of the prep was good.  (Suprep was used)  The instrument was then slowly withdrawn as the colon was fully examined. Estimated blood loss is zero unless otherwise noted in this procedure report.  COLON FINDINGS: A sessile polyp measuring 6 mm in size with a mucous cap was found in the ascending colon.  A polypectomy was performed with a cold snare.  The resection was complete, the polyp tissue was completely retrieved and sent to histology.    Patchy inflammation with scattered erosions were found at the cecum, in the ascending colon, and proximal transverse colon.  Multiple biopsies of the area were performed using cold forceps.   The examination was  otherwise normal.  Retroflexed views revealed internal hemorrhoids. The time to cecum = 2.3 Withdrawal time = 9.5 The scope was withdrawn and the procedure completed. COMPLICATIONS: There were no immediate complications.  ENDOSCOPIC IMPRESSION: 1.   Sessile polyp was found in the ascending colon; polypectomy was performed with a cold snare 2.   Patchy inflammation with scattered erosions were found at the cecum, in the ascending colon, and proximal transverse colon; multiple biopsies of the area were performed using cold forceps 3.   The examination was otherwise normal  RECOMMENDATIONS: 1.  Await pathology results 2.  Timing of repeat colonoscopy will be determined by pathology findings. 3.  You will receive a letter within 1-2 weeks with the results of your biopsy as well as final recommendations.  Please call my office if you have not received a letter after 3 weeks.  eSigned:  Jerene Bears, MD 08/10/2015 8:30 AM  cc:  the patient, Terri Piedra, FNP

## 2015-08-10 NOTE — Progress Notes (Signed)
Called to room to assist during endoscopic procedure.  Patient ID and intended procedure confirmed with present staff. Received instructions for my participation in the procedure from the performing physician.  

## 2015-08-10 NOTE — Progress Notes (Signed)
Report to PACU, RN, vss, BBS= Clear.  

## 2015-08-10 NOTE — Patient Instructions (Signed)
YOU HAD AN ENDOSCOPIC PROCEDURE TODAY AT THE [facility]:   Refer to the procedure report that was given to you for any specific questions about what was found during the examination.  If the procedure report does not answer your questions, please call your gastroenterologist to clarify.  If you requested that your care partner not be given the details of your procedure findings, then the procedure report has been included in a sealed envelope for you to review at your convenience later.  YOU SHOULD EXPECT: Some feelings of bloating in the abdomen. Passage of more gas than usual.  Walking can help get rid of the air that was put into your GI tract during the procedure and reduce the bloating. If you had a lower endoscopy (such as a colonoscopy or flexible sigmoidoscopy) you may notice spotting of blood in your stool or on the toilet paper. If you underwent a bowel prep for your procedure, you may not have a normal bowel movement for a few days.  Please Note:  You might notice some irritation and congestion in your nose or some drainage.  This is from the oxygen used during your procedure.  There is no need for concern and it should clear up in a day or so.  SYMPTOMS TO REPORT IMMEDIATELY:   Following lower endoscopy (colonoscopy or flexible sigmoidoscopy):  Excessive amounts of blood in the stool  Significant tenderness or worsening of abdominal pains  Swelling of the abdomen that is new, acute  Fever of 100F or higher   For urgent or emergent issues, a gastroenterologist can be reached at any hour by calling [phone].   DIET: Your first meal following the procedure should be a small meal and then it is ok to progress to your normal diet. Heavy or fried foods are harder to digest and may make you feel nauseous or bloated.  Likewise, meals heavy in dairy and vegetables can increase bloating.  Drink plenty of fluids but you should avoid alcoholic beverages for 24  hours.  ACTIVITY:  You should plan to take it easy for the rest of today and you should NOT DRIVE or use heavy machinery until tomorrow (because of the sedation medicines used during the test).    FOLLOW UP: Our staff will call the number listed on your records the next business day following your procedure to check on you and address any questions or concerns that you may have regarding the information given to you following your procedure. If we do not reach you, we will leave a message.  However, if you are feeling well and you are not experiencing any problems, there is no need to return our call.  We will assume that you have returned to your regular daily activities without incident.  If any biopsies were taken you will be contacted by phone or by letter within the next 1-3 weeks.  Please call us at [phone] if you have not heard about the biopsies in 3 weeks.    SIGNATURES/CONFIDENTIALITY: You and/or your care partner have signed paperwork which will be entered into your electronic medical record.  These signatures attest to the fact that that the information above on your After Visit Summary has been reviewed and is understood.  Full responsibility of the confidentiality of this discharge information lies with you and/or your care-partner. 

## 2015-08-11 ENCOUNTER — Telehealth: Payer: Self-pay | Admitting: Emergency Medicine

## 2015-08-11 NOTE — Telephone Encounter (Signed)
Left message, no identifier 

## 2015-09-13 ENCOUNTER — Encounter: Payer: Self-pay | Admitting: Family

## 2015-09-13 ENCOUNTER — Ambulatory Visit (INDEPENDENT_AMBULATORY_CARE_PROVIDER_SITE_OTHER): Payer: BLUE CROSS/BLUE SHIELD | Admitting: Family

## 2015-09-13 ENCOUNTER — Other Ambulatory Visit: Payer: Worker's Compensation

## 2015-09-13 VITALS — BP 120/80 | HR 78 | Temp 97.7°F | Resp 18 | Ht 64.0 in | Wt 229.0 lb

## 2015-09-13 DIAGNOSIS — E559 Vitamin D deficiency, unspecified: Secondary | ICD-10-CM

## 2015-09-13 DIAGNOSIS — E669 Obesity, unspecified: Secondary | ICD-10-CM | POA: Diagnosis not present

## 2015-09-13 MED ORDER — PHENTERMINE HCL 30 MG PO CAPS: 30.0000 mg | ORAL_CAPSULE | ORAL | Status: DC

## 2015-09-13 NOTE — Assessment & Plan Note (Signed)
Previously diagnosed with Vitamin D Deficiency. Obtain Vitamin D to check current status. Treatment pending lab work.

## 2015-09-13 NOTE — Patient Instructions (Addendum)
Thank you for choosing [facility].  Summary/Instructions:  Please continue to take your medications as prescribed.   Please use MyFitnessPal or other calorie tracking software to ensure you are eating enough.   Your prescription(s) have been submitted to your pharmacy or been printed and provided for you. Please take as directed and contact our office if you believe you are having problem(s) with the medication(s) or have any questions.  Please stop by the lab on the basement level of the building for your blood work. Your results will be released to [place] (or called to you) after review, usually within 72 hours after test completion. If any changes need to be made, you will be notified at that same time.

## 2015-09-13 NOTE — Assessment & Plan Note (Signed)
No significant weight loss with trial of phentermine although notes improved overall feeling fit of clothes. Appears to have improved nutrition choices and physical activity. Discussed importance of lifestyle changes. Increase phentermine. Follow up in 1 month.

## 2015-09-13 NOTE — Progress Notes (Signed)
Pre visit review using our clinic review tool, if applicable. No additional management support is needed unless otherwise documented below in the visit note. 

## 2015-09-13 NOTE — Progress Notes (Signed)
Subjective:    Patient ID: Theresa Gibson, female    DOB: 12-30-1955, 59 y.o.   MRN: [id number]  Chief Complaint  Patient presents with  . Follow-up    wants to increase the dosage of phentermine if possible, she has been taking vitamin d for a week states that her level was low last time it was checked, wants to see if she can get it checked again    HPI:  Theresa Gibson is a 59 y.o. female who  has a past medical history of Hypertension; Arthritis; Depression; GERD (gastroesophageal reflux disease); PONV (postoperative nausea and vomiting); Meningioma ([place]); and Allergy. and presents today for a follow up.   1.)  Weight management - Previously started on low dose phentermine to assist with weight loss. Takes the medication as prescribed and denies adverse side effects. Has been increasing her physical activity and reports eating better. Notes that her clothes are feeling better. Currently walking 2x per week and is getting ready to start water aerobics. Nutritionally she is eating 2 meals per day consisting of fruits and vegetabes.   Wt Readings from Last 3 Encounters:  09/13/15 229 lb (103.874 kg)  08/10/15 226 lb (102.513 kg)  07/26/15 228 lb (103.42 kg)   2.) Vitamin D Deficinceny - Previously noted that she had been diagnosed with vitamin D deficiency. Requesting her vitamin D levels to check the need for continued supplementation.   Allergies  Allergen Reactions  . Ace Inhibitors     REACTION: Dry cough-lisinopril      Current Outpatient Prescriptions on File Prior to Visit  Medication Sig Dispense Refill  . cetirizine (ZYRTEC) 10 MG tablet Take 10 mg by mouth daily.    Marland Kitchen topiramate (TOPAMAX) 50 MG tablet Take 1 tablet (50 mg total) by mouth 2 (two) times daily. 180 tablet 0  . venlafaxine XR (EFFEXOR-XR) 37.5 MG 24 hr capsule Take 1 capsule (37.5 mg total) by mouth daily with breakfast. 30 capsule 1   No current facility-administered medications on file prior to visit.      Past Surgical History  Procedure Laterality Date  . Knee surgery Bilateral     arthroscopy  . Abdominal hysterectomy      Total     Review of Systems  Constitutional: Negative for appetite change and unexpected weight change.  Eyes:       Negative for changes in vision.   Respiratory: Negative for chest tightness and shortness of breath.   Cardiovascular: Negative for chest pain, palpitations and leg swelling.  Neurological: Negative for headaches.  Psychiatric/Behavioral: Negative for sleep disturbance. The patient is not nervous/anxious.       Objective:    BP 120/80 mmHg  Pulse 78  Temp(Src) 97.7 F (36.5 C) (Oral)  Resp 18  Ht 5\' 4"  (1.626 m)  Wt 229 lb (103.874 kg)  BMI 39.29 kg/m2  SpO2 98% Nursing note and vital signs reviewed.  Physical Exam  Constitutional: She is oriented to person, place, and time. She appears well-developed and well-nourished. No distress.  Cardiovascular: Normal rate, regular rhythm, normal heart sounds and intact distal pulses.   Pulmonary/Chest: Effort normal and breath sounds normal.  Neurological: She is alert and oriented to person, place, and time.  Skin: Skin is warm and dry.  Psychiatric: She has a normal mood and affect. Her behavior is normal. Judgment and thought content normal.       Assessment & Plan:   Problem List Items Addressed This Visit  Other   Obesity    No significant weight loss with trial of phentermine although notes improved overall feeling fit of clothes. Appears to have improved nutrition choices and physical activity. Discussed importance of lifestyle changes. Increase phentermine. Follow up in 1 month.       Relevant Medications   phentermine 30 MG capsule   Vitamin D deficiency - Primary    Previously diagnosed with Vitamin D Deficiency. Obtain Vitamin D to check current status. Treatment pending lab work.       Relevant Orders   Vitamin D 1,25 dihydroxy

## 2015-09-15 ENCOUNTER — Telehealth: Payer: Self-pay | Admitting: *Deleted

## 2015-09-15 NOTE — Telephone Encounter (Signed)
The vitamin D is still in process and may take a couple more days to come back. I will notify her when we receive it.

## 2015-09-15 NOTE — Telephone Encounter (Signed)
Left msg on triage requesting lab result from 09/13/15...Theresa Gibson

## 2015-09-16 ENCOUNTER — Telehealth: Payer: Self-pay | Admitting: *Deleted

## 2015-09-16 ENCOUNTER — Encounter: Payer: Self-pay | Admitting: Family

## 2015-09-16 LAB — VITAMIN D 1,25 DIHYDROXY
VITAMIN D3 1, 25 (OH): 76 pg/mL
Vitamin D 1, 25 (OH)2 Total: 76 pg/mL — ABNORMAL HIGH (ref 18–72)
Vitamin D2 1, 25 (OH)2: <8 pg/mL

## 2015-09-16 MED ORDER — TOPIRAMATE 50 MG PO TABS: 50.0000 mg | ORAL_TABLET | Freq: Two times a day (BID) | ORAL | Status: DC

## 2015-09-16 NOTE — Telephone Encounter (Signed)
Notified pt with Theresa Gibson response. Pt also stated she need rx for Topamax sent to her mail service inform will send...[name]

## 2015-09-16 NOTE — Telephone Encounter (Signed)
Notified pt with Theresa Gibson response. Pt states she also need her Topamax sent to her mail service. Use to be call [facility], names has change to [facility] fax# [phone]. Sent electronically...[name]

## 2015-12-10 ENCOUNTER — Emergency Department (HOSPITAL_COMMUNITY): Payer: BLUE CROSS/BLUE SHIELD

## 2015-12-10 ENCOUNTER — Observation Stay (HOSPITAL_COMMUNITY)
Admission: EM | Admit: 2015-12-10 | Discharge: 2015-12-11 | Disposition: A | Discharge status: Home / Self Care | Payer: BLUE CROSS/BLUE SHIELD | Attending: General Surgery | Admitting: General Surgery

## 2015-12-10 ENCOUNTER — Encounter (HOSPITAL_COMMUNITY): Payer: Self-pay

## 2015-12-10 ENCOUNTER — Emergency Department (HOSPITAL_COMMUNITY): Payer: BLUE CROSS/BLUE SHIELD | Admitting: Anesthesiology

## 2015-12-10 ENCOUNTER — Encounter (HOSPITAL_COMMUNITY)
Admission: EM | Disposition: A | Discharge status: Home / Self Care | Payer: Self-pay | Source: Home / Self Care | Attending: Emergency Medicine

## 2015-12-10 DIAGNOSIS — M1990 Unspecified osteoarthritis, unspecified site: Secondary | ICD-10-CM | POA: Insufficient documentation

## 2015-12-10 DIAGNOSIS — I10 Essential (primary) hypertension: Secondary | ICD-10-CM | POA: Insufficient documentation

## 2015-12-10 DIAGNOSIS — Z90710 Acquired absence of both cervix and uterus: Secondary | ICD-10-CM | POA: Insufficient documentation

## 2015-12-10 DIAGNOSIS — K219 Gastro-esophageal reflux disease without esophagitis: Secondary | ICD-10-CM | POA: Insufficient documentation

## 2015-12-10 DIAGNOSIS — F329 Major depressive disorder, single episode, unspecified: Secondary | ICD-10-CM | POA: Insufficient documentation

## 2015-12-10 DIAGNOSIS — R1031 Right lower quadrant pain: Secondary | ICD-10-CM | POA: Diagnosis present

## 2015-12-10 DIAGNOSIS — F17210 Nicotine dependence, cigarettes, uncomplicated: Secondary | ICD-10-CM | POA: Diagnosis not present

## 2015-12-10 DIAGNOSIS — K3580 Unspecified acute appendicitis: Secondary | ICD-10-CM | POA: Diagnosis not present

## 2015-12-10 DIAGNOSIS — Z888 Allergy status to other drugs, medicaments and biological substances status: Secondary | ICD-10-CM | POA: Insufficient documentation

## 2015-12-10 HISTORY — PX: LAPAROSCOPIC APPENDECTOMY: SHX408

## 2015-12-10 LAB — COMPREHENSIVE METABOLIC PANEL
ALBUMIN: 3.5 g/dL (ref 3.5–5.0)
ALT: 32 U/L (ref 14–54)
ANION GAP: 9 (ref 5–15)
AST: 42 U/L — ABNORMAL HIGH (ref 15–41)
Alkaline Phosphatase: 86 U/L (ref 38–126)
BUN: 17 mg/dL (ref 6–20)
CALCIUM: 9 mg/dL (ref 8.9–10.3)
CHLORIDE: 108 mmol/L (ref 101–111)
CO2: 22 mmol/L (ref 22–32)
Creatinine, Ser: 0.63 mg/dL (ref 0.44–1.00)
GFR calc non Af Amer: >60 mL/min (ref >60)
Glucose, Bld: 141 mg/dL — ABNORMAL HIGH (ref 65–99)
Potassium: 5.3 mmol/L — ABNORMAL HIGH (ref 3.5–5.1)
SODIUM: 139 mmol/L (ref 135–145)
TOTAL PROTEIN: 6.3 g/dL — AB (ref 6.5–8.1)
Total Bilirubin: 1.4 mg/dL — ABNORMAL HIGH (ref 0.3–1.2)

## 2015-12-10 LAB — LIPASE, BLOOD: LIPASE: 24 U/L (ref 11–51)

## 2015-12-10 LAB — CBC
HCT: 39.9 % (ref 36.0–46.0)
Hemoglobin: 13.4 g/dL (ref 12.0–15.0)
MCH: 29.1 pg (ref 26.0–34.0)
MCHC: 33.6 g/dL (ref 30.0–36.0)
MCV: 86.6 fL (ref 78.0–100.0)
PLATELETS: 268 10*3/uL (ref 150–400)
RBC: 4.61 MIL/uL (ref 3.87–5.11)
RDW: 15.2 % (ref 11.5–15.5)
WBC: 14.4 10*3/uL — AB (ref 4.0–10.5)

## 2015-12-10 LAB — URINALYSIS, ROUTINE W REFLEX MICROSCOPIC
BILIRUBIN URINE: NEGATIVE
Glucose, UA: NEGATIVE mg/dL
Hgb urine dipstick: NEGATIVE
KETONES UR: NEGATIVE mg/dL
LEUKOCYTES UA: NEGATIVE
NITRITE: NEGATIVE
PROTEIN: NEGATIVE mg/dL
Specific Gravity, Urine: 1.016 (ref 1.005–1.030)
pH: 7.5 (ref 5.0–8.0)

## 2015-12-10 SURGERY — APPENDECTOMY, LAPAROSCOPIC
Anesthesia: General | Site: Abdomen

## 2015-12-10 MED ORDER — DEXTROSE 5 % IV SOLN
2.0000 g | Freq: Two times a day (BID) | INTRAVENOUS | Status: DC
  Filled 2015-12-10 (×2): qty 2

## 2015-12-10 MED ORDER — MORPHINE SULFATE (PF) 4 MG/ML IV SOLN
4.0000 mg | Freq: Once | INTRAVENOUS | Status: AC
  Administered 2015-12-10: 4 mg via INTRAVENOUS
  Filled 2015-12-10: qty 1

## 2015-12-10 MED ORDER — KCL IN DEXTROSE-NACL 20-5-0.9 MEQ/L-%-% IV SOLN
INTRAVENOUS | Status: DC
  Administered 2015-12-10 – 2015-12-11 (×2): via INTRAVENOUS
  Filled 2015-12-10 (×3): qty 1000

## 2015-12-10 MED ORDER — LACTATED RINGERS IV SOLN
INTRAVENOUS | Status: DC | PRN
  Administered 2015-12-10: 18:00:00 via INTRAVENOUS

## 2015-12-10 MED ORDER — SODIUM CHLORIDE 0.9 % IV BOLUS (SEPSIS)
500.0000 mL | Freq: Once | INTRAVENOUS | Status: AC
  Administered 2015-12-10: 500 mL via INTRAVENOUS

## 2015-12-10 MED ORDER — ONDANSETRON HCL 4 MG/2ML IJ SOLN
INTRAMUSCULAR | Status: DC | PRN
  Administered 2015-12-10: 4 mg via INTRAVENOUS

## 2015-12-10 MED ORDER — MIDAZOLAM HCL 5 MG/5ML IJ SOLN
INTRAMUSCULAR | Status: DC | PRN
  Administered 2015-12-10: 2 mg via INTRAVENOUS

## 2015-12-10 MED ORDER — HYDROMORPHONE HCL 1 MG/ML IJ SOLN: 0.2500 mg | INTRAMUSCULAR | Status: DC | PRN

## 2015-12-10 MED ORDER — SODIUM CHLORIDE 0.9 % IR SOLN
Status: DC | PRN
  Administered 2015-12-10: 1000 mL

## 2015-12-10 MED ORDER — NEOSTIGMINE METHYLSULFATE 10 MG/10ML IV SOLN
INTRAVENOUS | Status: AC
  Filled 2015-12-10: qty 1

## 2015-12-10 MED ORDER — METHOCARBAMOL 500 MG PO TABS: 500.0000 mg | ORAL_TABLET | Freq: Four times a day (QID) | ORAL | Status: DC | PRN

## 2015-12-10 MED ORDER — LIDOCAINE HCL (CARDIAC) 20 MG/ML IV SOLN
INTRAVENOUS | Status: AC
  Filled 2015-12-10: qty 5

## 2015-12-10 MED ORDER — OXYCODONE HCL 5 MG PO TABS: 5.0000 mg | ORAL_TABLET | ORAL | Status: DC | PRN

## 2015-12-10 MED ORDER — BUPIVACAINE-EPINEPHRINE (PF) 0.5% -1:200000 IJ SOLN
INTRAMUSCULAR | Status: AC
  Filled 2015-12-10: qty 30

## 2015-12-10 MED ORDER — ONDANSETRON HCL 4 MG/2ML IJ SOLN
4.0000 mg | Freq: Four times a day (QID) | INTRAMUSCULAR | Status: DC | PRN
  Administered 2015-12-11 (×2): 4 mg via INTRAVENOUS
  Filled 2015-12-10 (×2): qty 2

## 2015-12-10 MED ORDER — ZOLPIDEM TARTRATE 5 MG PO TABS: 5.0000 mg | ORAL_TABLET | Freq: Every evening | ORAL | Status: DC | PRN

## 2015-12-10 MED ORDER — CEFOTETAN DISODIUM-DEXTROSE 2-2.08 GM-% IV SOLR
INTRAVENOUS | Status: AC
  Filled 2015-12-10: qty 50

## 2015-12-10 MED ORDER — SUGAMMADEX SODIUM 200 MG/2ML IV SOLN
INTRAVENOUS | Status: AC
  Filled 2015-12-10: qty 2

## 2015-12-10 MED ORDER — KCL IN DEXTROSE-NACL 20-5-0.9 MEQ/L-%-% IV SOLN: INTRAVENOUS | Status: DC

## 2015-12-10 MED ORDER — CEFOTETAN DISODIUM-DEXTROSE 2-2.08 GM-% IV SOLR
2.0000 g | INTRAVENOUS | Status: AC
  Administered 2015-12-10: 2 g via INTRAVENOUS

## 2015-12-10 MED ORDER — ROCURONIUM BROMIDE 100 MG/10ML IV SOLN
INTRAVENOUS | Status: DC | PRN
  Administered 2015-12-10: 10 mg via INTRAVENOUS
  Administered 2015-12-10: 30 mg via INTRAVENOUS

## 2015-12-10 MED ORDER — ONDANSETRON HCL 4 MG/2ML IJ SOLN
4.0000 mg | Freq: Once | INTRAMUSCULAR | Status: AC
  Administered 2015-12-10: 4 mg via INTRAVENOUS
  Filled 2015-12-10: qty 2

## 2015-12-10 MED ORDER — HYDROMORPHONE HCL 1 MG/ML IJ SOLN
1.0000 mg | INTRAMUSCULAR | Status: DC | PRN
  Administered 2015-12-10 – 2015-12-11 (×3): 1 mg via INTRAVENOUS
  Filled 2015-12-10 (×3): qty 1

## 2015-12-10 MED ORDER — SUCCINYLCHOLINE CHLORIDE 20 MG/ML IJ SOLN
INTRAMUSCULAR | Status: AC
  Filled 2015-12-10: qty 3

## 2015-12-10 MED ORDER — ENOXAPARIN SODIUM 40 MG/0.4ML ~~LOC~~ SOLN: 40.0000 mg | SUBCUTANEOUS | Status: DC

## 2015-12-10 MED ORDER — FENTANYL CITRATE (PF) 250 MCG/5ML IJ SOLN
INTRAMUSCULAR | Status: AC
  Filled 2015-12-10: qty 5

## 2015-12-10 MED ORDER — HYDROMORPHONE HCL 1 MG/ML IJ SOLN: 1.0000 mg | INTRAMUSCULAR | Status: DC | PRN

## 2015-12-10 MED ORDER — 0.9 % SODIUM CHLORIDE (POUR BTL) OPTIME
TOPICAL | Status: DC | PRN
  Administered 2015-12-10: 1000 mL

## 2015-12-10 MED ORDER — ONDANSETRON 4 MG PO TBDP: 4.0000 mg | ORAL_TABLET | Freq: Four times a day (QID) | ORAL | Status: DC | PRN

## 2015-12-10 MED ORDER — VENLAFAXINE HCL ER 37.5 MG PO CP24
37.5000 mg | ORAL_CAPSULE | Freq: Every day | ORAL | Status: DC
  Filled 2015-12-10 (×2): qty 1

## 2015-12-10 MED ORDER — ONDANSETRON 4 MG PO TBDP
4.0000 mg | ORAL_TABLET | Freq: Four times a day (QID) | ORAL | Status: DC | PRN
  Filled 2015-12-10: qty 1

## 2015-12-10 MED ORDER — DEXAMETHASONE SODIUM PHOSPHATE 4 MG/ML IJ SOLN
INTRAMUSCULAR | Status: DC | PRN
  Administered 2015-12-10: 4 mg via INTRAVENOUS

## 2015-12-10 MED ORDER — SIMETHICONE 80 MG PO CHEW: 40.0000 mg | CHEWABLE_TABLET | Freq: Four times a day (QID) | ORAL | Status: DC | PRN

## 2015-12-10 MED ORDER — SUGAMMADEX SODIUM 200 MG/2ML IV SOLN
INTRAVENOUS | Status: DC | PRN
  Administered 2015-12-10: 200 mg via INTRAVENOUS

## 2015-12-10 MED ORDER — MIDAZOLAM HCL 2 MG/2ML IJ SOLN
INTRAMUSCULAR | Status: AC
  Filled 2015-12-10: qty 2

## 2015-12-10 MED ORDER — DIPHENHYDRAMINE HCL 50 MG/ML IJ SOLN: 12.5000 mg | Freq: Four times a day (QID) | INTRAMUSCULAR | Status: DC | PRN

## 2015-12-10 MED ORDER — ONDANSETRON HCL 4 MG/2ML IJ SOLN: 4.0000 mg | Freq: Four times a day (QID) | INTRAMUSCULAR | Status: DC | PRN

## 2015-12-10 MED ORDER — BUPIVACAINE-EPINEPHRINE 0.5% -1:200000 IJ SOLN
INTRAMUSCULAR | Status: DC | PRN
  Administered 2015-12-10: 4 mL

## 2015-12-10 MED ORDER — DEXTROSE 5 % IV SOLN
2.0000 g | Freq: Two times a day (BID) | INTRAVENOUS | Status: AC
  Administered 2015-12-11: 2 g via INTRAVENOUS
  Filled 2015-12-10: qty 2

## 2015-12-10 MED ORDER — LIDOCAINE HCL (CARDIAC) 20 MG/ML IV SOLN
INTRAVENOUS | Status: DC | PRN
  Administered 2015-12-10: 80 mg via INTRATRACHEAL

## 2015-12-10 MED ORDER — OXYCODONE HCL 5 MG PO TABS
5.0000 mg | ORAL_TABLET | ORAL | Status: DC | PRN
  Administered 2015-12-11: 5 mg via ORAL
  Filled 2015-12-10: qty 1

## 2015-12-10 MED ORDER — DIPHENHYDRAMINE HCL 12.5 MG/5ML PO ELIX: 12.5000 mg | ORAL_SOLUTION | Freq: Four times a day (QID) | ORAL | Status: DC | PRN

## 2015-12-10 MED ORDER — PROPOFOL 10 MG/ML IV BOLUS
INTRAVENOUS | Status: DC | PRN
  Administered 2015-12-10: 170 mg via INTRAVENOUS

## 2015-12-10 MED ORDER — IOHEXOL 300 MG/ML  SOLN
100.0000 mL | Freq: Once | INTRAMUSCULAR | Status: AC | PRN
  Administered 2015-12-10: 100 mL via INTRAVENOUS

## 2015-12-10 MED ORDER — PHENTERMINE HCL 30 MG PO CAPS: 30.0000 mg | ORAL_CAPSULE | ORAL | Status: DC

## 2015-12-10 MED ORDER — HYDRALAZINE HCL 20 MG/ML IJ SOLN: 10.0000 mg | INTRAMUSCULAR | Status: DC | PRN

## 2015-12-10 MED ORDER — SUCCINYLCHOLINE CHLORIDE 20 MG/ML IJ SOLN
INTRAMUSCULAR | Status: DC | PRN
  Administered 2015-12-10: 100 mg via INTRAVENOUS

## 2015-12-10 MED ORDER — FENTANYL CITRATE (PF) 250 MCG/5ML IJ SOLN
INTRAMUSCULAR | Status: DC | PRN
  Administered 2015-12-10 (×2): 100 ug via INTRAVENOUS
  Administered 2015-12-10: 50 ug via INTRAVENOUS

## 2015-12-10 SURGICAL SUPPLY — 44 items
APPLIER CLIP ROT 10 11.4 M/L (STAPLE)
APR CLP MED LRG 11.4X10 (STAPLE)
BAG SPEC RTRVL LRG 6X4 10 (ENDOMECHANICALS) ×1
BLADE SURG ROTATE 9660 (MISCELLANEOUS) IMPLANT
CANISTER SUCTION 2500CC (MISCELLANEOUS) ×2 IMPLANT
CHLORAPREP W/TINT 26ML (MISCELLANEOUS) ×2 IMPLANT
CLIP APPLIE ROT 10 11.4 M/L (STAPLE) IMPLANT
COVER SURGICAL LIGHT HANDLE (MISCELLANEOUS) ×2 IMPLANT
CUTTER FLEX LINEAR 45M (STAPLE) ×2 IMPLANT
DRAPE WARM FLUID 44X44 (DRAPE) ×1 IMPLANT
ELECT REM PT RETURN 9FT ADLT (ELECTROSURGICAL) ×2
ELECTRODE REM PT RTRN 9FT ADLT (ELECTROSURGICAL) ×1 IMPLANT
ENDOLOOP SUT PDS II  0 18 (SUTURE)
ENDOLOOP SUT PDS II 0 18 (SUTURE) IMPLANT
GLOVE BIO SURGEON STRL SZ7.5 (GLOVE) ×1 IMPLANT
GLOVE BIO SURGEON STRL SZ8 (GLOVE) ×2 IMPLANT
GLOVE BIOGEL PI IND STRL 7.5 (GLOVE) IMPLANT
GLOVE BIOGEL PI IND STRL 8 (GLOVE) ×1 IMPLANT
GLOVE BIOGEL PI INDICATOR 7.5 (GLOVE) ×1
GLOVE BIOGEL PI INDICATOR 8 (GLOVE) ×2
GOWN STRL REUS W/ TWL LRG LVL3 (GOWN DISPOSABLE) ×2 IMPLANT
GOWN STRL REUS W/ TWL XL LVL3 (GOWN DISPOSABLE) ×1 IMPLANT
GOWN STRL REUS W/TWL 2XL LVL3 (GOWN DISPOSABLE) ×1 IMPLANT
GOWN STRL REUS W/TWL LRG LVL3 (GOWN DISPOSABLE) ×2
GOWN STRL REUS W/TWL XL LVL3 (GOWN DISPOSABLE) ×2
KIT BASIN OR (CUSTOM PROCEDURE TRAY) ×2 IMPLANT
KIT ROOM TURNOVER OR (KITS) ×2 IMPLANT
LIQUID BAND (GAUZE/BANDAGES/DRESSINGS) ×2 IMPLANT
NS IRRIG 1000ML POUR BTL (IV SOLUTION) ×2 IMPLANT
PAD ARMBOARD 7.5X6 YLW CONV (MISCELLANEOUS) ×4 IMPLANT
POUCH SPECIMEN RETRIEVAL 10MM (ENDOMECHANICALS) ×2 IMPLANT
RELOAD STAPLE 45 3.5 BLU ETS (ENDOMECHANICALS) ×1 IMPLANT
RELOAD STAPLE TA45 3.5 REG BLU (ENDOMECHANICALS) ×2 IMPLANT
SCALPEL HARMONIC ACE (MISCELLANEOUS) ×2 IMPLANT
SET IRRIG TUBING LAPAROSCOPIC (IRRIGATION / IRRIGATOR) ×2 IMPLANT
SPECIMEN JAR SMALL (MISCELLANEOUS) ×2 IMPLANT
SUT MON AB 4-0 PC3 18 (SUTURE) ×2 IMPLANT
TOWEL OR 17X24 6PK STRL BLUE (TOWEL DISPOSABLE) ×2 IMPLANT
TOWEL OR 17X26 10 PK STRL BLUE (TOWEL DISPOSABLE) ×2 IMPLANT
TRAY FOLEY CATH 16FR SILVER (SET/KITS/TRAYS/PACK) ×1 IMPLANT
TRAY LAPAROSCOPIC MC (CUSTOM PROCEDURE TRAY) ×2 IMPLANT
TROCAR XCEL BLADELESS 5X75MML (TROCAR) ×4 IMPLANT
TROCAR XCEL BLUNT TIP 100MML (ENDOMECHANICALS) ×2 IMPLANT
TUBING INSUFFLATION (TUBING) ×2 IMPLANT

## 2015-12-10 NOTE — ED Notes (Addendum)
Onset 5am abd pain.  Last BM today, normal.  No vomiting, diarrhea, cough/cough/sore throat symptoms or urinary symptoms.  Pt took Ibuprofen with no relief.  Yesterday pt c/o heart palpitations, none today.

## 2015-12-10 NOTE — Transfer of Care (Signed)
Immediate Anesthesia Transfer of Care Note  Patient: Theresa Gibson  Procedure(s) Performed: Procedure(s): APPENDECTOMY LAPAROSCOPIC (N/A)  Patient Location: PACU  Anesthesia Type:General  Level of Consciousness: sedated  Airway & Oxygen Therapy: Patient Spontanous Breathing and Patient connected to face mask oxygen  Post-op Assessment: Report given to RN and Post -op Vital signs reviewed and stable  Post vital signs: Reviewed and stable  Last Vitals:  Filed Vitals:   12/10/15 1708 12/10/15 1930  BP: 151/78   Pulse: 67   Temp:  36.2 C  Resp: 17     Complications: No apparent anesthesia complications

## 2015-12-10 NOTE — ED Notes (Signed)
Pt off unit with CT 

## 2015-12-10 NOTE — ED Notes (Signed)
Patient has returned from the bathroom; patient placed back on monitor, continuous pulse oximetry and blood pressure cuff; Cornett, MD (surgury) in speaking with the patient

## 2015-12-10 NOTE — H&P (Signed)
Theresa Gibson is an 60 y.o. female.   Chief Complaint: Abdominal pain HPI:  Patient presents emergency room with a follow her history of right lower quadrant abdominal pain. The pain started this morning at 5:00. It progressed as the day went on became more sharp and localized to right lower quadrant. She had some diarrhea about 3 weeks ago that resolved. She's not been vomiting. The pain is sharp in nature location right quadrant made worse with movement. CT scan shows acute appendicitis without perforation. Past Medical History  Diagnosis Date  . Hypertension   . Arthritis   . Depression   . GERD (gastroesophageal reflux disease)   . PONV (postoperative nausea and vomiting)     nausea only  . Meningioma ([place])   . Allergy     Past Surgical History  Procedure Laterality Date  . Knee surgery Bilateral     arthroscopy  . Abdominal hysterectomy      Total     Family History  Problem Relation Age of Onset  . Hypertension Mother   . Arthritis Mother   . Heart attack Father     In his 6's  . Ovarian cancer Maternal Grandmother   . Colon cancer Neg Hx   . Colon polyps Neg Hx   . Rectal cancer Neg Hx   . Stomach cancer Neg Hx    Social History:  reports that she has been smoking Cigarettes.  She has a 15 pack-year smoking history. She has never used smokeless tobacco. She reports that she drinks alcohol. She reports that she does not use illicit drugs.  Allergies:  Allergies  Allergen Reactions  . Ace Inhibitors Cough    REACTION: Dry cough-lisinopril      (Not in a hospital admission)  Results for orders placed or performed during the hospital encounter of 12/10/15 (from the past 48 hour(s))  Urinalysis, Routine w reflex microscopic (not at [facility])     Status: None   Collection Time: 12/10/15  2:01 PM  Result Value Ref Range   Color, Urine YELLOW YELLOW   APPearance CLEAR CLEAR   Specific Gravity, Urine 1.016 1.005 - 1.030   pH 7.5 5.0 - 8.0   Glucose, UA NEGATIVE  NEGATIVE mg/dL   Hgb urine dipstick NEGATIVE NEGATIVE   Bilirubin Urine NEGATIVE NEGATIVE   Ketones, ur NEGATIVE NEGATIVE mg/dL   Protein, ur NEGATIVE NEGATIVE mg/dL   Nitrite NEGATIVE NEGATIVE   Leukocytes, UA NEGATIVE NEGATIVE    Comment: MICROSCOPIC NOT DONE ON URINES WITH NEGATIVE PROTEIN, BLOOD, LEUKOCYTES, NITRITE, OR GLUCOSE <1000 mg/dL.  Lipase, blood     Status: None   Collection Time: 12/10/15  3:00 PM  Result Value Ref Range   Lipase 24 11 - 51 U/L  Comprehensive metabolic panel     Status: Abnormal   Collection Time: 12/10/15  3:00 PM  Result Value Ref Range   Sodium 139 135 - 145 mmol/L   Potassium 5.3 (H) 3.5 - 5.1 mmol/L   Chloride 108 101 - 111 mmol/L   CO2 22 22 - 32 mmol/L   Glucose, Bld 141 (H) 65 - 99 mg/dL   BUN 17 6 - 20 mg/dL   Creatinine, Ser 0.63 0.44 - 1.00 mg/dL   Calcium 9.0 8.9 - 10.3 mg/dL   Total Protein 6.3 (L) 6.5 - 8.1 g/dL   Albumin 3.5 3.5 - 5.0 g/dL   AST 42 (H) 15 - 41 U/L   ALT 32 14 - 54 U/L   Alkaline  Phosphatase 86 38 - 126 U/L   Total Bilirubin 1.4 (H) 0.3 - 1.2 mg/dL   GFR calc non Af Amer >60 >60 mL/min   GFR calc Af Amer >60 >60 mL/min    Comment: (NOTE) The eGFR has been calculated using the CKD EPI equation. This calculation has not been validated in all clinical situations. eGFR's persistently <60 mL/min signify possible Chronic Kidney Disease.    Anion gap 9 5 - 15  CBC     Status: Abnormal   Collection Time: 12/10/15  3:00 PM  Result Value Ref Range   WBC 14.4 (H) 4.0 - 10.5 K/uL   RBC 4.61 3.87 - 5.11 MIL/uL   Hemoglobin 13.4 12.0 - 15.0 g/dL   HCT 39.9 36.0 - 46.0 %   MCV 86.6 78.0 - 100.0 fL   MCH 29.1 26.0 - 34.0 pg   MCHC 33.6 30.0 - 36.0 g/dL   RDW 15.2 11.5 - 15.5 %   Platelets 268 150 - 400 K/uL   Ct Abdomen Pelvis W Contrast  12/10/2015  CLINICAL DATA:  Nausea vomiting and diarrhea for 3 weeks, right-sided abdominal pain EXAM: CT ABDOMEN AND PELVIS WITH CONTRAST TECHNIQUE: Multidetector CT imaging of the  abdomen and pelvis was performed using the standard protocol following bolus administration of intravenous contrast. CONTRAST:  112m OMNIPAQUE IOHEXOL 300 MG/ML  SOLN COMPARISON:  None. FINDINGS: Lower chest:  No significant abnormalities Hepatobiliary: Mild diffuse hepatic steatosis. Pancreas: Neck Spleen: Negative Adrenals/Urinary Tract: Circumaortic left renal vein. Otherwise negative. Stomach/Bowel: Appendix is 13 mm in diameter. It arises medially off the tip of the cecum and extends inferomedially into the pelvis. There is mild surrounding inflammatory change with no free air or free fluid. No evidence of abscess. Vascular/Lymphatic: No acute findings Reproductive: Negative Other: None Musculoskeletal: No acute findings IMPRESSION: Acute appendicitis Electronically Signed   By: RSkipper ClicheM.D.   On: 12/10/2015 16:17    Review of Systems  Constitutional: Negative for fever and chills.  Eyes: Negative.   Respiratory: Negative.   Cardiovascular: Negative.   Gastrointestinal: Positive for nausea, abdominal pain and diarrhea.  Genitourinary: Negative.   Skin: Negative.   Psychiatric/Behavioral: Negative.     Blood pressure 151/78, pulse 67, temperature 98.3 F (36.8 C), temperature source Oral, resp. rate 17, height '5\' 4"'  (1.626 m), weight 104.554 kg (230 lb 8 oz), SpO2 100 %. Physical Exam  Constitutional: She is oriented to person, place, and time. She appears well-developed and well-nourished.  HENT:  Head: Normocephalic.  Eyes: Pupils are equal, round, and reactive to light.  Neck: Normal range of motion.  Cardiovascular: Normal rate.   Respiratory: Effort normal.  GI: There is tenderness in the right lower quadrant. There is tenderness at McBurney's point.  Musculoskeletal: Normal range of motion.  Neurological: She is alert and oriented to person, place, and time.  Skin: Skin is warm and dry.  Psychiatric: She has a normal mood and affect. Her behavior is normal. Judgment and  thought content normal.     Assessment/Plan Acute appendicitis  Discussed treatment options of medical management antibiotics versus surgical management with appendectomy. Laparoscopic and open techniques discussed. Pathophysiology and anatomy were discussed. Risk and benefits of each option were discussed. Medical management failure were discussed. Risk of appendectomy were discussed. Risk of bleeding, infection, organ injury, kidney injury, bladder injury, pelvic organ injury, ureter injury, need for open surgery, wound infections, hernia, blood clots, heart attack, stroke, death, bowel injury, bowel obstruction, any further operations discussed. Medical  management failure would be to surgery. After discussion of all the options she chose laparoscopic appendectomy.  Lassie Demorest A. 12/10/2015, 5:15 PM

## 2015-12-10 NOTE — Anesthesia Preprocedure Evaluation (Addendum)
Anesthesia Evaluation  Patient identified by MRN, date of birth, ID band Patient awake    Reviewed: Allergy & Precautions, NPO status , Patient's Chart, lab work & pertinent test results  History of Anesthesia Complications (+) PONV  Airway Mallampati: II  TM Distance: >3 FB Neck ROM: Full    Dental  (+) Teeth Intact, Dental Advisory Given   Pulmonary Current Smoker,    breath sounds clear to auscultation       Cardiovascular hypertension,  Rhythm:Regular Rate:Normal     Neuro/Psych    GI/Hepatic Neg liver ROS, GERD  ,  Endo/Other  negative endocrine ROS  Renal/GU negative Renal ROS     Musculoskeletal   Abdominal   Peds  Hematology   Anesthesia Other Findings   Reproductive/Obstetrics                            Anesthesia Physical Anesthesia Plan  ASA: III  Anesthesia Plan: General   Post-op Pain Management:    Induction: Intravenous  Airway Management Planned: Oral ETT  Additional Equipment:   Intra-op Plan:   Post-operative Plan: Extubation in OR  Informed Consent: I have reviewed the patients History and Physical, chart, labs and discussed the procedure including the risks, benefits and alternatives for the proposed anesthesia with the patient or authorized representative who has indicated his/her understanding and acceptance.   Dental advisory given  Plan Discussed with: CRNA, Anesthesiologist and Surgeon  Anesthesia Plan Comments:        Anesthesia Quick Evaluation

## 2015-12-10 NOTE — Op Note (Signed)
Appendectomy, Lap, Procedure Note  Indications: The patient presented with a history of right-sided abdominal pain. A CT scan revealed findings consistent with acute appendicitis. Discussed medical and surgical options for treatment of appendicitis as well as the pathophysiology disease, anatomy and potential complications of surgery. Risk of bleeding, infection, bowel injury, abscess, kidney injury, ureter injury, lateral injury, pelvic organ injury, hernia, the need for open surgery, death, DVT, ulnar, patient, cardiac risk of conditions. She agreed to proceed.  Pre-operative Diagnosis: Acute appendicitis without mention of peritonitis  Post-operative Diagnosis: Same  Surgeon: Briton Sellman A.   Assistants: None  Anesthesia: General endotracheal anesthesia and Local anesthesia 0.25.% bupivacaine, with epinephrine  ASA Class: 3  Procedure Details  The patient was seen again in the Holding Room. The risks, benefits, complications, treatment options, and expected outcomes were discussed with the patient and/or family. The possibilities of reaction to medication, pulmonary aspiration, perforation of viscus, bleeding, recurrent infection, finding a normal appendix, the need for additional procedures, failure to diagnose a condition, and creating a complication requiring transfusion or operation were discussed. There was concurrence with the proposed plan and informed consent was obtained. The site of surgery was properly noted/marked. The patient was taken to Operating Room, identified as Theresa Gibson and the procedure verified as Appendectomy. A Time Out was held and the above information confirmed.  The patient was placed in the supine position and general anesthesia was induced, along with placement of orogastric tube, Venodyne boots, and a Foley catheter. The abdomen was prepped and draped in a sterile fashion. A one centimeter infraumbilical incision was made and the peritoneal cavity was  accessed using the OPEN  technique. The pneumoperitoneum was then established to steady pressure of 12 mmHg. A 12 mm port was placed through the umbilical incision. Additional 5 mm cannulas then placed in the left lower quadrant of the abdomen right upper quadrant under direct vision. A careful evaluation of the entire abdomen was carried out. The patient was placed in Trendelenburg and left lateral decubitus position. The small intestines were retracted in the cephalad and left lateral direction away from the pelvis and right lower quadrant. The patient was found to have an enlarged and inflamed appendix that was extending into the pelvis. There was no evidence of perforation.  The appendix was carefully dissected. A window was made in the mesoappendix at the base of the appendix. A harmonic scalpel was used across the mesoappendix. The appendix was divided at its base using an endo-GIA stapler. Minimal appendiceal stump was left in place. There was no evidence of bleeding, leakage, or complication after division of the appendix. Irrigation was also performed and irrigate suctioned from the abdomen as well.  The umbilical port site was closed using 0 vicryl pursestring sutures fashion at the level of the fascia. The trocar site skin wounds were closed using skin staples.  Instrument, sponge, and needle counts were correct at the conclusion of the case.   Findings: The appendix was found to be inflamed. There were not signs of necrosis.  There was not perforation. There was not abscess formation.  Estimated Blood Loss:  Minimal         Drains: None         Total IV Fluids: 600 mL         Specimens: Appendix         Complications:  None; patient tolerated the procedure well.         Disposition: PACU - hemodynamically stable.  Condition: stable

## 2015-12-10 NOTE — ED Notes (Signed)
Patient up ambulatory to the bathroom at this time without any difficulty or distress 

## 2015-12-10 NOTE — ED Provider Notes (Signed)
Received patient in turnover from Dr. Tamera Punt, patient having right lower quadrant tenderness since this morning. CT scan concerning for acute appendicitis. Repeat abdominal exam with continued right lower quadrant tenderness no rebound. Surgery consultation will admit.  The patients results and plan were reviewed and discussed.   Any x-rays performed were independently reviewed by myself.   Differential diagnosis were considered with the presenting HPI.  Medications  morphine 4 MG/ML injection 4 mg (4 mg Intravenous Given 12/10/15 1623)  ondansetron (ZOFRAN) injection 4 mg (4 mg Intravenous Given 12/10/15 1623)  sodium chloride 0.9 % bolus 500 mL (0 mLs Intravenous Stopped 12/10/15 1706)  iohexol (OMNIPAQUE) 300 MG/ML solution 100 mL (100 mLs Intravenous Contrast Given 12/10/15 1557)    Filed Vitals:   12/10/15 1423 12/10/15 1530 12/10/15 1708  BP: 138/94 138/76 151/78  Pulse: 68 75 67  Temp: 98.3 F (36.8 C)    TempSrc: Oral    Resp: 16 25 17   Height: 5\' 4"  (1.626 m)    Weight: 230 lb 8 oz (104.554 kg)    SpO2: 100% 100% 100%    Final diagnoses:  None    Admission/ observation were discussed with the admitting physician, patient and/or family and they are comfortable with the plan.    Deno Etienne, DO 12/10/15 1710

## 2015-12-10 NOTE — Anesthesia Procedure Notes (Signed)
Procedure Name: Intubation Date/Time: 12/10/2015 6:16 PM Performed by: Maude Leriche D Pre-anesthesia Checklist: Patient identified, Emergency Drugs available, Suction available, Patient being monitored and Timeout performed Patient Re-evaluated:Patient Re-evaluated prior to inductionOxygen Delivery Method: Circle system utilized Preoxygenation: Pre-oxygenation with 100% oxygen Intubation Type: IV induction, Rapid sequence and Cricoid Pressure applied Laryngoscope Size: Miller and 2 Grade View: Grade I Tube type: Oral Tube size: 7.5 mm Number of attempts: 1 Airway Equipment and Method: Stylet Placement Confirmation: ETT inserted through vocal cords under direct vision,  positive ETCO2 and breath sounds checked- equal and bilateral Secured at: 22 cm Tube secured with: Tape Dental Injury: Teeth and Oropharynx as per pre-operative assessment

## 2015-12-10 NOTE — ED Provider Notes (Signed)
CSN: [id number]     Arrival date & time 12/10/15  1415 History   First MD Initiated Contact with Patient 12/10/15 1438     Chief Complaint  Patient presents with  . Abdominal Pain     (Consider location/radiation/quality/duration/timing/severity/associated sxs/prior Treatment) HPI Comments: Patient with a history of hypertension, GERD and depression presents with abdominal pain. She states about 3 weeks ago she had nausea and diarrhea which she thought was a stomach virus. She states that that completely resolved. She states at 5 AM this morning she developed onset of lower abdominal pain. It's been constant since that time. It radiates a little bit to her back. She denies any urinary symptoms. She's had some nausea but no vomiting. She's having normal bowel movements. She denies any vaginal bleeding or discharge. She is status post a hysterectomy but denies any other abdominal surgeries. She denies any known fevers.  Patient is a 59 y.o. female presenting with abdominal pain.  Abdominal Pain Associated symptoms: nausea   Associated symptoms: no chest pain, no chills, no cough, no diarrhea, no fatigue, no fever, no hematuria, no shortness of breath and no vomiting     Past Medical History  Diagnosis Date  . Hypertension   . Arthritis   . Depression   . GERD (gastroesophageal reflux disease)   . PONV (postoperative nausea and vomiting)     nausea only  . Meningioma ([place])   . Allergy    Past Surgical History  Procedure Laterality Date  . Knee surgery Bilateral     arthroscopy  . Abdominal hysterectomy      Total    Family History  Problem Relation Age of Onset  . Hypertension Mother   . Arthritis Mother   . Heart attack Father     In his 22's  . Ovarian cancer Maternal Grandmother   . Colon cancer Neg Hx   . Colon polyps Neg Hx   . Rectal cancer Neg Hx   . Stomach cancer Neg Hx    Social History  Substance Use Topics  . Smoking status: Current Every Day Smoker -- 0.50  packs/day for 30 years    Types: Cigarettes  . Smokeless tobacco: Never Used  . Alcohol Use: 0.0 oz/week    0 Standard drinks or equivalent per week     Comment: occasionall   OB History    No data available     Review of Systems  Constitutional: Negative for fever, chills, diaphoresis and fatigue.  HENT: Negative for congestion, rhinorrhea and sneezing.   Eyes: Negative.   Respiratory: Negative for cough, chest tightness and shortness of breath.   Cardiovascular: Negative for chest pain and leg swelling.  Gastrointestinal: Positive for nausea and abdominal pain. Negative for vomiting, diarrhea and blood in stool.  Genitourinary: Negative for frequency, hematuria, flank pain and difficulty urinating.  Musculoskeletal: Negative for back pain and arthralgias.  Skin: Negative for rash.  Neurological: Negative for dizziness, speech difficulty, weakness, numbness and headaches.      Allergies  Ace inhibitors  Home Medications   Prior to Admission medications   Medication Sig Start Date End Date Taking? Authorizing Provider  cetirizine (ZYRTEC) 10 MG tablet Take 10 mg by mouth daily.    Historical Provider, MD  phentermine 30 MG capsule Take 1 capsule (30 mg total) by mouth every morning. 09/13/15   Golden Circle, FNP  topiramate (TOPAMAX) 50 MG tablet Take 1 tablet (50 mg total) by mouth 2 (two) times daily. 09/16/15  Golden Circle, FNP  venlafaxine XR (EFFEXOR-XR) 37.5 MG 24 hr capsule Take 1 capsule (37.5 mg total) by mouth daily with breakfast. 07/26/15   Golden Circle, FNP   BP 138/94 mmHg  Pulse 68  Temp(Src) 98.3 F (36.8 C) (Oral)  Resp 16  Ht 5\' 4"  (1.626 m)  Wt 230 lb 8 oz (104.554 kg)  BMI 39.55 kg/m2  SpO2 100% Physical Exam  Constitutional: She is oriented to person, place, and time. She appears well-developed and well-nourished.  HENT:  Head: Normocephalic and atraumatic.  Eyes: Pupils are equal, round, and reactive to light.  Neck: Normal range of  motion. Neck supple.  Cardiovascular: Normal rate, regular rhythm and normal heart sounds.   Pulmonary/Chest: Effort normal and breath sounds normal. No respiratory distress. She has no wheezes. She has no rales. She exhibits no tenderness.  Abdominal: Soft. Bowel sounds are normal. There is tenderness (moderate TTP to RLQ). There is no rebound and no guarding.  Musculoskeletal: Normal range of motion. She exhibits no edema.  Lymphadenopathy:    She has no cervical adenopathy.  Neurological: She is alert and oriented to person, place, and time.  Skin: Skin is warm and dry. No rash noted.  Psychiatric: She has a normal mood and affect.    ED Course  Procedures (including critical care time) Labs Review Labs Reviewed  LIPASE, BLOOD  COMPREHENSIVE METABOLIC PANEL  CBC  URINALYSIS, ROUTINE W REFLEX MICROSCOPIC (NOT AT [facility])    Imaging Review No results found. I have personally reviewed and evaluated these images and lab results as part of my medical decision-making.   EKG Interpretation   Date/Time:  Saturday December 10 2015 14:18:56 EST Ventricular Rate:  82 PR Interval:  128 QRS Duration: 80 QT Interval:  392 QTC Calculation: 457 R Axis:   14 Text Interpretation:  Sinus rhythm with Premature supraventricular  complexes Possible Left atrial enlargement Borderline ECG since last  tracing no significant change Confirmed by Aira Sallade  MD, Macallister Ashmead ([id number]) on  12/10/2015 2:34:13 PM      MDM   Final diagnoses:  None    Labs, CT, pain meds ordered.  Care turned over to oncoming physician, Dr. Tyrone Nine.    Malvin Johns, MD 12/10/15 [phone]

## 2015-12-11 LAB — COMPREHENSIVE METABOLIC PANEL
ALBUMIN: 3.1 g/dL — AB (ref 3.5–5.0)
ALK PHOS: 77 U/L (ref 38–126)
ALT: 18 U/L (ref 14–54)
AST: 16 U/L (ref 15–41)
Anion gap: 8 (ref 5–15)
BILIRUBIN TOTAL: 0.3 mg/dL (ref 0.3–1.2)
BUN: 11 mg/dL (ref 6–20)
CO2: 21 mmol/L — AB (ref 22–32)
CREATININE: 0.67 mg/dL (ref 0.44–1.00)
Calcium: 8.7 mg/dL — ABNORMAL LOW (ref 8.9–10.3)
Chloride: 112 mmol/L — ABNORMAL HIGH (ref 101–111)
GFR calc Af Amer: >60 mL/min (ref >60)
GFR calc non Af Amer: >60 mL/min (ref >60)
GLUCOSE: 178 mg/dL — AB (ref 65–99)
Potassium: 3.9 mmol/L (ref 3.5–5.1)
Sodium: 141 mmol/L (ref 135–145)
Total Protein: 6.3 g/dL — ABNORMAL LOW (ref 6.5–8.1)

## 2015-12-11 LAB — CBC
HEMATOCRIT: 38.3 % (ref 36.0–46.0)
HEMOGLOBIN: 12.4 g/dL (ref 12.0–15.0)
MCH: 28.1 pg (ref 26.0–34.0)
MCHC: 32.4 g/dL (ref 30.0–36.0)
MCV: 86.8 fL (ref 78.0–100.0)
Platelets: 236 10*3/uL (ref 150–400)
RBC: 4.41 MIL/uL (ref 3.87–5.11)
RDW: 15.1 % (ref 11.5–15.5)
WBC: 13.8 10*3/uL — AB (ref 4.0–10.5)

## 2015-12-11 MED ORDER — ONDANSETRON 4 MG PO TBDP: 8.0000 mg | ORAL_TABLET | Freq: Four times a day (QID) | ORAL | Status: DC | PRN

## 2015-12-11 MED ORDER — TRAMADOL HCL 50 MG PO TABS: 50.0000 mg | ORAL_TABLET | Freq: Four times a day (QID) | ORAL | Status: DC | PRN

## 2015-12-11 MED ORDER — TRAMADOL HCL 50 MG PO TABS
50.0000 mg | ORAL_TABLET | Freq: Four times a day (QID) | ORAL | Status: DC | PRN
  Administered 2015-12-11: 50 mg via ORAL
  Filled 2015-12-11: qty 1

## 2015-12-11 NOTE — Discharge Instructions (Signed)
[facility] ______[facility] °LAPAROSCOPIC SURGERY: POST OP INSTRUCTIONS °Always review your discharge instruction sheet given to you by the facility where your surgery was performed. °IF YOU HAVE DISABILITY OR FAMILY LEAVE FORMS, YOU MUST BRING THEM TO THE OFFICE FOR PROCESSING.   °DO NOT GIVE THEM TO YOUR DOCTOR. ° °1. A prescription for pain medication may be given to you upon discharge.  Take your pain medication as prescribed, if needed.  If narcotic pain medicine is not needed, then you may take acetaminophen (Tylenol) or ibuprofen (Advil) as needed. °2. Take your usually prescribed medications unless otherwise directed. °3. If you need a refill on your pain medication, please contact your pharmacy.  They will contact our office to request authorization. Prescriptions will not be filled after 5pm or on week-ends. °4. You should follow a light diet the first few days after arrival home, such as soup and crackers, etc.  Be sure to include lots of fluids daily. °5. Most patients will experience some swelling and bruising in the area of the incisions.  Ice packs will help.  Swelling and bruising can take several days to resolve.  °6. It is common to experience some constipation if taking pain medication after surgery.  Increasing fluid intake and taking a stool softener (such as Colace) will usually help or prevent this problem from occurring.  A mild laxative (Milk of Magnesia or Miralax) should be taken according to package instructions if there are no bowel movements after 48 hours. °7. Unless discharge instructions indicate otherwise, you may remove your bandages 24-48 hours after surgery, and you may shower at that time.  You may have steri-strips (small skin tapes) in place directly over the incision.  These strips should be left on the skin for 7-10 days.  If your surgeon used skin glue on the incision, you may shower in 24 hours.  The glue will flake off over the next 2-3 weeks.  Any sutures or  staples will be removed at the office during your follow-up visit. °8. ACTIVITIES:  You may resume regular (light) daily activities beginning the next day--such as daily self-care, walking, climbing stairs--gradually increasing activities as tolerated.  You may have sexual intercourse when it is comfortable.  Refrain from any heavy lifting or straining until approved by your doctor. °a. You may drive when you are no longer taking prescription pain medication, you can comfortably wear a seatbelt, and you can safely maneuver your car and apply brakes. °b. RETURN TO WORK:  __________________________________________________________ °9. You should see your doctor in the office for a follow-up appointment approximately 2-3 weeks after your surgery.  Make sure that you call for this appointment within a day or two after you arrive home to insure a convenient appointment time. °10. OTHER INSTRUCTIONS: __________________________________________________________________________________________________________________________ __________________________________________________________________________________________________________________________ °WHEN TO CALL YOUR DOCTOR: °1. Fever over 101.0 °2. Inability to urinate °3. Continued bleeding from incision. °4. Increased pain, redness, or drainage from the incision. °5. Increasing abdominal pain ° °The clinic staff is available to answer your questions during regular business hours.  Please don’t hesitate to call and ask to speak to one of the nurses for clinical concerns.  If you have a medical emergency, go to the nearest emergency room or call 911.  A surgeon from [facility] is always on call at the hospital. °[address], [address], [place], [place]  [address] ? [address] [address], [place], [place]   [address] °[phone] ? [phone] ? FAX [phone] °Web site:   [email or web address] °

## 2015-12-11 NOTE — Progress Notes (Signed)
Prescriptions and discharge instructions provided to patient and patient's mother.  No questions related to discharge at this time.  IV removed.  All belongings with patient.  Family friend to drive patient and patient's mother home upon discharge.  Crackers provided to aid in decreasing nausea per PA verbal order with read back.  Patient tolerated crackers and Tramadol well.  Escorted via wheelchair by NT at discharge.

## 2015-12-11 NOTE — Discharge Summary (Signed)
Patient ID: DEBBE AFABLE MRN: [id number] DOB/AGE: [birth year] 60 y.o.  Admit date: 12/10/2015 Discharge date: 12/11/2015  Procedures: lap appy  Consults: None  Reason for Admission:  Patient presents emergency room with a follow her history of right lower quadrant abdominal pain. The pain started this morning at 5:00. It progressed as the day went on became more sharp and localized to right lower quadrant. She had some diarrhea about 3 weeks ago that resolved. She's not been vomiting. The pain is sharp in nature location right quadrant made worse with movement. CT scan shows acute appendicitis without perforation.  Admission Diagnoses:  1. Acute appendicitis  Hospital Course: The patient was admitted and taken to the OR where she underwent a lap appy.  She tolerated this procedure well.  On POD 1, she was taking some liquids and crackers.  She was having a little nausea, but wanted to go home.  She stated that zofran controls her nausea and she would like to go home with this.  She did show that she could take enough liquids to maintain her hydration at home.  She is also passing flatus with a benign post op abdominal exam.  Her pain was well controlled and she was otherwise felt stable for dc home.  PE: Abd: soft, appropriately tender, +BS, ND, obese, incisions c/d/i  Discharge Diagnoses:  Active Problems:   Acute appendicitis s/p lap appy  Discharge Medications:   Medication List    TAKE these medications        cetirizine 10 MG tablet  Commonly known as:  ZYRTEC  Take 10 mg by mouth daily as needed for allergies.     ondansetron 4 MG disintegrating tablet  Commonly known as:  ZOFRAN-ODT  Take 2 tablets (8 mg total) by mouth every 6 (six) hours as needed for nausea.     phentermine 30 MG capsule  Take 1 capsule (30 mg total) by mouth every morning.     topiramate 50 MG tablet  Commonly known as:  TOPAMAX  Take 1 tablet (50 mg total) by mouth 2 (two) times daily.     traMADol 50 MG tablet  Commonly known as:  ULTRAM  Take 1-2 tablets (50-100 mg total) by mouth every 6 (six) hours as needed for moderate pain.     venlafaxine XR 37.5 MG 24 hr capsule  Commonly known as:  EFFEXOR-XR  Take 1 capsule (37.5 mg total) by mouth daily with breakfast.     vitamin B-12 1000 MCG tablet  Commonly known as:  CYANOCOBALAMIN  Take 1,000 mcg by mouth daily.     Vitamin D-3 1000 units Caps  Take 1,000 Units by mouth daily.        Discharge Instructions:     Follow-up Information    Follow up with LIEPINS, ANDY, PA-C On 12/28/2015.   Specialty:  Surgery   Why:  [facility], 11:15am, arrive no later than 10:45am for paperwork and check in   Contact information:   [address] [place] [address] [phone]       Signed: Henreitta Cea 12/11/2015, 10:26 AM

## 2015-12-12 ENCOUNTER — Encounter (HOSPITAL_COMMUNITY): Payer: Self-pay | Admitting: Surgery

## 2015-12-12 NOTE — Anesthesia Postprocedure Evaluation (Signed)
Anesthesia Post Note  Patient: Theresa Gibson  Procedure(s) Performed: Procedure(s) (LRB): APPENDECTOMY LAPAROSCOPIC (N/A)  Patient location during evaluation: PACU Anesthesia Type: General Level of consciousness: awake Pain management: pain level controlled Vital Signs Assessment: post-procedure vital signs reviewed and stable Respiratory status: spontaneous breathing Cardiovascular status: stable Anesthetic complications: no    Last Vitals:  Filed Vitals:   12/11/15 0013 12/11/15 0512  BP: 138/73 126/64  Pulse: 73 68  Temp: 36.9 C 36.7 C  Resp: 18 18    Last Pain:  Filed Vitals:   12/11/15 1112  PainSc: 3                  EDWARDS,Korayma Hagwood

## 2016-01-10 ENCOUNTER — Ambulatory Visit: Payer: Self-pay | Admitting: Internal Medicine

## 2016-01-11 ENCOUNTER — Ambulatory Visit (INDEPENDENT_AMBULATORY_CARE_PROVIDER_SITE_OTHER): Payer: BLUE CROSS/BLUE SHIELD | Admitting: Family

## 2016-01-11 ENCOUNTER — Encounter: Payer: Self-pay | Admitting: Family

## 2016-01-11 VITALS — BP 122/80 | HR 70 | Temp 98.1°F | Resp 16 | Ht 64.0 in | Wt 234.0 lb

## 2016-01-11 DIAGNOSIS — M25512 Pain in left shoulder: Secondary | ICD-10-CM

## 2016-01-11 MED ORDER — DICLOFENAC SODIUM 2 % TD SOLN: 1.0000 "application " | Freq: Two times a day (BID) | TRANSDERMAL | Status: DC | PRN

## 2016-01-11 MED ORDER — IBUPROFEN-FAMOTIDINE 800-26.6 MG PO TABS: 1.0000 | ORAL_TABLET | Freq: Three times a day (TID) | ORAL | Status: DC | PRN

## 2016-01-11 NOTE — Progress Notes (Signed)
Subjective:    Patient ID: Theresa Gibson, female    DOB: 22-Jun-1956, 60 y.o.   MRN: [id number]  Chief Complaint  Patient presents with  . Arm Pain    Left arm pain in shoulder that sometimes moves down her arm and sometimes stays in the shoulder, has been going on for a while, would like an xray    HPI:  Theresa Gibson is a 60 y.o. female who  has a past medical history of Hypertension; Arthritis; Depression; GERD (gastroesophageal reflux disease); PONV (postoperative nausea and vomiting); Meningioma ([place]); and Allergy. and presents today for an acute office visit.  Associated symptom of pain located in her left shoulder and upper arm has been going on for about 3 months and has been progressively worsening. Pain is described as sometimes sharp and sometimes dull.  Timing of symptoms is worse at night. . Modifying factors include ibuprofen. Functional status is limited in over head activity. Denies trauma. There is occasional radiculopathy down her left hand.   Allergies  Allergen Reactions  . Ace Inhibitors Cough    REACTION: Dry cough-lisinopril      Current Outpatient Prescriptions on File Prior to Visit  Medication Sig Dispense Refill  . cetirizine (ZYRTEC) 10 MG tablet Take 10 mg by mouth daily as needed for allergies.     . Cholecalciferol (VITAMIN D-3) 1000 units CAPS Take 1,000 Units by mouth daily.    . ondansetron (ZOFRAN-ODT) 4 MG disintegrating tablet Take 2 tablets (8 mg total) by mouth every 6 (six) hours as needed for nausea. 30 tablet 0  . phentermine 30 MG capsule Take 1 capsule (30 mg total) by mouth every morning. 30 capsule 0  . topiramate (TOPAMAX) 50 MG tablet Take 1 tablet (50 mg total) by mouth 2 (two) times daily. 180 tablet 0  . traMADol (ULTRAM) 50 MG tablet Take 1-2 tablets (50-100 mg total) by mouth every 6 (six) hours as needed for moderate pain. 40 tablet 0  . venlafaxine XR (EFFEXOR-XR) 37.5 MG 24 hr capsule Take 1 capsule (37.5 mg total) by mouth  daily with breakfast. 30 capsule 1  . vitamin B-12 (CYANOCOBALAMIN) 1000 MCG tablet Take 1,000 mcg by mouth daily.     No current facility-administered medications on file prior to visit.    Review of Systems  Constitutional: Negative for fever and chills.  Musculoskeletal:       Positive for shoulder pain.   Neurological: Positive for numbness. Negative for weakness.      Objective:    BP 122/80 mmHg  Pulse 70  Temp(Src) 98.1 F (36.7 C) (Oral)  Resp 16  Ht 5\' 4"  (1.626 m)  Wt 234 lb (106.142 kg)  BMI 40.15 kg/m2  SpO2 98% Nursing note and vital signs reviewed.  Physical Exam  Constitutional: She is oriented to person, place, and time. She appears well-developed and well-nourished. No distress.  Cardiovascular: Normal rate, regular rhythm, normal heart sounds and intact distal pulses.   Pulmonary/Chest: Effort normal and breath sounds normal.  Musculoskeletal:  Left shoulder - no obvious deformity, discoloration, or edema. Tenderness elicited over subacromial space and supraspinatus tendon. Range of motion is restricted and abduction to 90. Able to elevated above 90 with compensation. Strength is 4+ in all directions. Distal pulses, sensation, and reflexes are intact and appropriate. Michel Bickers and Neer's impingement tests are positive. Negative apprehension.  Neurological: She is alert and oriented to person, place, and time.  Skin: Skin is warm and dry.  Psychiatric: She has a normal mood and affect. Her behavior is normal. Judgment and thought content normal.       Assessment & Plan:   Problem List Items Addressed This Visit      Other   Pain in joint, shoulder region - Primary    Continues to experience left shoulder pain with symptoms and exam consistent with impingement and concern for possible rotator cuff pathology. Continue with ice and home exercise therapy. Start ibuprofen-famotidine and diclofenac gel. Follow-up if symptoms worsen or fail to improve  with consideration for ultrasound and possible corticosteroid injection.      Relevant Medications   Diclofenac Sodium 2 % SOLN   Ibuprofen-Famotidine (DUEXIS) 800-26.6 MG TABS

## 2016-01-11 NOTE — Assessment & Plan Note (Signed)
Continues to experience left shoulder pain with symptoms and exam consistent with impingement and concern for possible rotator cuff pathology. Continue with ice and home exercise therapy. Start ibuprofen-famotidine and diclofenac gel. Follow-up if symptoms worsen or fail to improve with consideration for ultrasound and possible corticosteroid injection.

## 2016-01-11 NOTE — Progress Notes (Signed)
Pre visit review using our clinic review tool, if applicable. No additional management support is needed unless otherwise documented below in the visit note. 

## 2016-01-11 NOTE — Patient Instructions (Signed)
Thank you for choosing [facility].  Summary/Instructions:  Your prescription(s) have been submitted to your pharmacy or been printed and provided for you. Please take as directed and contact our office if you believe you are having problem(s) with the medication(s) or have any questions.  If your symptoms worsen or fail to improve, please contact our office for further instruction, or in case of emergency go directly to the emergency room at the closest medical facility.   Please ice 2-3 times per day for about 20 minutes and as needed.  Home exercises daily.  Pennsaid twice daily - pinkie size on your shoulder. Tumeric 500 mg daily.  Impingement Syndrome, Rotator Cuff, Bursitis With Rehab Impingement syndrome is a condition that involves inflammation of the tendons of the rotator cuff and the subacromial bursa, that causes pain in the shoulder. The rotator cuff consists of four tendons and muscles that control much of the shoulder and upper arm function. The subacromial bursa is a fluid filled sac that helps reduce friction between the rotator cuff and one of the bones of the shoulder (acromion). Impingement syndrome is usually an overuse injury that causes swelling of the bursa (bursitis), swelling of the tendon (tendonitis), and/or a tear of the tendon (strain). Strains are classified into three categories. Grade 1 strains cause pain, but the tendon is not lengthened. Grade 2 strains include a lengthened ligament, due to the ligament being stretched or partially ruptured. With grade 2 strains there is still function, although the function may be decreased. Grade 3 strains include a complete tear of the tendon or muscle, and function is usually impaired. SYMPTOMS   Pain around the shoulder, often at the outer portion of the upper arm.  Pain that gets worse with shoulder function, especially when reaching overhead or lifting.  Sometimes, aching when not using the arm.  Pain that wakes  you up at night.  Sometimes, tenderness, swelling, warmth, or redness over the affected area.  Loss of strength.  Limited motion of the shoulder, especially reaching behind the back (to the back pocket or to unhook bra) or across your body.  Crackling sound (crepitation) when moving the arm.  Biceps tendon pain and inflammation (in the front of the shoulder). Worse when bending the elbow or lifting. CAUSES  Impingement syndrome is often an overuse injury, in which chronic (repetitive) motions cause the tendons or bursa to become inflamed. A strain occurs when a force is paced on the tendon or muscle that is greater than it can withstand. Common mechanisms of injury include: Stress from sudden increase in duration, frequency, or intensity of training.  Direct hit (trauma) to the shoulder.  Aging, erosion of the tendon with normal use.  Bony bump on shoulder (acromial spur). RISK INCREASES WITH:  Contact sports (football, wrestling, boxing).  Throwing sports (baseball, tennis, volleyball).  Weightlifting and bodybuilding.  Heavy labor.  Previous injury to the rotator cuff, including impingement.  Poor shoulder strength and flexibility.  Failure to warm up properly before activity.  Inadequate protective equipment.  Old age.  Bony bump on shoulder (acromial spur). PREVENTION   Warm up and stretch properly before activity.  Allow for adequate recovery between workouts.  Maintain physical fitness:  Strength, flexibility, and endurance.  Cardiovascular fitness.  Learn and use proper exercise technique. PROGNOSIS  If treated properly, impingement syndrome usually goes away within 6 weeks. Sometimes surgery is required.  RELATED COMPLICATIONS   Longer healing time if not properly treated, or if not given enough  time to heal.  Recurring symptoms, that result in a chronic condition.  Shoulder stiffness, frozen shoulder, or loss of motion.  Rotator cuff tendon  tear.  Recurring symptoms, especially if activity is resumed too soon, with overuse, with a direct blow, or when using poor technique. TREATMENT  Treatment first involves the use of ice and medicine, to reduce pain and inflammation. The use of strengthening and stretching exercises may help reduce pain with activity. These exercises may be performed at home or with a therapist. If non-surgical treatment is unsuccessful after more than 6 months, surgery may be advised. After surgery and rehabilitation, activity is usually possible in 3 months.  MEDICATION  If pain medicine is needed, nonsteroidal anti-inflammatory medicines (aspirin and ibuprofen), or other minor pain relievers (acetaminophen), are often advised.  Do not take pain medicine for 7 days before surgery.  Prescription pain relievers may be given, if your caregiver thinks they are needed. Use only as directed and only as much as you need.  Corticosteroid injections may be given by your caregiver. These injections should be reserved for the most serious cases, because they may only be given a certain number of times. HEAT AND COLD  Cold treatment (icing) should be applied for 10 to 15 minutes every 2 to 3 hours for inflammation and pain, and immediately after activity that aggravates your symptoms. Use ice packs or an ice massage.  Heat treatment may be used before performing stretching and strengthening activities prescribed by your caregiver, physical therapist, or athletic trainer. Use a heat pack or a warm water soak. SEEK MEDICAL CARE IF:   Symptoms get worse or do not improve in 4 to 6 weeks, despite treatment.  New, unexplained symptoms develop. (Drugs used in treatment may produce side effects.) EXERCISES  RANGE OF MOTION (ROM) AND STRETCHING EXERCISES - Impingement Syndrome (Rotator Cuff  Tendinitis, Bursitis) These exercises may help you when beginning to rehabilitate your injury. Your symptoms may go away with or  without further involvement from your physician, physical therapist or athletic trainer. While completing these exercises, remember:   Restoring tissue flexibility helps normal motion to return to the joints. This allows healthier, less painful movement and activity.  An effective stretch should be held for at least 30 seconds.  A stretch should never be painful. You should only feel a gentle lengthening or release in the stretched tissue. STRETCH - Flexion, Standing  Stand with good posture. With an underhand grip on your right / left hand, and an overhand grip on the opposite hand, grasp a broomstick or cane so that your hands are a little more than shoulder width apart.  Keeping your right / left elbow straight and shoulder muscles relaxed, push the stick with your opposite hand, to raise your right / left arm in front of your body and then overhead. Raise your arm until you feel a stretch in your right / left shoulder, but before you have increased shoulder pain.  Try to avoid shrugging your right / left shoulder as your arm rises, by keeping your shoulder blade tucked down and toward your mid-back spine. Hold for __________ seconds.  Slowly return to the starting position. Repeat __________ times. Complete this exercise __________ times per day. STRETCH - Abduction, Supine  Lie on your back. With an underhand grip on your right / left hand and an overhand grip on the opposite hand, grasp a broomstick or cane so that your hands are a little more than shoulder width apart.  Keeping your right / left elbow straight and your shoulder muscles relaxed, push the stick with your opposite hand, to raise your right / left arm out to the side of your body and then overhead. Raise your arm until you feel a stretch in your right / left shoulder, but before you have increased shoulder pain.  Try to avoid shrugging your right / left shoulder as your arm rises, by keeping your shoulder blade tucked down  and toward your mid-back spine. Hold for __________ seconds.  Slowly return to the starting position. Repeat __________ times. Complete this exercise __________ times per day. ROM - Flexion, Active-Assisted  Lie on your back. You may bend your knees for comfort.  Grasp a broomstick or cane so your hands are about shoulder width apart. Your right / left hand should grip the end of the stick, so that your hand is positioned "thumbs-up," as if you were about to shake hands.  Using your healthy arm to lead, raise your right / left arm overhead, until you feel a gentle stretch in your shoulder. Hold for __________ seconds.  Use the stick to assist in returning your right / left arm to its starting position. Repeat __________ times. Complete this exercise __________ times per day.  ROM - Internal Rotation, Supine   Lie on your back on a firm surface. Place your right / left elbow about 60 degrees away from your side. Elevate your elbow with a folded towel, so that the elbow and shoulder are the same height.  Using a broomstick or cane and your strong arm, pull your right / left hand toward your body until you feel a gentle stretch, but no increase in your shoulder pain. Keep your shoulder and elbow in place throughout the exercise.  Hold for __________ seconds. Slowly return to the starting position. Repeat __________ times. Complete this exercise __________ times per day. STRETCH - Internal Rotation  Place your right / left hand behind your back, palm up.  Throw a towel or belt over your opposite shoulder. Grasp the towel with your right / left hand.  While keeping an upright posture, gently pull up on the towel, until you feel a stretch in the front of your right / left shoulder.  Avoid shrugging your right / left shoulder as your arm rises, by keeping your shoulder blade tucked down and toward your mid-back spine.  Hold for __________ seconds. Release the stretch, by lowering your  healthy hand. Repeat __________ times. Complete this exercise __________ times per day. ROM - Internal Rotation   Using an underhand grip, grasp a stick behind your back with both hands.  While standing upright with good posture, slide the stick up your back until you feel a mild stretch in the front of your shoulder.  Hold for __________ seconds. Slowly return to your starting position. Repeat __________ times. Complete this exercise __________ times per day.  STRETCH - Posterior Shoulder Capsule   Stand or sit with good posture. Grasp your right / left elbow and draw it across your chest, keeping it at the same height as your shoulder.  Pull your elbow, so your upper arm comes in closer to your chest. Pull until you feel a gentle stretch in the back of your shoulder.  Hold for __________ seconds. Repeat __________ times. Complete this exercise __________ times per day. STRENGTHENING EXERCISES - Impingement Syndrome (Rotator Cuff Tendinitis, Bursitis) These exercises may help you when beginning to rehabilitate your injury. They may resolve your  symptoms with or without further involvement from your physician, physical therapist or athletic trainer. While completing these exercises, remember:  Muscles can gain both the endurance and the strength needed for everyday activities through controlled exercises.  Complete these exercises as instructed by your physician, physical therapist or athletic trainer. Increase the resistance and repetitions only as guided.  You may experience muscle soreness or fatigue, but the pain or discomfort you are trying to eliminate should never worsen during these exercises. If this pain does get worse, stop and make sure you are following the directions exactly. If the pain is still present after adjustments, discontinue the exercise until you can discuss the trouble with your clinician.  During your recovery, avoid activity or exercises which involve actions  that place your injured hand or elbow above your head or behind your back or head. These positions stress the tissues which you are trying to heal. STRENGTH - Scapular Depression and Adduction   With good posture, sit on a firm chair. Support your arms in front of you, with pillows, arm rests, or on a table top. Have your elbows in line with the sides of your body.  Gently draw your shoulder blades down and toward your mid-back spine. Gradually increase the tension, without tensing the muscles along the top of your shoulders and the back of your neck.  Hold for __________ seconds. Slowly release the tension and relax your muscles completely before starting the next repetition.  After you have practiced this exercise, remove the arm support and complete the exercise in standing as well as sitting position. Repeat __________ times. Complete this exercise __________ times per day.  STRENGTH - Shoulder Abductors, Isometric  With good posture, stand or sit about 4-6 inches from a wall, with your right / left side facing the wall.  Bend your right / left elbow. Gently press your right / left elbow into the wall. Increase the pressure gradually, until you are pressing as hard as you can, without shrugging your shoulder or increasing any shoulder discomfort.  Hold for __________ seconds.  Release the tension slowly. Relax your shoulder muscles completely before you begin the next repetition. Repeat __________ times. Complete this exercise __________ times per day.  STRENGTH - External Rotators, Isometric  Keep your right / left elbow at your side and bend it 90 degrees.  Step into a door frame so that the outside of your right / left wrist can press against the door frame without your upper arm leaving your side.  Gently press your right / left wrist into the door frame, as if you were trying to swing the back of your hand away from your stomach. Gradually increase the tension, until you are  pressing as hard as you can, without shrugging your shoulder or increasing any shoulder discomfort.  Hold for __________ seconds.  Release the tension slowly. Relax your shoulder muscles completely before you begin the next repetition. Repeat __________ times. Complete this exercise __________ times per day.  STRENGTH - Supraspinatus   Stand or sit with good posture. Grasp a __________ weight, or an exercise band or tubing, so that your hand is "thumbs-up," like you are shaking hands.  Slowly lift your right / left arm in a "V" away from your thigh, diagonally into the space between your side and straight ahead. Lift your hand to shoulder height or as far as you can, without increasing any shoulder pain. At first, many people do not lift their hands above shoulder height.  Avoid shrugging your right / left shoulder as your arm rises, by keeping your shoulder blade tucked down and toward your mid-back spine.  Hold for __________ seconds. Control the descent of your hand, as you slowly return to your starting position. Repeat __________ times. Complete this exercise __________ times per day.  STRENGTH - External Rotators  Secure a rubber exercise band or tubing to a fixed object (table, pole) so that it is at the same height as your right / left elbow when you are standing or sitting on a firm surface.  Stand or sit so that the secured exercise band is at your uninjured side.  Bend your right / left elbow 90 degrees. Place a folded towel or small pillow under your right / left arm, so that your elbow is a few inches away from your side.  Keeping the tension on the exercise band, pull it away from your body, as if pivoting on your elbow. Be sure to keep your body steady, so that the movement is coming only from your rotating shoulder.  Hold for __________ seconds. Release the tension in a controlled manner, as you return to the starting position. Repeat __________ times. Complete this  exercise __________ times per day.  STRENGTH - Internal Rotators   Secure a rubber exercise band or tubing to a fixed object (table, pole) so that it is at the same height as your right / left elbow when you are standing or sitting on a firm surface.  Stand or sit so that the secured exercise band is at your right / left side.  Bend your elbow 90 degrees. Place a folded towel or small pillow under your right / left arm so that your elbow is a few inches away from your side.  Keeping the tension on the exercise band, pull it across your body, toward your stomach. Be sure to keep your body steady, so that the movement is coming only from your rotating shoulder.  Hold for __________ seconds. Release the tension in a controlled manner, as you return to the starting position. Repeat __________ times. Complete this exercise __________ times per day.  STRENGTH - Scapular Protractors, Standing   Stand arms length away from a wall. Place your hands on the wall, keeping your elbows straight.  Begin by dropping your shoulder blades down and toward your mid-back spine.  To strengthen your protractors, keep your shoulder blades down, but slide them forward on your rib cage. It will feel as if you are lifting the back of your rib cage away from the wall. This is a subtle motion and can be challenging to complete. Ask your caregiver for further instruction, if you are not sure you are doing the exercise correctly.  Hold for __________ seconds. Slowly return to the starting position, resting the muscles completely before starting the next repetition. Repeat __________ times. Complete this exercise __________ times per day. STRENGTH - Scapular Protractors, Supine  Lie on your back on a firm surface. Extend your right / left arm straight into the air while holding a __________ weight in your hand.  Keeping your head and back in place, lift your shoulder off the floor.  Hold for __________ seconds. Slowly  return to the starting position, and allow your muscles to relax completely before starting the next repetition. Repeat __________ times. Complete this exercise __________ times per day. STRENGTH - Scapular Protractors, Quadruped  Get onto your hands and knees, with your shoulders directly over your hands (or as  close as you can be, comfortably).  Keeping your elbows locked, lift the back of your rib cage up into your shoulder blades, so your mid-back rounds out. Keep your neck muscles relaxed.  Hold this position for __________ seconds. Slowly return to the starting position and allow your muscles to relax completely before starting the next repetition. Repeat __________ times. Complete this exercise __________ times per day.  STRENGTH - Scapular Retractors  Secure a rubber exercise band or tubing to a fixed object (table, pole), so that it is at the height of your shoulders when you are either standing, or sitting on a firm armless chair.  With a palm down grip, grasp an end of the band in each hand. Straighten your elbows and lift your hands straight in front of you, at shoulder height. Step back, away from the secured end of the band, until it becomes tense.  Squeezing your shoulder blades together, draw your elbows back toward your sides, as you bend them. Keep your upper arms lifted away from your body throughout the exercise.  Hold for __________ seconds. Slowly ease the tension on the band, as you reverse the directions and return to the starting position. Repeat __________ times. Complete this exercise __________ times per day. STRENGTH - Shoulder Extensors   Secure a rubber exercise band or tubing to a fixed object (table, pole) so that it is at the height of your shoulders when you are either standing, or sitting on a firm armless chair.  With a thumbs-up grip, grasp an end of the band in each hand. Straighten your elbows and lift your hands straight in front of you, at shoulder  height. Step back, away from the secured end of the band, until it becomes tense.  Squeezing your shoulder blades together, pull your hands down to the sides of your thighs. Do not allow your hands to go behind you.  Hold for __________ seconds. Slowly ease the tension on the band, as you reverse the directions and return to the starting position. Repeat __________ times. Complete this exercise __________ times per day.  STRENGTH - Scapular Retractors and External Rotators   Secure a rubber exercise band or tubing to a fixed object (table, pole) so that it is at the height as your shoulders, when you are either standing, or sitting on a firm armless chair.  With a palm down grip, grasp an end of the band in each hand. Bend your elbows 90 degrees and lift your elbows to shoulder height, at your sides. Step back, away from the secured end of the band, until it becomes tense.  Squeezing your shoulder blades together, rotate your shoulders so that your upper arms and elbows remain stationary, but your fists travel upward to head height.  Hold for __________ seconds. Slowly ease the tension on the band, as you reverse the directions and return to the starting position. Repeat __________ times. Complete this exercise __________ times per day.  STRENGTH - Scapular Retractors and External Rotators, Rowing   Secure a rubber exercise band or tubing to a fixed object (table, pole) so that it is at the height of your shoulders, when you are either standing, or sitting on a firm armless chair.  With a palm down grip, grasp an end of the band in each hand. Straighten your elbows and lift your hands straight in front of you, at shoulder height. Step back, away from the secured end of the band, until it becomes tense.  Step 1: Squeeze  your shoulder blades together. Bending your elbows, draw your hands to your chest, as if you are rowing a boat. At the end of this motion, your hands and elbow should be at  shoulder height and your elbows should be out to your sides.  Step 2: Rotate your shoulders, to raise your hands above your head. Your forearms should be vertical and your upper arms should be horizontal.  Hold for __________ seconds. Slowly ease the tension on the band, as you reverse the directions and return to the starting position. Repeat __________ times. Complete this exercise __________ times per day.  STRENGTH - Scapular Depressors  Find a sturdy chair without wheels, such as a dining room chair.  Keeping your feet on the floor, and your hands on the chair arms, lift your bottom up from the seat, and lock your elbows.  Keeping your elbows straight, allow gravity to pull your body weight down. Your shoulders will rise toward your ears.  Raise your body against gravity by drawing your shoulder blades down your back, shortening the distance between your shoulders and ears. Although your feet should always maintain contact with the floor, your feet should progressively support less body weight, as you get stronger.  Hold for __________ seconds. In a controlled and slow manner, lower your body weight to begin the next repetition. Repeat __________ times. Complete this exercise __________ times per day.    This information is not intended to replace advice given to you by your health care provider. Make sure you discuss any questions you have with your health care provider.   Document Released: 11/19/2005 Document Revised: 12/10/2014 Document Reviewed: 03/03/2009 Elsevier Interactive Patient Education [employer].

## 2016-01-17 ENCOUNTER — Other Ambulatory Visit: Payer: Self-pay | Admitting: Family

## 2016-03-12 ENCOUNTER — Telehealth: Payer: Self-pay | Admitting: Family

## 2016-03-12 DIAGNOSIS — M25512 Pain in left shoulder: Secondary | ICD-10-CM

## 2016-03-12 NOTE — Telephone Encounter (Signed)
Pt called would like for greg to call in the med that he called in for her before .Marland Kitchen She did not know the name of it.  But it was ibuprofen cover with Pepcid

## 2016-03-13 MED ORDER — IBUPROFEN-FAMOTIDINE 800-26.6 MG PO TABS: 1.0000 | ORAL_TABLET | Freq: Three times a day (TID) | ORAL | Status: DC | PRN

## 2016-03-13 NOTE — Telephone Encounter (Signed)
Pt aware.

## 2016-03-13 NOTE — Telephone Encounter (Signed)
Medication sent.

## 2016-03-13 NOTE — Telephone Encounter (Signed)
Please advise 

## 2016-07-02 ENCOUNTER — Encounter: Payer: Self-pay | Admitting: Family

## 2016-07-02 ENCOUNTER — Ambulatory Visit (INDEPENDENT_AMBULATORY_CARE_PROVIDER_SITE_OTHER): Payer: BLUE CROSS/BLUE SHIELD | Admitting: Family

## 2016-07-02 ENCOUNTER — Other Ambulatory Visit: Payer: BLUE CROSS/BLUE SHIELD

## 2016-07-02 VITALS — BP 120/78 | HR 63 | Temp 98.4°F | Resp 16 | Ht 64.0 in | Wt 229.0 lb

## 2016-07-02 DIAGNOSIS — F17200 Nicotine dependence, unspecified, uncomplicated: Secondary | ICD-10-CM | POA: Diagnosis not present

## 2016-07-02 DIAGNOSIS — E669 Obesity, unspecified: Secondary | ICD-10-CM | POA: Diagnosis not present

## 2016-07-02 DIAGNOSIS — R8299 Other abnormal findings in urine: Secondary | ICD-10-CM

## 2016-07-02 DIAGNOSIS — R8290 Unspecified abnormal findings in urine: Secondary | ICD-10-CM | POA: Insufficient documentation

## 2016-07-02 LAB — POCT URINALYSIS DIPSTICK
BILIRUBIN UA: NEGATIVE
Glucose, UA: NEGATIVE
Ketones, UA: NEGATIVE
Leukocytes, UA: NEGATIVE
NITRITE UA: NEGATIVE
PH UA: 6
Protein, UA: NEGATIVE
RBC UA: NEGATIVE
UROBILINOGEN UA: NEGATIVE

## 2016-07-02 MED ORDER — PHENTERMINE HCL 30 MG PO CAPS: 30.0000 mg | ORAL_CAPSULE | ORAL | 0 refills | Status: DC

## 2016-07-02 MED ORDER — VARENICLINE TARTRATE 0.5 MG X 11 & 1 MG X 42 PO MISC: ORAL | 0 refills | Status: DC

## 2016-07-02 NOTE — Assessment & Plan Note (Signed)
BMI of 39. Recommend weight loss of 5-10% of current body weight. Recommend increasing physical activity to 30 minutes of moderate level activity daily. Encourage nutritional intake that focuses on nutrient dense foods and is moderate, varied, and balanced and is low in saturated fats and processed/sugary foods. Start phentermine. Continue to monitor.

## 2016-07-02 NOTE — Progress Notes (Signed)
Subjective:    Patient ID: Theresa Gibson, female    DOB: 1956-04-16, 60 y.o.   MRN: [id number]  Chief Complaint  Patient presents with  . Back Pain    has been having lower back pain, left lower back and cloudy urine, wants something to help stop smoking, wants to get back on phentermine    HPI:  Theresa Gibson is a 60 y.o. female who  has a past medical history of Allergy; Arthritis; Depression; GERD (gastroesophageal reflux disease); Hypertension; Meningioma ([place]); and PONV (postoperative nausea and vomiting). and presents today for an office visit.  1.) Obesity - Reports that she is very active and she does not sleep all day. Currently working on her nutrition. States she has difficulties getting below 220 when she diets and reports that she has had a decreased appetite recently.  Wt Readings from Last 3 Encounters:  07/02/16 229 lb (103.9 kg)  01/11/16 234 lb (106.1 kg)  12/10/15 230 lb 8 oz (104.6 kg)    2.) Tobacco use  - Currently smokes between 0.5-1 pack per day for approximately 40 years. She has previously tried Zyban and was unsuccessful. Does have some concerns about Chantix and the dream side effects. Has also tried nicotene patches which left mild skin discoloration.   3.) Abnormal urine - This is a new problem. Associated symptom of flank pain located on the left side has been going on for about 1 week. Notes that her urine does have cloudiness to it. Modifying factors include cranberry juice and AZO which has helped a little with her symptoms. No fevers, dysuria or frequency.     Allergies  Allergen Reactions  . Ace Inhibitors Cough    REACTION: Dry cough-lisinopril      Current Outpatient Prescriptions on File Prior to Visit  Medication Sig Dispense Refill  . cetirizine (ZYRTEC) 10 MG tablet Take 10 mg by mouth daily as needed for allergies.     . Cholecalciferol (VITAMIN D-3) 1000 units CAPS Take 1,000 Units by mouth daily.    Marland Kitchen topiramate (TOPAMAX) 50  MG tablet TAKE 1 TAB BY MOUTH TWICE DAILY 180 tablet 2  . vitamin B-12 (CYANOCOBALAMIN) 1000 MCG tablet Take 1,000 mcg by mouth daily.     No current facility-administered medications on file prior to visit.      Past Surgical History:  Procedure Laterality Date  . ABDOMINAL HYSTERECTOMY     Total   . KNEE SURGERY Bilateral    arthroscopy  . LAPAROSCOPIC APPENDECTOMY N/A 12/10/2015   Procedure: APPENDECTOMY LAPAROSCOPIC;  Surgeon: Erroll Luna, MD;  Location: [facility];  Service: General;  Laterality: N/A;     Past Medical History:  Diagnosis Date  . Allergy   . Arthritis   . Depression   . GERD (gastroesophageal reflux disease)   . Hypertension   . Meningioma ([place])   . PONV (postoperative nausea and vomiting)    nausea only    Review of Systems  Constitutional: Negative for chills and fever.  Genitourinary: Positive for flank pain. Negative for dysuria, frequency, hematuria and urgency.      Objective:    BP 120/78 (BP Location: Left Arm, Patient Position: Sitting, Cuff Size: Large)   Pulse 63   Temp 98.4 F (36.9 C) (Oral)   Resp 16   Ht 5\' 4"  (1.626 m)   Wt 229 lb (103.9 kg)   SpO2 98%   BMI 39.31 kg/m  Nursing note and vital signs reviewed.  Physical Exam  Constitutional: She is oriented to person, place, and time. She appears well-developed and well-nourished. No distress.  Cardiovascular: Normal rate, regular rhythm, normal heart sounds and intact distal pulses.   Pulmonary/Chest: Effort normal and breath sounds normal.  Musculoskeletal:  Low back - No obvious deformity, discoloration or edema. There is tenderness of the lumbar paraspinal musculature on the left with no crepitus or deformity. Range of motion is within normal limits. There does not appear to be CVA tenderness. Straight leg raise is negative. Fabers test is negative.   Neurological: She is alert and oriented to person, place, and time.  Skin: Skin is warm and dry.  Psychiatric: She has a normal  mood and affect. Her behavior is normal. Judgment and thought content normal.       Assessment & Plan:   Problem List Items Addressed This Visit      Other   Obesity    BMI of 39. Recommend weight loss of 5-10% of current body weight. Recommend increasing physical activity to 30 minutes of moderate level activity daily. Encourage nutritional intake that focuses on nutrient dense foods and is moderate, varied, and balanced and is low in saturated fats and processed/sugary foods. Start phentermine. Continue to monitor.       Relevant Medications   phentermine 30 MG capsule   DISORDER, TOBACCO USE    Approximately 21 pack year history with previous treatment failure with Zyban. Nicotine patches resulted in changes to skin. Discussed risks and proper usage of Chantix. Patient is wishing to pursue. Start Chantix and follow up in 1 month or sooner.       Relevant Medications   varenicline (CHANTIX STARTING MONTH PAK) 0.5 MG X 11 & 1 MG X 42 tablet   Cloudy urine - Primary    In office UA is negative for leukocytes, nitrites and hematuria. Symptoms are unlikely UA related as they generally occur with movement. Urine will be sent for culture. Continue to monitor and treat for mild back pain with urine culture pending.       Relevant Orders   POCT urinalysis dipstick (Completed)   Urine culture    Other Visit Diagnoses   None.      I have discontinued Theresa Gibson's venlafaxine XR, ondansetron, traMADol, Diclofenac Sodium, Ibuprofen-Famotidine, and Ibuprofen-Famotidine. I am also having her maintain her cetirizine, Vitamin D-3, vitamin B-12, topiramate, varenicline, and phentermine.   Meds ordered this encounter  Medications  . DISCONTD: varenicline (CHANTIX STARTING MONTH PAK) 0.5 MG X 11 & 1 MG X 42 tablet    Sig: Take one 0.5 mg tablet by mouth once daily for 3 days, then increase to one 0.5 mg tablet twice daily for 4 days, then increase to one 1 mg tablet twice daily.    Dispense:   53 tablet    Refill:  0    Order Specific Question:   Supervising Provider    Answer:   Pricilla Holm A [id number]  . DISCONTD: phentermine 30 MG capsule    Sig: Take 1 capsule (30 mg total) by mouth every morning.    Dispense:  30 capsule    Refill:  0    Order Specific Question:   Supervising Provider    Answer:   Pricilla Holm A [id number]  . varenicline (CHANTIX STARTING MONTH PAK) 0.5 MG X 11 & 1 MG X 42 tablet    Sig: Take one 0.5 mg tablet by mouth once daily for 3 days, then increase to one 0.5 mg tablet  twice daily for 4 days, then increase to one 1 mg tablet twice daily.    Dispense:  53 tablet    Refill:  0    Order Specific Question:   Supervising Provider    Answer:   Pricilla Holm A [id number]  . phentermine 30 MG capsule    Sig: Take 1 capsule (30 mg total) by mouth every morning.    Dispense:  30 capsule    Refill:  0    Order Specific Question:   Supervising Provider    Answer:   Pricilla Holm A [id number]     Follow-up: Return if symptoms worsen or fail to improve.  Mauricio Po, FNP

## 2016-07-02 NOTE — Assessment & Plan Note (Signed)
Approximately 21 pack year history with previous treatment failure with Zyban. Nicotine patches resulted in changes to skin. Discussed risks and proper usage of Chantix. Patient is wishing to pursue. Start Chantix and follow up in 1 month or sooner.

## 2016-07-02 NOTE — Assessment & Plan Note (Signed)
In office UA is negative for leukocytes, nitrites and hematuria. Symptoms are unlikely UA related as they generally occur with movement. Urine will be sent for culture. Continue to monitor and treat for mild back pain with urine culture pending.

## 2016-07-02 NOTE — Patient Instructions (Addendum)
Thank you for choosing [facility].  Summary/Instructions:  MyFitnessPal for calorie tracking. Eat nutrient dense foods like fruits, vegetables and nuts. Avoid saturated fats and trans fats. Look for monounsaturated and polyunsaturated fats.   Ice / heat x 20 minutes as needed and after activity with exercises daily.  We will follow up pending your urine culture.   Your prescription(s) have been submitted to your pharmacy or been printed and provided for you. Please take as directed and contact our office if you believe you are having problem(s) with the medication(s) or have any questions.  If your symptoms worsen or fail to improve, please contact our office for further instruction, or in case of emergency go directly to the emergency room at the closest medical facility.    Low Back Strain With Rehab A strain is an injury in which a tendon or muscle is torn. The muscles and tendons of the lower back are vulnerable to strains. However, these muscles and tendons are very strong and require a great force to be injured. Strains are classified into three categories. Grade 1 strains cause pain, but the tendon is not lengthened. Grade 2 strains include a lengthened ligament, due to the ligament being stretched or partially ruptured. With grade 2 strains there is still function, although the function may be decreased. Grade 3 strains involve a complete tear of the tendon or muscle, and function is usually impaired. SYMPTOMS   Pain in the lower back.  Pain that affects one side more than the other.  Pain that gets worse with movement and may be felt in the hip, buttocks, or back of the thigh.  Muscle spasms of the muscles in the back.  Swelling along the muscles of the back.  Loss of strength of the back muscles.  Crackling sound (crepitation) when the muscles are touched. CAUSES  Lower back strains occur when a force is placed on the muscles or tendons that is greater than they can  handle. Common causes of injury include:  Prolonged overuse of the muscle-tendon units in the lower back, usually from incorrect posture.  A single violent injury or force applied to the back. RISK INCREASES WITH:  Sports that involve twisting forces on the spine or a lot of bending at the waist (football, rugby, weightlifting, bowling, golf, tennis, speed skating, racquetball, swimming, running, gymnastics, diving).  Poor strength and flexibility.  Failure to warm up properly before activity.  Family history of lower back pain or disk disorders.  Previous back injury or surgery (especially fusion).  Poor posture with lifting, especially heavy objects.  Prolonged sitting, especially with poor posture. PREVENTION   Learn and use proper posture when sitting or lifting (maintain proper posture when sitting, lift using the knees and legs, not at the waist).  Warm up and stretch properly before activity.  Allow for adequate recovery between workouts.  Maintain physical fitness:  Strength, flexibility, and endurance.  Cardiovascular fitness. PROGNOSIS  If treated properly, lower back strains usually heal within 6 weeks. RELATED COMPLICATIONS   Recurring symptoms, resulting in a chronic problem.  Chronic inflammation, scarring, and partial muscle-tendon tear.  Delayed healing or resolution of symptoms.  Prolonged disability. TREATMENT  Treatment first involves the use of ice and medicine, to reduce pain and inflammation. The use of strengthening and stretching exercises may help reduce pain with activity. These exercises may be performed at home or with a therapist. Severe injuries may require referral to a therapist for further evaluation and treatment, such as  ultrasound. Your caregiver may advise that you wear a back brace or corset, to help reduce pain and discomfort. Often, prolonged bed rest results in greater harm then benefit. Corticosteroid injections may be  recommended. However, these should be reserved for the most serious cases. It is important to avoid using your back when lifting objects. At night, sleep on your back on a firm mattress with a pillow placed under your knees. If non-surgical treatment is unsuccessful, surgery may be needed.  MEDICATION   If pain medicine is needed, nonsteroidal anti-inflammatory medicines (aspirin and ibuprofen), or other minor pain relievers (acetaminophen), are often advised.  Do not take pain medicine for 7 days before surgery.  Prescription pain relievers may be given, if your caregiver thinks they are needed. Use only as directed and only as much as you need.  Ointments applied to the skin may be helpful.  Corticosteroid injections may be given by your caregiver. These injections should be reserved for the most serious cases, because they may only be given a certain number of times. HEAT AND COLD  Cold treatment (icing) should be applied for 10 to 15 minutes every 2 to 3 hours for inflammation and pain, and immediately after activity that aggravates your symptoms. Use ice packs or an ice massage.  Heat treatment may be used before performing stretching and strengthening activities prescribed by your caregiver, physical therapist, or athletic trainer. Use a heat pack or a warm water soak. SEEK MEDICAL CARE IF:   Symptoms get worse or do not improve in 2 to 4 weeks, despite treatment.  You develop numbness, weakness, or loss of bowel or bladder function.  New, unexplained symptoms develop. (Drugs used in treatment may produce side effects.) EXERCISES  RANGE OF MOTION (ROM) AND STRETCHING EXERCISES - Low Back Strain Most people with lower back pain will find that their symptoms get worse with excessive bending forward (flexion) or arching at the lower back (extension). The exercises which will help resolve your symptoms will focus on the opposite motion.  Your physician, physical therapist or athletic  trainer will help you determine which exercises will be most helpful to resolve your lower back pain. Do not complete any exercises without first consulting with your caregiver. Discontinue any exercises which make your symptoms worse until you speak to your caregiver.  If you have pain, numbness or tingling which travels down into your buttocks, leg or foot, the goal of the therapy is for these symptoms to move closer to your back and eventually resolve. Sometimes, these leg symptoms will get better, but your lower back pain may worsen. This is typically an indication of progress in your rehabilitation. Be very alert to any changes in your symptoms and the activities in which you participated in the 24 hours prior to the change. Sharing this information with your caregiver will allow him/her to most efficiently treat your condition.  These exercises may help you when beginning to rehabilitate your injury. Your symptoms may resolve with or without further involvement from your physician, physical therapist or athletic trainer. While completing these exercises, remember:  Restoring tissue flexibility helps normal motion to return to the joints. This allows healthier, less painful movement and activity.  An effective stretch should be held for at least 30 seconds.  A stretch should never be painful. You should only feel a gentle lengthening or release in the stretched tissue. FLEXION RANGE OF MOTION AND STRETCHING EXERCISES: STRETCH - Flexion, Single Knee to Chest   Lie on a  firm bed or floor with both legs extended in front of you.  Keeping one leg in contact with the floor, bring your opposite knee to your chest. Hold your leg in place by either grabbing behind your thigh or at your knee.  Pull until you feel a gentle stretch in your lower back. Hold __________ seconds.  Slowly release your grasp and repeat the exercise with the opposite side. Repeat __________ times. Complete this exercise  __________ times per day.  STRETCH - Flexion, Double Knee to Chest   Lie on a firm bed or floor with both legs extended in front of you.  Keeping one leg in contact with the floor, bring your opposite knee to your chest.  Tense your stomach muscles to support your back and then lift your other knee to your chest. Hold your legs in place by either grabbing behind your thighs or at your knees.  Pull both knees toward your chest until you feel a gentle stretch in your lower back. Hold __________ seconds.  Tense your stomach muscles and slowly return one leg at a time to the floor. Repeat __________ times. Complete this exercise __________ times per day.  STRETCH - Low Trunk Rotation  Lie on a firm bed or floor. Keeping your legs in front of you, bend your knees so they are both pointed toward the ceiling and your feet are flat on the floor.  Extend your arms out to the side. This will stabilize your upper body by keeping your shoulders in contact with the floor.  Gently and slowly drop both knees together to one side until you feel a gentle stretch in your lower back. Hold for __________ seconds.  Tense your stomach muscles to support your lower back as you bring your knees back to the starting position. Repeat the exercise to the other side. Repeat __________ times. Complete this exercise __________ times per day  EXTENSION RANGE OF MOTION AND FLEXIBILITY EXERCISES: STRETCH - Extension, Prone on Elbows   Lie on your stomach on the floor, a bed will be too soft. Place your palms about shoulder width apart and at the height of your head.  Place your elbows under your shoulders. If this is too painful, stack pillows under your chest.  Allow your body to relax so that your hips drop lower and make contact more completely with the floor.  Hold this position for __________ seconds.  Slowly return to lying flat on the floor. Repeat __________ times. Complete this exercise __________ times  per day.  RANGE OF MOTION - Extension, Prone Press Ups  Lie on your stomach on the floor, a bed will be too soft. Place your palms about shoulder width apart and at the height of your head.  Keeping your back as relaxed as possible, slowly straighten your elbows while keeping your hips on the floor. You may adjust the placement of your hands to maximize your comfort. As you gain motion, your hands will come more underneath your shoulders.  Hold this position __________ seconds.  Slowly return to lying flat on the floor. Repeat __________ times. Complete this exercise __________ times per day.  RANGE OF MOTION- Quadruped, Neutral Spine   Assume a hands and knees position on a firm surface. Keep your hands under your shoulders and your knees under your hips. You may place padding under your knees for comfort.  Drop your head and point your tail bone toward the ground below you. This will round out your lower back  like an angry cat. Hold this position for __________ seconds.  Slowly lift your head and release your tail bone so that your back sags into a large arch, like an old horse.  Hold this position for __________ seconds.  Repeat this until you feel limber in your lower back.  Now, find your "sweet spot." This will be the most comfortable position somewhere between the two previous positions. This is your neutral spine. Once you have found this position, tense your stomach muscles to support your lower back.  Hold this position for __________ seconds. Repeat __________ times. Complete this exercise __________ times per day.  STRENGTHENING EXERCISES - Low Back Strain These exercises may help you when beginning to rehabilitate your injury. These exercises should be done near your "sweet spot." This is the neutral, low-back arch, somewhere between fully rounded and fully arched, that is your least painful position. When performed in this safe range of motion, these exercises can be used  for people who have either a flexion or extension based injury. These exercises may resolve your symptoms with or without further involvement from your physician, physical therapist or athletic trainer. While completing these exercises, remember:   Muscles can gain both the endurance and the strength needed for everyday activities through controlled exercises.  Complete these exercises as instructed by your physician, physical therapist or athletic trainer. Increase the resistance and repetitions only as guided.  You may experience muscle soreness or fatigue, but the pain or discomfort you are trying to eliminate should never worsen during these exercises. If this pain does worsen, stop and make certain you are following the directions exactly. If the pain is still present after adjustments, discontinue the exercise until you can discuss the trouble with your caregiver. STRENGTHENING - Deep Abdominals, Pelvic Tilt  Lie on a firm bed or floor. Keeping your legs in front of you, bend your knees so they are both pointed toward the ceiling and your feet are flat on the floor.  Tense your lower abdominal muscles to press your lower back into the floor. This motion will rotate your pelvis so that your tail bone is scooping upwards rather than pointing at your feet or into the floor.  With a gentle tension and even breathing, hold this position for __________ seconds. Repeat __________ times. Complete this exercise __________ times per day.  STRENGTHENING - Abdominals, Crunches   Lie on a firm bed or floor. Keeping your legs in front of you, bend your knees so they are both pointed toward the ceiling and your feet are flat on the floor. Cross your arms over your chest.  Slightly tip your chin down without bending your neck.  Tense your abdominals and slowly lift your trunk high enough to just clear your shoulder blades. Lifting higher can put excessive stress on the lower back and does not further  strengthen your abdominal muscles.  Control your return to the starting position. Repeat __________ times. Complete this exercise __________ times per day.  STRENGTHENING - Quadruped, Opposite UE/LE Lift   Assume a hands and knees position on a firm surface. Keep your hands under your shoulders and your knees under your hips. You may place padding under your knees for comfort.  Find your neutral spine and gently tense your abdominal muscles so that you can maintain this position. Your shoulders and hips should form a rectangle that is parallel with the floor and is not twisted.  Keeping your trunk steady, lift your right hand no higher  than your shoulder and then your left leg no higher than your hip. Make sure you are not holding your breath. Hold this position __________ seconds.  Continuing to keep your abdominal muscles tense and your back steady, slowly return to your starting position. Repeat with the opposite arm and leg. Repeat __________ times. Complete this exercise __________ times per day.  STRENGTHENING - Lower Abdominals, Double Knee Lift  Lie on a firm bed or floor. Keeping your legs in front of you, bend your knees so they are both pointed toward the ceiling and your feet are flat on the floor.  Tense your abdominal muscles to brace your lower back and slowly lift both of your knees until they come over your hips. Be certain not to hold your breath.  Hold __________ seconds. Using your abdominal muscles, return to the starting position in a slow and controlled manner. Repeat __________ times. Complete this exercise __________ times per day.  POSTURE AND BODY MECHANICS CONSIDERATIONS - Low Back Strain Keeping correct posture when sitting, standing or completing your activities will reduce the stress put on different body tissues, allowing injured tissues a chance to heal and limiting painful experiences. The following are general guidelines for improved posture. Your physician  or physical therapist will provide you with any instructions specific to your needs. While reading these guidelines, remember:  The exercises prescribed by your provider will help you have the flexibility and strength to maintain correct postures.  The correct posture provides the best environment for your joints to work. All of your joints have less wear and tear when properly supported by a spine with good posture. This means you will experience a healthier, less painful body.  Correct posture must be practiced with all of your activities, especially prolonged sitting and standing. Correct posture is as important when doing repetitive low-stress activities (typing) as it is when doing a single heavy-load activity (lifting). RESTING POSITIONS Consider which positions are most painful for you when choosing a resting position. If you have pain with flexion-based activities (sitting, bending, stooping, squatting), choose a position that allows you to rest in a less flexed posture. You would want to avoid curling into a fetal position on your side. If your pain worsens with extension-based activities (prolonged standing, working overhead), avoid resting in an extended position such as sleeping on your stomach. Most people will find more comfort when they rest with their spine in a more neutral position, neither too rounded nor too arched. Lying on a non-sagging bed on your side with a pillow between your knees, or on your back with a pillow under your knees will often provide some relief. Keep in mind, being in any one position for a prolonged period of time, no matter how correct your posture, can still lead to stiffness. PROPER SITTING POSTURE In order to minimize stress and discomfort on your spine, you must sit with correct posture. Sitting with good posture should be effortless for a healthy body. Returning to good posture is a gradual process. Many people can work toward this most comfortably by using  various supports until they have the flexibility and strength to maintain this posture on their own. When sitting with proper posture, your ears will fall over your shoulders and your shoulders will fall over your hips. You should use the back of the chair to support your upper back. Your lower back will be in a neutral position, just slightly arched. You may place a small pillow or folded towel at  the base of your lower back for support.  When working at a desk, create an environment that supports good, upright posture. Without extra support, muscles tire, which leads to excessive strain on joints and other tissues. Keep these recommendations in mind: CHAIR:  A chair should be able to slide under your desk when your back makes contact with the back of the chair. This allows you to work closely.  The chair's height should allow your eyes to be level with the upper part of your monitor and your hands to be slightly lower than your elbows. BODY POSITION  Your feet should make contact with the floor. If this is not possible, use a foot rest.  Keep your ears over your shoulders. This will reduce stress on your neck and lower back. INCORRECT SITTING POSTURES  If you are feeling tired and unable to assume a healthy sitting posture, do not slouch or slump. This puts excessive strain on your back tissues, causing more damage and pain. Healthier options include:  Using more support, like a lumbar pillow.  Switching tasks to something that requires you to be upright or walking.  Talking a brief walk.  Lying down to rest in a neutral-spine position. PROLONGED STANDING WHILE SLIGHTLY LEANING FORWARD  When completing a task that requires you to lean forward while standing in one place for a long time, place either foot up on a stationary 2-4 inch high object to help maintain the best posture. When both feet are on the ground, the lower back tends to lose its slight inward curve. If this curve flattens (or  becomes too large), then the back and your other joints will experience too much stress, tire more quickly, and can cause pain. CORRECT STANDING POSTURES Proper standing posture should be assumed with all daily activities, even if they only take a few moments, like when brushing your teeth. As in sitting, your ears should fall over your shoulders and your shoulders should fall over your hips. You should keep a slight tension in your abdominal muscles to brace your spine. Your tailbone should point down to the ground, not behind your body, resulting in an over-extended swayback posture.  INCORRECT STANDING POSTURES  Common incorrect standing postures include a forward head, locked knees and/or an excessive swayback. WALKING Walk with an upright posture. Your ears, shoulders and hips should all line-up. PROLONGED ACTIVITY IN A FLEXED POSITION When completing a task that requires you to bend forward at your waist or lean over a low surface, try to find a way to stabilize 3 out of 4 of your limbs. You can place a hand or elbow on your thigh or rest a knee on the surface you are reaching across. This will provide you more stability so that your muscles do not fatigue as quickly. By keeping your knees relaxed, or slightly bent, you will also reduce stress across your lower back. CORRECT LIFTING TECHNIQUES DO :   Assume a wide stance. This will provide you more stability and the opportunity to get as close as possible to the object which you are lifting.  Tense your abdominals to brace your spine. Bend at the knees and hips. Keeping your back locked in a neutral-spine position, lift using your leg muscles. Lift with your legs, keeping your back straight.  Test the weight of unknown objects before attempting to lift them.  Try to keep your elbows locked down at your sides in order get the best strength from your shoulders when carrying  an object.  Always ask for help when lifting heavy or awkward  objects. INCORRECT LIFTING TECHNIQUES DO NOT:   Lock your knees when lifting, even if it is a small object.  Bend and twist. Pivot at your feet or move your feet when needing to change directions.  Assume that you can safely pick up even a paper clip without proper posture.   This information is not intended to replace advice given to you by your health care provider. Make sure you discuss any questions you have with your health care provider.   Document Released: 11/19/2005 Document Revised: 12/10/2014 Document Reviewed: 03/03/2009 Elsevier Interactive Patient Education [employer].

## 2016-07-04 ENCOUNTER — Encounter: Payer: Self-pay | Admitting: Family

## 2016-07-04 ENCOUNTER — Telehealth: Payer: Self-pay | Admitting: *Deleted

## 2016-07-04 DIAGNOSIS — F17200 Nicotine dependence, unspecified, uncomplicated: Secondary | ICD-10-CM

## 2016-07-04 LAB — URINE CULTURE: ORGANISM ID, BACTERIA: NO GROWTH

## 2016-07-04 MED ORDER — VARENICLINE TARTRATE 0.5 MG X 11 & 1 MG X 42 PO MISC: ORAL | 0 refills | Status: DC

## 2016-07-04 NOTE — Telephone Encounter (Signed)
Rec'd call pt states saw [name] on last Friday & he sent rx for chantix to [facility], they will not fill med bacause it is not a maintenance med/ Wanting to rx resent to [facility]. Verified which location inform will resend to [facility] on [address]...[name]

## 2016-08-03 DIAGNOSIS — I2119 ST elevation (STEMI) myocardial infarction involving other coronary artery of inferior wall: Secondary | ICD-10-CM

## 2016-08-03 HISTORY — DX: ST elevation (STEMI) myocardial infarction involving other coronary artery of inferior wall: I21.19

## 2016-08-24 DIAGNOSIS — I10 Essential (primary) hypertension: Secondary | ICD-10-CM | POA: Diagnosis not present

## 2016-08-24 DIAGNOSIS — F17210 Nicotine dependence, cigarettes, uncomplicated: Secondary | ICD-10-CM | POA: Diagnosis not present

## 2016-08-24 DIAGNOSIS — I2119 ST elevation (STEMI) myocardial infarction involving other coronary artery of inferior wall: Secondary | ICD-10-CM | POA: Diagnosis not present

## 2016-08-24 DIAGNOSIS — I2510 Atherosclerotic heart disease of native coronary artery without angina pectoris: Secondary | ICD-10-CM | POA: Diagnosis not present

## 2016-08-24 DIAGNOSIS — Z8249 Family history of ischemic heart disease and other diseases of the circulatory system: Secondary | ICD-10-CM | POA: Diagnosis not present

## 2016-08-24 DIAGNOSIS — I517 Cardiomegaly: Secondary | ICD-10-CM | POA: Diagnosis not present

## 2016-08-24 DIAGNOSIS — Z7982 Long term (current) use of aspirin: Secondary | ICD-10-CM | POA: Diagnosis not present

## 2016-08-24 DIAGNOSIS — I119 Hypertensive heart disease without heart failure: Secondary | ICD-10-CM | POA: Diagnosis not present

## 2016-08-24 DIAGNOSIS — Z955 Presence of coronary angioplasty implant and graft: Secondary | ICD-10-CM | POA: Diagnosis not present

## 2016-08-24 DIAGNOSIS — R001 Bradycardia, unspecified: Secondary | ICD-10-CM | POA: Diagnosis not present

## 2016-08-24 DIAGNOSIS — E785 Hyperlipidemia, unspecified: Secondary | ICD-10-CM | POA: Diagnosis not present

## 2016-08-24 DIAGNOSIS — I25118 Atherosclerotic heart disease of native coronary artery with other forms of angina pectoris: Secondary | ICD-10-CM | POA: Diagnosis not present

## 2016-08-24 DIAGNOSIS — E784 Other hyperlipidemia: Secondary | ICD-10-CM | POA: Diagnosis not present

## 2016-08-24 DIAGNOSIS — I2111 ST elevation (STEMI) myocardial infarction involving right coronary artery: Secondary | ICD-10-CM | POA: Diagnosis not present

## 2016-08-24 DIAGNOSIS — R079 Chest pain, unspecified: Secondary | ICD-10-CM | POA: Diagnosis not present

## 2016-08-25 DIAGNOSIS — I25118 Atherosclerotic heart disease of native coronary artery with other forms of angina pectoris: Secondary | ICD-10-CM | POA: Diagnosis not present

## 2016-08-25 DIAGNOSIS — E784 Other hyperlipidemia: Secondary | ICD-10-CM | POA: Diagnosis not present

## 2016-08-25 DIAGNOSIS — R001 Bradycardia, unspecified: Secondary | ICD-10-CM | POA: Diagnosis not present

## 2016-08-25 DIAGNOSIS — I119 Hypertensive heart disease without heart failure: Secondary | ICD-10-CM | POA: Diagnosis not present

## 2016-08-25 DIAGNOSIS — I2111 ST elevation (STEMI) myocardial infarction involving right coronary artery: Secondary | ICD-10-CM | POA: Diagnosis not present

## 2016-08-26 DIAGNOSIS — R001 Bradycardia, unspecified: Secondary | ICD-10-CM | POA: Diagnosis not present

## 2016-08-26 DIAGNOSIS — I2111 ST elevation (STEMI) myocardial infarction involving right coronary artery: Secondary | ICD-10-CM | POA: Diagnosis not present

## 2016-08-26 DIAGNOSIS — I25118 Atherosclerotic heart disease of native coronary artery with other forms of angina pectoris: Secondary | ICD-10-CM | POA: Diagnosis not present

## 2016-08-26 DIAGNOSIS — Z955 Presence of coronary angioplasty implant and graft: Secondary | ICD-10-CM | POA: Diagnosis not present

## 2016-09-06 ENCOUNTER — Other Ambulatory Visit: Payer: Self-pay | Admitting: Neurosurgery

## 2016-09-06 DIAGNOSIS — Z6839 Body mass index (BMI) 39.0-39.9, adult: Secondary | ICD-10-CM | POA: Diagnosis not present

## 2016-09-06 DIAGNOSIS — D1800 Hemangioma unspecified site: Secondary | ICD-10-CM

## 2016-09-06 DIAGNOSIS — D1801 Hemangioma of skin and subcutaneous tissue: Secondary | ICD-10-CM | POA: Diagnosis not present

## 2016-09-12 ENCOUNTER — Ambulatory Visit
Admission: RE | Admit: 2016-09-12 | Discharge: 2016-09-12 | Disposition: A | Discharge status: Home / Self Care | Payer: BLUE CROSS/BLUE SHIELD | Source: Ambulatory Visit | Attending: Neurosurgery | Admitting: Neurosurgery

## 2016-09-12 DIAGNOSIS — D1800 Hemangioma unspecified site: Secondary | ICD-10-CM

## 2016-09-19 DIAGNOSIS — I2510 Atherosclerotic heart disease of native coronary artery without angina pectoris: Secondary | ICD-10-CM | POA: Diagnosis not present

## 2016-09-19 DIAGNOSIS — E785 Hyperlipidemia, unspecified: Secondary | ICD-10-CM | POA: Diagnosis not present

## 2016-09-19 DIAGNOSIS — I2119 ST elevation (STEMI) myocardial infarction involving other coronary artery of inferior wall: Secondary | ICD-10-CM | POA: Diagnosis not present

## 2016-09-19 DIAGNOSIS — I10 Essential (primary) hypertension: Secondary | ICD-10-CM | POA: Diagnosis not present

## 2016-09-26 ENCOUNTER — Other Ambulatory Visit: Payer: Self-pay | Admitting: Family

## 2016-11-19 ENCOUNTER — Ambulatory Visit (INDEPENDENT_AMBULATORY_CARE_PROVIDER_SITE_OTHER): Payer: BLUE CROSS/BLUE SHIELD | Admitting: Family

## 2016-11-19 ENCOUNTER — Encounter: Payer: Self-pay | Admitting: Family

## 2016-11-19 VITALS — BP 124/74 | HR 74 | Temp 98.2°F | Resp 16 | Ht 64.0 in | Wt 238.0 lb

## 2016-11-19 DIAGNOSIS — I2111 ST elevation (STEMI) myocardial infarction involving right coronary artery: Secondary | ICD-10-CM

## 2016-11-19 DIAGNOSIS — I219 Acute myocardial infarction, unspecified: Secondary | ICD-10-CM | POA: Insufficient documentation

## 2016-11-19 DIAGNOSIS — Z23 Encounter for immunization: Secondary | ICD-10-CM

## 2016-11-19 DIAGNOSIS — F418 Other specified anxiety disorders: Secondary | ICD-10-CM | POA: Diagnosis not present

## 2016-11-19 MED ORDER — CLONAZEPAM 0.5 MG PO TABS: 0.2500 mg | ORAL_TABLET | Freq: Two times a day (BID) | ORAL | 0 refills | Status: DC | PRN

## 2016-11-19 NOTE — Assessment & Plan Note (Signed)
Status post ST segment myocardial infarction with no evidence of chest pain but there is fatigue. Awaiting start of cardiac rehabilitation. Continue with risk factor management. Has recently quit smoking. Started on several new medications that were discussed in detail. Continue current dosage of carvedilol, atorvastatin, aspirin and ticagrelor. Follow up and changes per cardiology.

## 2016-11-19 NOTE — Progress Notes (Signed)
Subjective:    Patient ID: Theresa Gibson, female    DOB: [birth year], 58 y.o.   MRN: [id number]  Chief Complaint  Patient presents with  . Follow-up    had a recent heart attack in october, wanted to go over medication changes and talk more about medications    HPI:  Theresa Gibson is a 60 y.o. female who  has a past medical history of Allergy; Arthritis; Depression; GERD (gastroesophageal reflux disease); Hypertension; Meningioma ([place]); and PONV (postoperative nausea and vomiting). and presents today for a follow up.  Recently evaluated in the emergency department and admitted to the hospital with chest pain found to be ST segment elevated inferior myocardial infarction. She was taken to the Cath Lab where she was noted to have significant stenosis in the right coronary artery. 2 stents were placed with good resolution and she felt well afterwards with no complication. She was started on aspirin, carvedilol, atorvastatin and ticagrelor. All outside hospital records, labs and imaging were reviewed in detail.   Since leaving the hospital she reports that she reports that she is feeling better with no further chest pain. She reports taking her medication as prescribed. Has noted some shortness of breath and fatigue. Awaiting cardiac rehabilitation. Has quit smoking. Involved in insurance health monitoring.  Does have occasional anxiety following this event. Ticagrelor and aspirin with mild increased in bruising with no other nuisance bleeding.    Allergies  Allergen Reactions  . Ace Inhibitors Cough    REACTION: Dry cough-lisinopril       Outpatient Medications Prior to Visit  Medication Sig Dispense Refill  . topiramate (TOPAMAX) 50 MG tablet TAKE 1 TAB BY MOUTH TWICE DAILY 180 tablet 2  . cetirizine (ZYRTEC) 10 MG tablet Take 10 mg by mouth daily as needed for allergies.     . Cholecalciferol (VITAMIN D-3) 1000 units CAPS Take 1,000 Units by mouth daily.    . phentermine 30 MG  capsule Take 1 capsule (30 mg total) by mouth every morning. 30 capsule 0  . varenicline (CHANTIX STARTING MONTH PAK) 0.5 MG X 11 & 1 MG X 42 tablet Take one 0.5 mg tablet by mouth once daily for 3 days, then increase to one 0.5 mg tablet twice daily for 4 days, then increase to one 1 mg tablet twice daily. 53 tablet 0  . vitamin B-12 (CYANOCOBALAMIN) 1000 MCG tablet Take 1,000 mcg by mouth daily.     No facility-administered medications prior to visit.       Past Surgical History:  Procedure Laterality Date  . ABDOMINAL HYSTERECTOMY     Total   . KNEE SURGERY Bilateral    arthroscopy  . LAPAROSCOPIC APPENDECTOMY N/A 12/10/2015   Procedure: APPENDECTOMY LAPAROSCOPIC;  Surgeon: Erroll Luna, MD;  Location: [facility];  Service: General;  Laterality: N/A;      Past Medical History:  Diagnosis Date  . Allergy   . Arthritis   . Depression   . GERD (gastroesophageal reflux disease)   . Hypertension   . Meningioma ([place])   . PONV (postoperative nausea and vomiting)    nausea only      Review of Systems  Constitutional: Positive for fatigue. Negative for chills and fever.  Respiratory: Positive for shortness of breath. Negative for chest tightness.   Cardiovascular: Negative for chest pain, palpitations and leg swelling.  Neurological: Negative for dizziness, syncope and weakness.      Objective:    BP 124/74 (BP Location: Left Arm,  Patient Position: Sitting, Cuff Size: Large)   Pulse 74   Temp 98.2 F (36.8 C) (Oral)   Resp 16   Ht 5\' 4"  (1.626 m)   Wt 238 lb (108 kg)   SpO2 97%   BMI 40.85 kg/m  Nursing note and vital signs reviewed.  Physical Exam  Constitutional: She is oriented to person, place, and time. She appears well-developed and well-nourished.  Non-toxic appearance. She does not have a sickly appearance. She does not appear ill. No distress.  Cardiovascular: Normal rate, regular rhythm, normal heart sounds and intact distal pulses.   Pulmonary/Chest: Effort  normal and breath sounds normal.  Neurological: She is alert and oriented to person, place, and time.  Skin: Skin is warm and dry. She is not diaphoretic.  Psychiatric: Her behavior is normal. Judgment and thought content normal. Her mood appears anxious.       Assessment & Plan:   Problem List Items Addressed This Visit      Cardiovascular and Mediastinum   Myocardial infarction - Primary    Status post ST segment myocardial infarction with no evidence of chest pain but there is fatigue. Awaiting start of cardiac rehabilitation. Continue with risk factor management. Has recently quit smoking. Started on several new medications that were discussed in detail. Continue current dosage of carvedilol, atorvastatin, aspirin and ticagrelor. Follow up and changes per cardiology.      Relevant Medications   aspirin EC 81 MG tablet   atorvastatin (LIPITOR) 40 MG tablet   carvedilol (COREG) 3.125 MG tablet   nitroGLYCERIN (NITROSTAT) 0.4 MG SL tablet     Other   Situational anxiety    Symptoms and exam are consistent with situational anxiety in the setting of status post MI and quitting smoking. Symptoms are not daily and generally wax and wane. Discuss stress and stress management techniques. Start low dose clonazepam as needed. Follow up if symptoms worsen or do not improve.       Relevant Medications   clonazePAM (KLONOPIN) 0.5 MG tablet    Other Visit Diagnoses    Encounter for immunization       Relevant Orders   Flu Vaccine QUAD 36+ mos IM (Completed)       I have discontinued Ms. Heimsoth's cetirizine, Vitamin D-3, vitamin B-12, phentermine, varenicline, and topiramate. I am also having her start on clonazePAM. Additionally, I am having her maintain her aspirin EC, atorvastatin, carvedilol, nitroGLYCERIN, ondansetron, and ticagrelor.   Meds ordered this encounter  Medications  . aspirin EC 81 MG tablet    Sig: Take 81 mg by mouth daily.  Marland Kitchen atorvastatin (LIPITOR) 40 MG tablet     Sig: Take 40 mg by mouth daily.  . carvedilol (COREG) 3.125 MG tablet    Sig: Take 3.125 mg by mouth 2 (two) times daily with a meal.  . nitroGLYCERIN (NITROSTAT) 0.4 MG SL tablet    Sig: Place 0.4 mg under the tongue every 5 (five) minutes as needed for chest pain.  Marland Kitchen ondansetron (ZOFRAN) 4 MG tablet    Sig: Take 4 mg by mouth every 8 (eight) hours as needed for nausea or vomiting.  . ticagrelor (BRILINTA) 90 MG TABS tablet    Sig: Take 90 mg by mouth 2 (two) times daily.  . clonazePAM (KLONOPIN) 0.5 MG tablet    Sig: Take 0.5-1 tablets (0.25-0.5 mg total) by mouth 2 (two) times daily as needed for anxiety.    Dispense:  30 tablet    Refill:  0  Order Specific Question:   Supervising Provider    Answer:   Pricilla Holm A [id number]     Follow-up: Return in about 1 month (around 12/20/2016), or if symptoms worsen or fail to improve.  Mauricio Po, FNP

## 2016-11-19 NOTE — Patient Instructions (Addendum)
Thank you for choosing [facility].  SUMMARY AND INSTRUCTIONS:  Continue to follow up with cardiology.  Medication:  Please continue to take your medications as prescribed.  Start the clonazepam as needed for anxiety. Try to avoid using it for sleep.   Follow up:  If your symptoms worsen or fail to improve, please contact our office for further instruction, or in case of emergency go directly to the emergency room at the closest medical facility.    Physical Activity With Heart Disease Exercising has many benefits, even when you have heart disease. It can help control cholesterol and high blood pressure and improve sleep. It can make your bones and heart muscle stronger. If you exercise about 20-30 minutes per day, 5 times per week, you will be able to do more and feel healthier. Before starting an exercise program, talk with your health care provider about exercising. Your health care provider may recommend a physical exam before starting an exercise program. Your health care provider can match your fitness level with the right exercise program. How can being active help improve my health? When exercise is done on a regular basis, there are many benefits. Exercise can:  Lower your blood pressure.  Lower your cholesterol.  Control your weight.  Improve your sleep.  Help control your blood sugars.  Improve your heart and lung function.  Strengthen your bones and reduce bone loss.  Reduce your risk of blood clots (thrombophlebitis).  Lower your risk of certain cancers.  Improve your energy level.  Reduce stress.  Make you feel better about yourself. How do I begin a heart-healthy exercise program?  Talk with your health care provider about how often to exercise and what types of exercise would be good for you. Ask your health care provider if:  You should engage in moderate or vigorous cardiovascular exercise.  There are any exercises you should avoid.  Your  medicines should be taken at a certain time.  You should check your pulse or take other precautions during exercise.  Get a calendar. Write down a schedule and plan for your exercise routine.  Consider joining a community exercise program, such as a biking group, yoga class, or swimming pool membership. You can also join a local gym. You can find free workout applications on some phones or devices, or purchase workout DVDs and exercise on your own. Find out what works for you.  After checking with your health care provider about approved activities, try to incorporate a variety of activities into your plan. For instance, you may do a yoga class Tuesdays and Thursdays, walk or climb stairs Mondays and Wednesdays, and then lift small weights while you are watching your favorite show on Sunday night.  If you have not been exercising, begin with sessions that last 10 to 15 minutes. Gradually work up to sessions that last 20 to 30 minutes. Try to exercise 5 times per week. Follow all of your health care provider's recommendations.  Be patient with yourself. It takes time to build up strength and lung capacity. What are some types of exercises I could try? Cardiovascular exercise gets your heart and lungs working. Depending on your speed and intensity, a cardiovascular activity can be moderate or can become vigorous. Watch your pace and follow your health care provider's recommendations about the intensity of your workouts. Moderate cardiovascular exercise includes:  Walking.  Slow bicycling.  Water aerobics.  Doubles tennis.  Ballroom dancing.  Light gardening or yard work. Vigorous cardiovascular exercise  includes:  Jogging or running.  Jumping rope.  Singles tennis.  Stair climbing.  Swimming laps.  Cross-country skiing.  Hiking uphill.  Heavy gardening, such as digging trenches. Strengthening exercises tense your muscles to build strength. Some examples include:  Doing  push-ups and abdominal work.  Lifting small weights.  Using resistance bands.  Doing exercises with a medicine ball.  Doing pull-ups on a pull-up bar. Flexibility exercises lengthen your muscles to keep them flexible and less tight and improve balance. Some examples include:  Stretching.  Yoga.  Tai chi.  [place] barre. What other things will help me be successful?  If you do not enjoy an activity, try a new one. Keep doing new things until you find exercises that you like.  Drink plenty of water before, during, and after exercise.  Eat meals about 2 hours before you exercise. If you need to snack right before, eat fruit such as a banana or apple.  Wear clothing that is comfortable, fits well, and is appropriate for the weather. Also be sure to wear supportive shoes.  Warm up or stretch for 5 minutes before exercise. Slowly decrease your activity or cool down for 5 minutes after exercise. This can help prevent injury. Ask your health care provider for more information.  Always exercise within your abilities and be aware of what your body is able to handle.  If you belong to a gym, ask the staff to help you learn how to use equipment and exercise machines. Ask a physical therapist or trainer about proper form and techniques to prevent injury. Are there any precautions I might need to take?  Depending on your condition, you may need to work closely with your health care provider or specialist to come up with an approved exercise program. Follow your health care provider's instructions for recovery, cardiac rehabilitation, and a long-term exercise plan.  Because of your heart condition, you are more sensitive to weather and environmental changes and need to be cautious. If there are extreme outdoor conditions, such as heat, humidity, or cold, you may need to exercise indoors. If chemicals or other pollutants in the air reach an unsafe level and there is an air pollution advisory, you  may need to exercise indoors. Your local news, board of health, or hospital can provide information on air quality. Exercise in an indoor, climate-controlled facility if these conditions exist.  Monitor your pulse if directed by your health care provider.  If you feel tired, or have any heart symptoms, such as shortness of breath, dizziness, irregular heartbeat, or chest pains, stop exercising and call your health care provider or 911.  If you have certain types of heart disease, you may need to take extra precautions. These may include:  Avoiding heavy lifting.  Avoiding strenuous activities.  Making sure you include a cool-down period to give your body time to adjust.  Understanding how your medicines can affect you during exercise. Certain medicines may cause heat intolerance and changes in blood sugar.  Knowing your regular symptoms. Slow down and rest when you need to. Call your health care provider if they happen more, last longer, or feel stronger.  Keeping nitroglycerin spray and tabletswith you at all times if you have angina. Use them as directed to prevent and treat symptoms. When should I see my health care provider?  You have chest pain, shortness of breath, or feel very tired.  You have pain in the arm, shoulder, neck, or jaw.  You feel weak, dizzy,  or light-headed.  You have an irregular heart rate or your heart rate is greater than 100 beats per minute (bpm) before exercise. This information is not intended to replace advice given to you by your health care provider. Make sure you discuss any questions you have with your health care provider. Document Released: 06/16/2014 Document Revised: 04/23/2016 Document Reviewed: 03/23/2014 Elsevier Interactive Patient Education  2017 [employer].

## 2016-11-19 NOTE — Assessment & Plan Note (Signed)
Symptoms and exam are consistent with situational anxiety in the setting of status post MI and quitting smoking. Symptoms are not daily and generally wax and wane. Discuss stress and stress management techniques. Start low dose clonazepam as needed. Follow up if symptoms worsen or do not improve.

## 2016-11-21 ENCOUNTER — Telehealth: Payer: Self-pay | Admitting: *Deleted

## 2016-11-21 NOTE — Telephone Encounter (Signed)
Ok to call in

## 2016-11-21 NOTE — Telephone Encounter (Signed)
Pt left msg on triage stating saw [name] on Monday, and he called in clonazepam to [facility]. Was told by [facility] that they could not fill the clonazepam there would have to have filled at local [place]. Requesting rx to be called into [facility] on [address]. Is this ok to call in...Theresa Gibson

## 2016-11-22 NOTE — Telephone Encounter (Signed)
Notified pt rx has been called into [facility] had to leave on pharmacy vm...Theresa Gibson

## 2016-12-24 DIAGNOSIS — J329 Chronic sinusitis, unspecified: Secondary | ICD-10-CM | POA: Diagnosis not present

## 2017-01-03 ENCOUNTER — Encounter (HOSPITAL_BASED_OUTPATIENT_CLINIC_OR_DEPARTMENT_OTHER): Payer: Self-pay

## 2017-01-03 ENCOUNTER — Emergency Department (HOSPITAL_BASED_OUTPATIENT_CLINIC_OR_DEPARTMENT_OTHER)
Admission: EM | Admit: 2017-01-03 | Discharge: 2017-01-04 | Disposition: A | Discharge status: Home / Self Care | Payer: BLUE CROSS/BLUE SHIELD | Attending: Emergency Medicine | Admitting: Emergency Medicine

## 2017-01-03 DIAGNOSIS — Z7982 Long term (current) use of aspirin: Secondary | ICD-10-CM | POA: Insufficient documentation

## 2017-01-03 DIAGNOSIS — I10 Essential (primary) hypertension: Secondary | ICD-10-CM | POA: Insufficient documentation

## 2017-01-03 DIAGNOSIS — R0981 Nasal congestion: Secondary | ICD-10-CM | POA: Diagnosis not present

## 2017-01-03 DIAGNOSIS — Z87891 Personal history of nicotine dependence: Secondary | ICD-10-CM | POA: Insufficient documentation

## 2017-01-03 NOTE — ED Triage Notes (Signed)
C/o cont'd nasal congestion, sneezing-completed abx and steroids for sinus infection-NAD-steady gait-obvious nasal congestion noted

## 2017-01-04 ENCOUNTER — Emergency Department (HOSPITAL_BASED_OUTPATIENT_CLINIC_OR_DEPARTMENT_OTHER): Payer: BLUE CROSS/BLUE SHIELD

## 2017-01-04 DIAGNOSIS — R0981 Nasal congestion: Secondary | ICD-10-CM | POA: Diagnosis not present

## 2017-01-04 MED ORDER — TRIAMCINOLONE ACETONIDE 55 MCG/ACT NA AERO: 2.0000 | INHALATION_SPRAY | Freq: Every day | NASAL | Status: DC

## 2017-01-04 MED ORDER — OXYMETAZOLINE HCL 0.05 % NA SOLN
2.0000 | Freq: Two times a day (BID) | NASAL | Status: DC | PRN
  Administered 2017-01-04: 2 via NASAL
  Filled 2017-01-04: qty 15

## 2017-01-04 NOTE — ED Notes (Signed)
Patient transported to CT 

## 2017-01-04 NOTE — ED Provider Notes (Signed)
[place] [facility] Provider Note: Georgena Spurling, MD, FACEP  CSN: [id number] MRN: [id number] ARRIVAL: 01/03/17 at 2243 ROOM: [place]  Nasal Congestion   HISTORY OF PRESENT ILLNESS  Theresa Gibson is a 61 y.o. female with about a 3 week history of nasal congestion, sinus drainage with throat irritation, sinus pressure and sneezing. She was seen by a doctor at [facility] and placed on a ten-day course of Augmentin which she finished yesterday. She was also placed on 3 days of prednisone. She got relief from the prednisone but her symptoms returned. She does not believe the Augmentin did her any good. The Augmentin did give her diarrhea. She has had some chills with this but no fever. She has had some ringing in her right ear; she does take 81 milligrams of aspirin daily.   Past Medical History:  Diagnosis Date  . Allergy   . Arthritis   . Depression   . GERD (gastroesophageal reflux disease)   . Hypertension   . Meningioma ([place])   . Myocardial infarct   . PONV (postoperative nausea and vomiting)    nausea only    Past Surgical History:  Procedure Laterality Date  . ABDOMINAL HYSTERECTOMY     Total   . CORONARY ANGIOPLASTY WITH STENT PLACEMENT    . KNEE SURGERY Bilateral    arthroscopy  . LAPAROSCOPIC APPENDECTOMY N/A 12/10/2015   Procedure: APPENDECTOMY LAPAROSCOPIC;  Surgeon: Erroll Luna, MD;  Location: [facility];  Service: General;  Laterality: N/A;    Family History  Problem Relation Age of Onset  . Hypertension Mother   . Arthritis Mother   . Heart attack Father     In his 63's  . Ovarian cancer Maternal Grandmother   . Colon cancer Neg Hx   . Colon polyps Neg Hx   . Rectal cancer Neg Hx   . Stomach cancer Neg Hx     Social History  Substance Use Topics  . Smoking status: Former Smoker    Packs/day: 0.50    Years: 42.00    Types: Cigarettes  . Smokeless tobacco: Never Used  . Alcohol use 0.0 oz/week     Comment: occ    Prior to  Admission medications   Medication Sig Start Date End Date Taking? Authorizing Provider  aspirin EC 81 MG tablet Take 81 mg by mouth daily.    Historical Provider, MD  atorvastatin (LIPITOR) 40 MG tablet Take 40 mg by mouth daily.    Historical Provider, MD  carvedilol (COREG) 3.125 MG tablet Take 3.125 mg by mouth 2 (two) times daily with a meal.    Historical Provider, MD  clonazePAM (KLONOPIN) 0.5 MG tablet Take 0.5-1 tablets (0.25-0.5 mg total) by mouth 2 (two) times daily as needed for anxiety. 11/19/16   Golden Circle, FNP  nitroGLYCERIN (NITROSTAT) 0.4 MG SL tablet Place 0.4 mg under the tongue every 5 (five) minutes as needed for chest pain.    Historical Provider, MD  ondansetron (ZOFRAN) 4 MG tablet Take 4 mg by mouth every 8 (eight) hours as needed for nausea or vomiting.    Historical Provider, MD  ticagrelor (BRILINTA) 90 MG TABS tablet Take 90 mg by mouth 2 (two) times daily.    Historical Provider, MD  triamcinolone (NASACORT) 55 MCG/ACT AERO nasal inhaler Place 2 sprays into the nose daily. 01/04/17   Shanon Rosser, MD    Allergies Ace inhibitors   REVIEW OF SYSTEMS  Negative except as noted  here or in the History of Present Illness.   PHYSICAL EXAMINATION  Initial Vital Signs Blood pressure 139/79, pulse 80, temperature 98 F (36.7 C), temperature source Oral, resp. rate 22, height 5\' 4"  (1.626 m), weight 238 lb (108 kg), SpO2 100 %.  Examination General: Well-developed, well-nourished female in no acute distress; appearance consistent with age of record HENT: normocephalic; atraumatic; sinus congestion; edematous turbinates; no pharyngeal erythema or exudate; TMs normal Eyes: pupils equal, round and reactive to light; extraocular muscles intact Neck: supple Heart: regular rate and rhythm Lungs: clear to auscultation bilaterally Abdomen: soft; nondistended; nontender; bowel sounds present Extremities: No deformity; full range of motion; pulses normal Neurologic:  Awake, alert and oriented; motor function intact in all extremities and symmetric; no facial droop Skin: Warm and dry Psychiatric: Normal mood and affect   RESULTS  Summary of this visit's results, reviewed by myself:   EKG Interpretation  Date/Time:    Ventricular Rate:    PR Interval:    QRS Duration:   QT Interval:    QTC Calculation:   R Axis:     Text Interpretation:        Laboratory Studies: No results found for this or any previous visit (from the past 24 hour(s)). Imaging Studies: [place] [facility]  Result Date: 01/04/2017 CLINICAL DATA:  Sneezing and nasal congestion for 3 weeks. No trauma. EXAM: CT PARANASAL SINUS LIMITED WITHOUT CONTRAST TECHNIQUE: Non-contiguous multidetector CT images of the paranasal sinuses were obtained in a single plane without contrast. COMPARISON:  09/12/2016. FINDINGS: There are air-fluid levels in both maxillary sinuses. Maxillary infundibula are patent. Mild membrane thickening in the left ethmoid air cells. Frontal and sphenoid sinuses are clear. No mass or bony destruction. Nasal airway is unremarkable. IMPRESSION: Bilateral maxillary sinus air-fluid levels. Electronically Signed   By: Andreas Newport M.D.   On: 01/04/2017 01:44    ED COURSE  Nursing notes and initial vitals signs, including pulse oximetry, reviewed.  Vitals:   01/03/17 2250 01/04/17 0200  BP: 139/79 128/74  Pulse: 80 89  Resp: 22 20  Temp: 98 F (36.7 C)   TempSrc: Oral   SpO2: 100% 100%  Weight: 238 lb (108 kg)   Height: 5\' 4"  (1.626 m)    After review of the CT and think the patient's symptoms are related more to swollen turbinates than to actual sinusitis. I do not believe an antibiotic is indicated this time. We will treat her with 3 days of Afrin and place her on a nasal steroid. We will refer to ENT if she is not getting improvement.  PROCEDURES    ED DIAGNOSES     ICD-9-CM ICD-10-CM   1. Sinus congestion 478.19 R09.81 CT MAXILLOFACIAL   LTD WO CM     CT MAXILLOFACIAL  LTD WO CM       Shanon Rosser, MD 01/04/17 [id number]

## 2017-01-04 NOTE — ED Notes (Signed)
ED Provider at bedside. 

## 2017-01-28 ENCOUNTER — Telehealth: Payer: Self-pay | Admitting: Family

## 2017-01-28 NOTE — Telephone Encounter (Signed)
Needs follow up to discuss treatment for depression. Has not taken it since 2016.

## 2017-01-28 NOTE — Telephone Encounter (Signed)
Please advise as medication is no longer on pts list and it looks like she has not taken it in a while

## 2017-01-28 NOTE — Telephone Encounter (Signed)
Pt called request Wellbutrine send to [facility]. Please advise.

## 2017-01-29 NOTE — Telephone Encounter (Signed)
Called to let pt know that she needs an appointment for refill of wellbutrin. She stated that she will call back and make the appointment.

## 2017-02-06 DIAGNOSIS — J343 Hypertrophy of nasal turbinates: Secondary | ICD-10-CM | POA: Diagnosis not present

## 2017-02-06 DIAGNOSIS — J324 Chronic pansinusitis: Secondary | ICD-10-CM | POA: Diagnosis not present

## 2017-02-06 DIAGNOSIS — J3089 Other allergic rhinitis: Secondary | ICD-10-CM | POA: Diagnosis not present

## 2017-02-18 ENCOUNTER — Ambulatory Visit: Payer: Self-pay | Admitting: Family

## 2017-02-18 DIAGNOSIS — J343 Hypertrophy of nasal turbinates: Secondary | ICD-10-CM | POA: Diagnosis not present

## 2017-02-18 DIAGNOSIS — J324 Chronic pansinusitis: Secondary | ICD-10-CM | POA: Diagnosis not present

## 2017-02-22 ENCOUNTER — Ambulatory Visit (INDEPENDENT_AMBULATORY_CARE_PROVIDER_SITE_OTHER): Payer: BLUE CROSS/BLUE SHIELD | Admitting: Family

## 2017-02-22 ENCOUNTER — Encounter: Payer: Self-pay | Admitting: Family

## 2017-02-22 VITALS — BP 116/70 | HR 64 | Temp 98.0°F | Resp 16 | Ht 64.0 in | Wt 246.0 lb

## 2017-02-22 DIAGNOSIS — IMO0001 Reserved for inherently not codable concepts without codable children: Secondary | ICD-10-CM

## 2017-02-22 DIAGNOSIS — F324 Major depressive disorder, single episode, in partial remission: Secondary | ICD-10-CM | POA: Diagnosis not present

## 2017-02-22 DIAGNOSIS — E6609 Other obesity due to excess calories: Secondary | ICD-10-CM

## 2017-02-22 DIAGNOSIS — Z6841 Body Mass Index (BMI) 40.0 and over, adult: Secondary | ICD-10-CM | POA: Diagnosis not present

## 2017-02-22 MED ORDER — BUPROPION HCL ER (SR) 150 MG PO TB12: ORAL_TABLET | ORAL | 0 refills | Status: DC

## 2017-02-22 NOTE — Assessment & Plan Note (Signed)
Increase levels of depression and anxiety secondary to recent the quitting tobacco use with decreased coping mechanisms and stress management. Is using food as a stress relief which is also contributing to her depression feelings. Denies suicidal ideations. Recommend stress and stress management. Discussed counseling which patient declines at this time. Start Wellbutrin. Follow-up in one month or sooner. Continue to monitor.

## 2017-02-22 NOTE — Progress Notes (Signed)
Subjective:    Patient ID: Theresa Gibson, female    DOB: 1956/07/30, 61 y.o.   MRN: [id number]  Chief Complaint  Patient presents with  . Depression    stopped smoking a couple months ago and has gained weight and been depressed    HPI:  Theresa Gibson is a 61 y.o. female who  has a past medical history of Allergy; Arthritis; Depression; GERD (gastroesophageal reflux disease); Hypertension; Meningioma ([place]); Myocardial infarct; and PONV (postoperative nausea and vomiting). and presents today for a follow up office visit.  Recently quit smoking and has noted increased in depression and weight gain since having her heart attack in December 2017. Describes having difficulties with eating and does not move around a lot. Eating on average about 3 meals per day consisting of a regular diet. Describes "eating the wrong things" most of the time. She does consume a lot of fast food type foods. Has hunger all the time. Difficulty with stress management. Denies suicidal ideation.   Wt Readings from Last 3 Encounters:  02/22/17 246 lb (111.6 kg)  01/03/17 238 lb (108 kg)  11/19/16 238 lb (108 kg)     Allergies  Allergen Reactions  . Nicotine     Patch...caused a rash  . Ace Inhibitors Cough    REACTION: Dry cough-lisinopril       Outpatient Medications Prior to Visit  Medication Sig Dispense Refill  . acetaminophen (TYLENOL) 500 MG tablet Take 1,000 mg by mouth every 6 (six) hours as needed for moderate pain.    Marland Kitchen aspirin EC 81 MG tablet Take 81 mg by mouth daily.    Marland Kitchen atorvastatin (LIPITOR) 40 MG tablet Take 40 mg by mouth daily at 6 PM.     . carvedilol (COREG) 3.125 MG tablet Take 3.125 mg by mouth 2 (two) times daily with a meal.    . cetirizine (ZYRTEC) 10 MG tablet Take 10 mg by mouth daily as needed for allergies.    . cholecalciferol (VITAMIN D) 1000 units tablet Take 1,000 Units by mouth daily.    . clonazePAM (KLONOPIN) 0.5 MG tablet Take 0.5-1 tablets (0.25-0.5 mg total)  by mouth 2 (two) times daily as needed for anxiety. 30 tablet 0  . losartan (COZAAR) 50 MG tablet Take 50 mg by mouth daily.    . nitroGLYCERIN (NITROSTAT) 0.4 MG SL tablet Place 0.4 mg under the tongue every 5 (five) minutes as needed for chest pain.    . ticagrelor (BRILINTA) 90 MG TABS tablet Take 90 mg by mouth 2 (two) times daily.    Marland Kitchen topiramate (TOPAMAX) 50 MG tablet Take 50 mg by mouth 2 (two) times daily.    Marland Kitchen triamcinolone (NASACORT) 55 MCG/ACT AERO nasal inhaler Place 2 sprays into the nose daily.     No facility-administered medications prior to visit.       Past Surgical History:  Procedure Laterality Date  . ABDOMINAL HYSTERECTOMY     Total   . CORONARY ANGIOPLASTY WITH STENT PLACEMENT    . KNEE SURGERY Bilateral    arthroscopy  . LAPAROSCOPIC APPENDECTOMY N/A 12/10/2015   Procedure: APPENDECTOMY LAPAROSCOPIC;  Surgeon: Erroll Luna, MD;  Location: [facility];  Service: General;  Laterality: N/A;      Past Medical History:  Diagnosis Date  . Allergy   . Arthritis   . Depression   . GERD (gastroesophageal reflux disease)   . Hypertension   . Meningioma ([place])   . Myocardial infarct   .  PONV (postoperative nausea and vomiting)    nausea only      Review of Systems  Constitutional: Negative for chills and fever.  Respiratory: Negative for chest tightness and shortness of breath.   Cardiovascular: Negative for chest pain, palpitations and leg swelling.  Psychiatric/Behavioral: Positive for dysphoric mood. Negative for sleep disturbance and suicidal ideas. The patient is nervous/anxious.       Objective:    BP 116/70 (BP Location: Left Arm, Patient Position: Sitting, Cuff Size: Large)   Pulse 64   Temp 98 F (36.7 C) (Oral)   Resp 16   Ht 5\' 4"  (1.626 m)   Wt 246 lb (111.6 kg)   SpO2 98%   BMI 42.23 kg/m  Nursing note and vital signs reviewed.  Physical Exam  Constitutional: She is oriented to person, place, and time. She appears well-developed and  well-nourished. No distress.  Cardiovascular: Normal rate, regular rhythm, normal heart sounds and intact distal pulses.   Pulmonary/Chest: Effort normal and breath sounds normal.  Neurological: She is alert and oriented to person, place, and time.  Skin: Skin is warm and dry.  Psychiatric: She has a normal mood and affect. Her behavior is normal. Judgment and thought content normal.       Assessment & Plan:   Problem List Items Addressed This Visit      Other   Obesity    Depression symptoms secondary to quitting smoking with stress and stress management and increased weight with currently poor nutritional habits. Discussed importance of eating a diet that is balanced, varied, and moderate. Recommend calorie tracking software/application. Encouraged a caloric intake of 1200-1500 calories per day with gradually increasing physical activity. Consider Rickard Patience, Weight Watchers, or Nutrisystem. Wellbutrin started for depression which may also help with weight loss. Follow up in 1 month or sooner if needed.       Depression - Primary    Increase levels of depression and anxiety secondary to recent the quitting tobacco use with decreased coping mechanisms and stress management. Is using food as a stress relief which is also contributing to her depression feelings. Denies suicidal ideations. Recommend stress and stress management. Discussed counseling which patient declines at this time. Start Wellbutrin. Follow-up in one month or sooner. Continue to monitor.      Relevant Medications   buPROPion (WELLBUTRIN SR) 150 MG 12 hr tablet       I am having [name] start on buPROPion. I am also having her maintain her aspirin EC, atorvastatin, carvedilol, nitroGLYCERIN, ticagrelor, clonazePAM, triamcinolone, losartan, topiramate, cetirizine, acetaminophen, and cholecalciferol.   Meds ordered this encounter  Medications  . buPROPion (WELLBUTRIN SR) 150 MG 12 hr tablet    Sig: Take 1 tablet  by mouth daily for 3 days then increase to 1 tablet by mouth daily.    Dispense:  60 tablet    Refill:  0    Order Specific Question:   Supervising Provider    Answer:   Pricilla Holm A [[id number]]    A total of 25 minutes were spent face-to-face with the patient during this encounter and over half of that time was spent on counseling and coordination of care.  We discussed in depth the importance of stress and stress management techniques as well as the quality of the nutrition and caloric intake for weight loss in combination with physical activity.   Follow-up: Return in about 1 month (around 03/25/2017), or if symptoms worsen or fail to improve.  Mauricio Po, FNP

## 2017-02-22 NOTE — Assessment & Plan Note (Signed)
Depression symptoms secondary to quitting smoking with stress and stress management and increased weight with currently poor nutritional habits. Discussed importance of eating a diet that is balanced, varied, and moderate. Recommend calorie tracking software/application. Encouraged a caloric intake of 1200-1500 calories per day with gradually increasing physical activity. Consider Rickard Patience, Weight Watchers, or Nutrisystem. Wellbutrin started for depression which may also help with weight loss. Follow up in 1 month or sooner if needed.

## 2017-02-22 NOTE — Patient Instructions (Signed)
Thank you for choosing [facility].  SUMMARY AND INSTRUCTIONS:  Recommend 1200-1500 calories per day.  Work on increasing protein to 70-80 mg daily.   Continue with physical activity with goal of 10,000 steps daily.   Start Wellbutrin daily.   Medication:  Your prescription(s) have been submitted to your pharmacy or been printed and provided for you. Please take as directed and contact our office if you believe you are having problem(s) with the medication(s) or have any questions.  Follow up:  If your symptoms worsen or fail to improve, please contact our office for further instruction, or in case of emergency go directly to the emergency room at the closest medical facility.

## 2017-02-25 DIAGNOSIS — J329 Chronic sinusitis, unspecified: Secondary | ICD-10-CM | POA: Diagnosis not present

## 2017-02-26 ENCOUNTER — Encounter (HOSPITAL_COMMUNITY): Payer: Self-pay

## 2017-02-26 ENCOUNTER — Encounter (HOSPITAL_COMMUNITY)
Admission: RE | Admit: 2017-02-26 | Discharge: 2017-02-26 | Disposition: A | Discharge status: Home / Self Care | Payer: BLUE CROSS/BLUE SHIELD | Source: Ambulatory Visit | Attending: Otolaryngology | Admitting: Otolaryngology

## 2017-02-26 DIAGNOSIS — I252 Old myocardial infarction: Secondary | ICD-10-CM | POA: Diagnosis not present

## 2017-02-26 DIAGNOSIS — Z87891 Personal history of nicotine dependence: Secondary | ICD-10-CM | POA: Diagnosis not present

## 2017-02-26 DIAGNOSIS — Z90710 Acquired absence of both cervix and uterus: Secondary | ICD-10-CM | POA: Diagnosis not present

## 2017-02-26 DIAGNOSIS — F419 Anxiety disorder, unspecified: Secondary | ICD-10-CM | POA: Diagnosis not present

## 2017-02-26 DIAGNOSIS — Z8041 Family history of malignant neoplasm of ovary: Secondary | ICD-10-CM | POA: Diagnosis not present

## 2017-02-26 DIAGNOSIS — Z7982 Long term (current) use of aspirin: Secondary | ICD-10-CM | POA: Diagnosis not present

## 2017-02-26 DIAGNOSIS — R079 Chest pain, unspecified: Secondary | ICD-10-CM | POA: Diagnosis not present

## 2017-02-26 DIAGNOSIS — Z8249 Family history of ischemic heart disease and other diseases of the circulatory system: Secondary | ICD-10-CM | POA: Diagnosis not present

## 2017-02-26 DIAGNOSIS — I10 Essential (primary) hypertension: Secondary | ICD-10-CM | POA: Diagnosis not present

## 2017-02-26 DIAGNOSIS — F329 Major depressive disorder, single episode, unspecified: Secondary | ICD-10-CM | POA: Diagnosis not present

## 2017-02-26 DIAGNOSIS — Z8261 Family history of arthritis: Secondary | ICD-10-CM | POA: Diagnosis not present

## 2017-02-26 DIAGNOSIS — Z955 Presence of coronary angioplasty implant and graft: Secondary | ICD-10-CM | POA: Diagnosis not present

## 2017-02-26 DIAGNOSIS — Z7902 Long term (current) use of antithrombotics/antiplatelets: Secondary | ICD-10-CM | POA: Diagnosis not present

## 2017-02-26 DIAGNOSIS — K219 Gastro-esophageal reflux disease without esophagitis: Secondary | ICD-10-CM | POA: Diagnosis not present

## 2017-02-26 DIAGNOSIS — Z888 Allergy status to other drugs, medicaments and biological substances status: Secondary | ICD-10-CM | POA: Diagnosis not present

## 2017-02-26 DIAGNOSIS — I491 Atrial premature depolarization: Secondary | ICD-10-CM | POA: Diagnosis not present

## 2017-02-26 DIAGNOSIS — E785 Hyperlipidemia, unspecified: Secondary | ICD-10-CM | POA: Diagnosis not present

## 2017-02-26 DIAGNOSIS — Z79899 Other long term (current) drug therapy: Secondary | ICD-10-CM | POA: Diagnosis not present

## 2017-02-26 HISTORY — DX: Atherosclerotic heart disease of native coronary artery without angina pectoris: I25.10

## 2017-02-26 HISTORY — DX: Presence of dental prosthetic device (complete) (partial): Z97.2

## 2017-02-26 LAB — CBC
HEMATOCRIT: 38.9 % (ref 36.0–46.0)
HEMOGLOBIN: 12.9 g/dL (ref 12.0–15.0)
MCH: 28.3 pg (ref 26.0–34.0)
MCHC: 33.2 g/dL (ref 30.0–36.0)
MCV: 85.3 fL (ref 78.0–100.0)
Platelets: 239 10*3/uL (ref 150–400)
RBC: 4.56 MIL/uL (ref 3.87–5.11)
RDW: 15.1 % (ref 11.5–15.5)
WBC: 8.9 10*3/uL (ref 4.0–10.5)

## 2017-02-26 LAB — BASIC METABOLIC PANEL
ANION GAP: 8 (ref 5–15)
BUN: 12 mg/dL (ref 6–20)
CHLORIDE: 112 mmol/L — AB (ref 101–111)
CO2: 20 mmol/L — ABNORMAL LOW (ref 22–32)
Calcium: 9.2 mg/dL (ref 8.9–10.3)
Creatinine, Ser: 0.87 mg/dL (ref 0.44–1.00)
GFR calc Af Amer: >60 mL/min (ref >60)
GFR calc non Af Amer: >60 mL/min (ref >60)
GLUCOSE: 144 mg/dL — AB (ref 65–99)
POTASSIUM: 3.8 mmol/L (ref 3.5–5.1)
Sodium: 140 mmol/L (ref 135–145)

## 2017-02-26 LAB — PROTIME-INR
INR: 1.06
Prothrombin Time: 13.8 seconds (ref 11.4–15.2)

## 2017-02-26 NOTE — Progress Notes (Signed)
Pt denies any acute cardiopulmonary issues. Pt under the care of Dr. Marijo File, Cardiology. Pt denies having an echo and stress test. Pt denies having recent labs. Pt stated that she was instructed to stop Brilinta and Asprin 5 days prior to procedure, last dose of both was 02/22/17. Requested EKG tracing from [facility]. Pt chart forwarded to anesthesia to review cardiac history and clearance note on chart.

## 2017-02-26 NOTE — Pre-Procedure Instructions (Addendum)
Theresa Gibson  02/26/2017      [place], [place] [address] [place] [address] Phone: [phone] Fax: [place], [place], [place] - [address] [facility]. [place] [address] Phone: [phone] Fax: [place], [place] [address] [place] [address] [place] [address] Phone: [phone] Fax: [phone]    Your procedure is scheduled on Thursday, February 26, 2017  Report to [facility] Admitting at 12:30 P.M.  Call this number if you have problems the morning of surgery:  [phone]   Remember:  Do not eat food or drink liquids after midnight Wednesday, February 27, 2017  Take these medicines the morning of surgery with A SIP OF WATER: buPROPion (WELLBUTRIN SR), carvedilol (COREG), topiramate (TOPAMAX), if needed: Tylenol for pain, cetirizine( Zyrtec) for allergies, clonazePAM (KLONOPIN) for anxiety, nitroGLYCERIN for chest pain Stop taking Aspirin, ticagrelor (BRILINTA), vitamins, fish oil and herbal medications. Do not take any NSAIDs ie: Ibuprofen, Advil, Naproxen, BC and Goody Powder; stop now.  Do not wear jewelry, make-up or nail polish.  Do not wear lotions, powders, or perfumes, or deoderant.  Do not shave 48 hours prior to surgery.    Do not bring valuables to the hospital.  [facility] is not responsible for any belongings or valuables.  Contacts, dentures or bridgework may not be worn into surgery.  Leave your suitcase in the car.  After surgery it may be brought to your room. For patients admitted to the hospital, discharge time will be determined by your treatment team. Patients discharged the day of surgery will not be allowed to drive home.  Special instructions:   [facility] - Preparing for Surgery Before surgery, you can play an important role.  Because skin is not sterile, your skin  needs to be as free of germs as possible.  You can reduce the number of germs on you skin by washing with CHG (chlorahexidine gluconate) soap before surgery.  CHG is an antiseptic cleaner which kills germs and bonds with the skin to continue killing germs even after washing.  Please DO NOT use if you have an allergy to CHG or antibacterial soaps.  If your skin becomes reddened/irritated stop using the CHG and inform your nurse when you arrive at Short Stay.  Do not shave (including legs and underarms) for at least 48 hours prior to the first CHG shower.  You may shave your face.  Please follow these instructions carefully:   1.  Shower with CHG Soap the night before surgery and the morning of Surgery.  2.  If you choose to wash your hair, wash your hair first as usual with your normal shampoo.  3.  After you shampoo, rinse your hair and body thoroughly to remove the Shampoo.  4.  Use CHG as you would any other liquid soap.  You can apply chg directly  to the skin and wash gently with scrungie or a clean washcloth.  5.  Apply the CHG Soap to your body ONLY FROM THE NECK DOWN.  Do not use on open wounds or open sores.  Avoid contact with your eyes, ears, mouth and genitals (private parts).  Wash genitals (private parts) with your normal soap.  6.  Wash thoroughly, paying special attention to the area where your surgery will be performed.  7.  Thoroughly  rinse your body with warm water from the neck down.  8.  DO NOT shower/wash with your normal soap after using and rinsing off the CHG Soap.  9.  Pat yourself dry with a clean towel.            10.  Wear clean pajamas.            11.  Place clean sheets on your bed the night of your first shower and do not sleep with pets.  Day of Surgery  Do not apply any lotions/deodorants the morning of surgery.  Please wear clean clothes to the hospital/surgery center.  Please read over the following fact sheets that you were given. Pain Booklet and Surgical  Site Infection Prevention

## 2017-02-27 ENCOUNTER — Ambulatory Visit (HOSPITAL_COMMUNITY): Payer: BLUE CROSS/BLUE SHIELD | Admitting: Certified Registered Nurse Anesthetist

## 2017-02-27 ENCOUNTER — Ambulatory Visit (HOSPITAL_COMMUNITY): Payer: BLUE CROSS/BLUE SHIELD | Admitting: Vascular Surgery

## 2017-02-27 NOTE — Progress Notes (Signed)
Anesthesia Chart Review: Patient is a 61 year old female posted for endoscopic sinus surgery with navigation, turbinate reduction on 02/28/2017 by Dr. Janace Hoard. Surgeon orders were pending at the time of PAT.  History includes recent former smoker (quit 11/02/16), post-operative nausea, HTN, arthritis, depression, GERD, MI/CAD s/p DES X 2 RCA 08/24/16, hysterectomy, appendectomy 12/10/15. BMI is consistent with morbid obesity.  PCP is Mauricio Po, FNP with [facility].  Cardiologist is Dr. Clarene Critchley with [facility] (see Care Everywhere). Dr. Janace Hoard' office reached out to Dr. Marijo File regarding surgery plans. Dr. Marijo File gave permission to hold Brilinta for 5 days prior to surgery. He did not specifically address the ASA, but per staff at Dr. Janace Hoard' office, patient was instructed to hold both Brilinta and ASA for 5 days. Since patient had DES just over 6 months ago and is off DAPT for surgery I discussed with anesthesiologist Dr. Lissa Hoard. He agreed that Dr. Marijo File needed to be notified that patient was off  Both Brilinta and ASA and see if he felt patient could still undergo surgery as planned. My associate Willeen Cass, FNP-BC called Dr. Charlann Boxer office this afternoon inquiring if patient can still have surgery since both ASA and Brilinta were held. Per his nursing staff Malachy Mood, he replied, "Okay."  Meds include aspirin 81 mg, Lipitor, Wellbutrin SR, Coreg, Zyrtec, clonazepam, losartan, nitroglycerin, Brilinta, Topamax, Nasacort. Per patient, Brilinta and ASA held 5 days prior to procedure.    BP (!) 141/62   Pulse (!) 59   Temp 36.4 C   Resp 18   Ht 5\' 4"  (1.626 m)   Wt 244 lb (110.7 kg)   SpO2 99%   BMI 41.88 kg/m   EKG 08/26/16 ([facility]): SB at 56 bpm, minimal voltage criteria for LVH, may be normal variant.   Cardiac cath 08/24/16 [facility]; Care Everywhere): Conclusions Diagnostic Summary: Normal LMCA. 15% proximal LAD. 10% distal LAD. 5% proximal CX.  95% distal RCA.  Severe stenosis of the RCA. Otherwise non-obstructive coronary artery disease. Normal LV function, LV ejection fraction is 60 %. Interventional Summary Successful PCI / Xience Drug Eluting Stent of the distal Right Coronary Artery. Successful PCI / Xience Drug Eluting Stent of the proximal Right Coronary Artery. Interventional Recommendations Medical therapy for MI, CAD Dual anti-platelet therapy with Ticagrelor and Aspirin 81 mg for at least 6-12 months is recommended.  Preoperative labs noted.   As above, cardiologist Dr. Marijo File aware that patient temporarily off DAPT. No additional recommendations given (see bolded paragraph above). Anesthesiologist to evaluate on the day of surgery.   [facility] Short Stay Center/Anesthesiology Phone [phone] 02/27/2017 4:58 PM

## 2017-02-28 ENCOUNTER — Other Ambulatory Visit: Payer: Self-pay | Admitting: Otolaryngology

## 2017-02-28 ENCOUNTER — Encounter (HOSPITAL_COMMUNITY): Payer: Self-pay | Admitting: *Deleted

## 2017-02-28 ENCOUNTER — Encounter (HOSPITAL_COMMUNITY)
Admission: RE | Disposition: A | Discharge status: Home / Self Care | Payer: Self-pay | Source: Ambulatory Visit | Attending: Cardiology

## 2017-02-28 ENCOUNTER — Observation Stay (HOSPITAL_COMMUNITY)
Admission: RE | Admit: 2017-02-28 | Discharge: 2017-03-01 | Disposition: A | Discharge status: Home / Self Care | Payer: BLUE CROSS/BLUE SHIELD | Source: Ambulatory Visit | Attending: Cardiology | Admitting: Cardiology

## 2017-02-28 DIAGNOSIS — K219 Gastro-esophageal reflux disease without esophagitis: Secondary | ICD-10-CM | POA: Insufficient documentation

## 2017-02-28 DIAGNOSIS — I491 Atrial premature depolarization: Secondary | ICD-10-CM | POA: Diagnosis not present

## 2017-02-28 DIAGNOSIS — I252 Old myocardial infarction: Secondary | ICD-10-CM | POA: Insufficient documentation

## 2017-02-28 DIAGNOSIS — Z7902 Long term (current) use of antithrombotics/antiplatelets: Secondary | ICD-10-CM | POA: Insufficient documentation

## 2017-02-28 DIAGNOSIS — Z87891 Personal history of nicotine dependence: Secondary | ICD-10-CM | POA: Insufficient documentation

## 2017-02-28 DIAGNOSIS — F329 Major depressive disorder, single episode, unspecified: Secondary | ICD-10-CM | POA: Insufficient documentation

## 2017-02-28 DIAGNOSIS — I25118 Atherosclerotic heart disease of native coronary artery with other forms of angina pectoris: Secondary | ICD-10-CM | POA: Diagnosis not present

## 2017-02-28 DIAGNOSIS — Z955 Presence of coronary angioplasty implant and graft: Secondary | ICD-10-CM | POA: Insufficient documentation

## 2017-02-28 DIAGNOSIS — E785 Hyperlipidemia, unspecified: Secondary | ICD-10-CM | POA: Diagnosis not present

## 2017-02-28 DIAGNOSIS — F419 Anxiety disorder, unspecified: Secondary | ICD-10-CM | POA: Diagnosis not present

## 2017-02-28 DIAGNOSIS — Z90710 Acquired absence of both cervix and uterus: Secondary | ICD-10-CM | POA: Diagnosis not present

## 2017-02-28 DIAGNOSIS — R079 Chest pain, unspecified: Secondary | ICD-10-CM | POA: Diagnosis not present

## 2017-02-28 DIAGNOSIS — Z888 Allergy status to other drugs, medicaments and biological substances status: Secondary | ICD-10-CM | POA: Insufficient documentation

## 2017-02-28 DIAGNOSIS — I2510 Atherosclerotic heart disease of native coronary artery without angina pectoris: Secondary | ICD-10-CM | POA: Diagnosis not present

## 2017-02-28 DIAGNOSIS — Z8041 Family history of malignant neoplasm of ovary: Secondary | ICD-10-CM | POA: Insufficient documentation

## 2017-02-28 DIAGNOSIS — Z7982 Long term (current) use of aspirin: Secondary | ICD-10-CM | POA: Insufficient documentation

## 2017-02-28 DIAGNOSIS — Z79899 Other long term (current) drug therapy: Secondary | ICD-10-CM | POA: Diagnosis not present

## 2017-02-28 DIAGNOSIS — I10 Essential (primary) hypertension: Secondary | ICD-10-CM | POA: Diagnosis present

## 2017-02-28 DIAGNOSIS — Z8261 Family history of arthritis: Secondary | ICD-10-CM | POA: Insufficient documentation

## 2017-02-28 DIAGNOSIS — Z8249 Family history of ischemic heart disease and other diseases of the circulatory system: Secondary | ICD-10-CM | POA: Insufficient documentation

## 2017-02-28 HISTORY — DX: ST elevation (STEMI) myocardial infarction involving other coronary artery of inferior wall: I21.19

## 2017-02-28 LAB — TROPONIN I

## 2017-02-28 SURGERY — SINUS SURGERY, ENDOSCOPIC, USING COMPUTER-ASSISTED NAVIGATION

## 2017-02-28 MED ORDER — SODIUM CHLORIDE 0.9% FLUSH
3.0000 mL | Freq: Two times a day (BID) | INTRAVENOUS | Status: DC
  Administered 2017-02-28 – 2017-03-01 (×2): 3 mL via INTRAVENOUS

## 2017-02-28 MED ORDER — ACETAMINOPHEN 500 MG PO TABS: 1000.0000 mg | ORAL_TABLET | Freq: Four times a day (QID) | ORAL | Status: DC | PRN

## 2017-02-28 MED ORDER — ASPIRIN EC 81 MG PO TBEC: 81.0000 mg | DELAYED_RELEASE_TABLET | Freq: Every day | ORAL | Status: DC

## 2017-02-28 MED ORDER — TOPIRAMATE 25 MG PO TABS
50.0000 mg | ORAL_TABLET | Freq: Two times a day (BID) | ORAL | Status: DC
  Administered 2017-02-28: 50 mg via ORAL
  Filled 2017-02-28: qty 2

## 2017-02-28 MED ORDER — LORATADINE 10 MG PO TABS
10.0000 mg | ORAL_TABLET | Freq: Every day | ORAL | Status: DC
  Administered 2017-02-28: 10 mg via ORAL
  Filled 2017-02-28: qty 1

## 2017-02-28 MED ORDER — VITAMIN D 1000 UNITS PO TABS
1000.0000 [IU] | ORAL_TABLET | Freq: Every day | ORAL | Status: DC
  Filled 2017-02-28: qty 1

## 2017-02-28 MED ORDER — LOSARTAN POTASSIUM 50 MG PO TABS
50.0000 mg | ORAL_TABLET | Freq: Every day | ORAL | Status: DC
  Administered 2017-02-28: 50 mg via ORAL
  Filled 2017-02-28: qty 1

## 2017-02-28 MED ORDER — TICAGRELOR 90 MG PO TABS: 90.0000 mg | ORAL_TABLET | Freq: Two times a day (BID) | ORAL | Status: DC

## 2017-02-28 MED ORDER — ASPIRIN 81 MG PO CHEW
324.0000 mg | CHEWABLE_TABLET | ORAL | Status: AC
  Administered 2017-02-28: 324 mg via ORAL
  Filled 2017-02-28: qty 4

## 2017-02-28 MED ORDER — ROCURONIUM BROMIDE 50 MG/5ML IV SOSY
PREFILLED_SYRINGE | INTRAVENOUS | Status: AC
  Filled 2017-02-28: qty 5

## 2017-02-28 MED ORDER — ARTIFICIAL TEARS OP OINT
TOPICAL_OINTMENT | OPHTHALMIC | Status: AC
  Filled 2017-02-28: qty 7

## 2017-02-28 MED ORDER — TICAGRELOR 90 MG PO TABS
180.0000 mg | ORAL_TABLET | Freq: Once | ORAL | Status: AC
  Administered 2017-02-28: 180 mg via ORAL

## 2017-02-28 MED ORDER — SODIUM CHLORIDE 0.9% FLUSH: 3.0000 mL | INTRAVENOUS | Status: DC | PRN

## 2017-02-28 MED ORDER — ONDANSETRON HCL 4 MG/2ML IJ SOLN: 4.0000 mg | Freq: Four times a day (QID) | INTRAMUSCULAR | Status: DC | PRN

## 2017-02-28 MED ORDER — ACETAMINOPHEN 325 MG PO TABS: 650.0000 mg | ORAL_TABLET | ORAL | Status: DC | PRN

## 2017-02-28 MED ORDER — NITROGLYCERIN 0.4 MG SL SUBL: 0.4000 mg | SUBLINGUAL_TABLET | SUBLINGUAL | Status: DC | PRN

## 2017-02-28 MED ORDER — LIDOCAINE 2% (20 MG/ML) 5 ML SYRINGE
INTRAMUSCULAR | Status: AC
  Filled 2017-02-28: qty 5

## 2017-02-28 MED ORDER — CLONAZEPAM 0.5 MG PO TABS
0.2500 mg | ORAL_TABLET | Freq: Two times a day (BID) | ORAL | Status: DC | PRN
  Administered 2017-02-28: 0.25 mg via ORAL
  Filled 2017-02-28: qty 1

## 2017-02-28 MED ORDER — FAMOTIDINE IN NACL 20-0.9 MG/50ML-% IV SOLN
20.0000 mg | INTRAVENOUS | Status: AC
  Administered 2017-02-28: 20 mg via INTRAVENOUS
  Filled 2017-02-28: qty 50

## 2017-02-28 MED ORDER — MIDAZOLAM HCL 2 MG/2ML IJ SOLN
INTRAMUSCULAR | Status: AC
  Filled 2017-02-28: qty 2

## 2017-02-28 MED ORDER — PROPOFOL 10 MG/ML IV BOLUS
INTRAVENOUS | Status: AC
  Filled 2017-02-28: qty 20

## 2017-02-28 MED ORDER — SUGAMMADEX SODIUM 500 MG/5ML IV SOLN
INTRAVENOUS | Status: AC
  Filled 2017-02-28: qty 5

## 2017-02-28 MED ORDER — LACTATED RINGERS IV SOLN
INTRAVENOUS | Status: DC | PRN
  Administered 2017-02-28: 14:00:00 via INTRAVENOUS

## 2017-02-28 MED ORDER — CARVEDILOL 3.125 MG PO TABS
3.1250 mg | ORAL_TABLET | Freq: Two times a day (BID) | ORAL | Status: DC
  Administered 2017-02-28: 3.125 mg via ORAL
  Filled 2017-02-28: qty 1

## 2017-02-28 MED ORDER — ONDANSETRON HCL 4 MG/2ML IJ SOLN
INTRAMUSCULAR | Status: AC
  Filled 2017-02-28: qty 2

## 2017-02-28 MED ORDER — LACTATED RINGERS IV SOLN
INTRAVENOUS | Status: DC
  Administered 2017-02-28: 14:00:00 via INTRAVENOUS

## 2017-02-28 MED ORDER — CARVEDILOL 3.125 MG PO TABS: 3.1250 mg | ORAL_TABLET | Freq: Two times a day (BID) | ORAL | Status: DC

## 2017-02-28 MED ORDER — SALINE SPRAY 0.65 % NA SOLN
1.0000 | NASAL | Status: DC | PRN
  Filled 2017-02-28 (×2): qty 44

## 2017-02-28 MED ORDER — SODIUM CHLORIDE 0.9 % IV SOLN: 250.0000 mL | INTRAVENOUS | Status: DC | PRN

## 2017-02-28 MED ORDER — PANTOPRAZOLE SODIUM 40 MG PO TBEC
40.0000 mg | DELAYED_RELEASE_TABLET | Freq: Every day | ORAL | Status: DC
  Administered 2017-03-01: 40 mg via ORAL
  Filled 2017-02-28: qty 1

## 2017-02-28 MED ORDER — CARVEDILOL 3.125 MG PO TABS
ORAL_TABLET | ORAL | Status: AC
  Filled 2017-02-28: qty 1

## 2017-02-28 MED ORDER — ATORVASTATIN CALCIUM 40 MG PO TABS
40.0000 mg | ORAL_TABLET | Freq: Every day | ORAL | Status: DC
  Administered 2017-02-28: 40 mg via ORAL
  Filled 2017-02-28: qty 1

## 2017-02-28 MED ORDER — FENTANYL CITRATE (PF) 250 MCG/5ML IJ SOLN
INTRAMUSCULAR | Status: AC
  Filled 2017-02-28: qty 5

## 2017-02-28 MED ORDER — ASPIRIN 300 MG RE SUPP
300.0000 mg | RECTAL | Status: AC
  Filled 2017-02-28: qty 1

## 2017-02-28 MED ORDER — BUPROPION HCL ER (SR) 150 MG PO TB12
150.0000 mg | ORAL_TABLET | Freq: Two times a day (BID) | ORAL | Status: DC
  Administered 2017-02-28: 150 mg via ORAL
  Filled 2017-02-28: qty 1

## 2017-02-28 NOTE — H&P (Signed)
History and Physical   Patient ID: Theresa Gibson MRN: [id number], DOB/AGE: 02-15-1956 61 y.o. Date of Encounter: 02/28/2017  Primary Physician: Mauricio Po, [place] Primary Cardiologist: Dr Marijo File  Chief Complaint:  Chest pain   HPI: Theresa Gibson is a 61 y.o. female with a history of Inferior STEMI 08/24/2016. She had no previous cardiac issues. She also has a history of hypertension but was not taking medications at that time. She was taken to [facility] and had drug-eluting stent to her RCA. She was discharged on aspirin and Brilinta among other medications.  Theresa Gibson has a long running problem with her sinuses. She has had multiple sinus infections. She saw Dr. Janace Hoard and had decided to have Theresa surgery. As part of her surgery, she will need to stop her antiplatelet agents. Dr. Marco Collie was contacted and gave permission for her to be off these. She had been off her aspirin and Plavix for 5 days when she came to Theresa hospital today.  Since her MI, she has had only one episode of "indigestion". In Theresa H&P from [facility], she describes an elephant sitting on her chest. This was associated with shortness of breath and choking feeling.   She states today that Theresa chest pain that she had with her MI felt like indigestion. Since her MI, she has had 2 episodes of indigestion, and one of those was today. Theresa first episode was in Theresa context of her being in a hurry " running across Theresa kitchen". She stopped what she was doing, took a Tums and rested. Theresa symptoms resolved. She has not been exercising regularly, but has had no other episodes of exertional chest pain.  Today, she was in Theresa hospital awaiting surgery. She had had nothing by mouth. She was feeling anxious about Theresa surgery. She had sudden onset of "indigestion". It was a 3-4/10. It was not associated with shortness of breath, nausea, vomiting, or diaphoresis. Theresa pain lasted about 15 minutes, and resolved without  intervention. She feels Theresa pain is like her heart attack pain but not nearly as severe and there are no associated symptoms. She is currently pain free.   Past Medical History:  Diagnosis Date  . Acute MI, inferior wall ([place]) 08/2016   s/p DES x 2 to Theresa RCA  . Allergy   . Arthritis   . Depression   . GERD (gastroesophageal reflux disease)   . Hypertension   . Meningioma ([place])   . PONV (postoperative nausea and vomiting)    nausea only  . Wears dentures    upper    Surgical History:  Past Surgical History:  Procedure Laterality Date  . ABDOMINAL HYSTERECTOMY     Total   . BREAST SURGERY     BX right breast  . COLONOSCOPY W/ BIOPSIES AND POLYPECTOMY    . CORONARY ANGIOPLASTY WITH STENT PLACEMENT    . KNEE SURGERY Bilateral    arthroscopy  . LAPAROSCOPIC APPENDECTOMY N/A 12/10/2015   Procedure: APPENDECTOMY LAPAROSCOPIC;  Surgeon: Erroll Luna, MD;  Location: [facility];  Service: General;  Laterality: N/A;  . MULTIPLE TOOTH EXTRACTIONS    . TUBAL LIGATION       I have reviewed Theresa patient's current medications. Prior to Admission medications   Medication Sig Start Date End Date Taking? Authorizing Provider  aspirin EC 81 MG tablet Take 81 mg by mouth daily.   Yes Historical Provider, MD  atorvastatin (LIPITOR) 40 MG tablet Take 40  mg by mouth daily at 6 PM.    Yes Historical Provider, MD  buPROPion (WELLBUTRIN SR) 150 MG 12 hr tablet Take 1 tablet by mouth daily for 3 days then increase to 1 tablet by mouth daily. Patient taking differently: Take 150 mg by mouth 2 (two) times daily.  02/22/17  Yes Golden Circle, FNP  carvedilol (COREG) 3.125 MG tablet Take 3.125 mg by mouth 2 (two) times daily with a meal.   Yes Historical Provider, MD  cetirizine (ZYRTEC) 10 MG tablet Take 10 mg by mouth daily as needed for allergies.   Yes Historical Provider, MD  cholecalciferol (VITAMIN D) 1000 units tablet Take 1,000 Units by mouth daily.   Yes Historical Provider, MD  clonazePAM  (KLONOPIN) 0.5 MG tablet Take 0.5-1 tablets (0.25-0.5 mg total) by mouth 2 (two) times daily as needed for anxiety. 11/19/16  Yes Golden Circle, FNP  losartan (COZAAR) 50 MG tablet Take 50 mg by mouth daily.   Yes Historical Provider, MD  nitroGLYCERIN (NITROSTAT) 0.4 MG SL tablet Place 0.4 mg under Theresa tongue every 5 (five) minutes as needed for chest pain.   Yes Historical Provider, MD  ticagrelor (BRILINTA) 90 MG TABS tablet Take 90 mg by mouth 2 (two) times daily.   Yes Historical Provider, MD  topiramate (TOPAMAX) 50 MG tablet Take 50 mg by mouth 2 (two) times daily.   Yes Historical Provider, MD  triamcinolone (NASACORT) 55 MCG/ACT AERO nasal inhaler Place 2 sprays into Theresa nose daily. 01/04/17  Yes John Molpus, MD  acetaminophen (TYLENOL) 500 MG tablet Take 1,000 mg by mouth every 6 (six) hours as needed for moderate pain.    Historical Provider, MD   Scheduled Meds: . aspirin EC  81 mg Oral Daily  . atorvastatin  40 mg Oral q1800  . buPROPion  150 mg Oral BID  . carvedilol      . carvedilol  3.125 mg Oral BID WC  . carvedilol  3.125 mg Oral BID WC  . cholecalciferol  1,000 Units Oral Daily  . loratadine  10 mg Oral Daily  . losartan  50 mg Oral Daily  . ticagrelor  180 mg Oral Once  . [START ON 03/01/2017] ticagrelor  90 mg Oral BID  . topiramate  50 mg Oral BID   Continuous Infusions: . lactated ringers 50 mL/hr at 02/28/17 1338   PRN Meds:.  Allergies:  Allergies  Allergen Reactions  . Nicotine     Patch...caused a rash  . Ace Inhibitors Cough    REACTION: Dry cough-lisinopril     Social History   Social History  . Marital status: Divorced    Spouse name: N/A  . Number of children: N/A  . Years of education: 66   Occupational History  . 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 Smoking status: Former Smoker    Packs/day: 0.50    Years: 42.00    Types: Cigarettes    Quit date: 11/02/2016  . Smokeless tobacco: Never Used  . Alcohol use No  .  Drug use: No  . Sexual activity: Not on file   Other Topics Concern  . Not on file   Social History Narrative   Born and raised in [place], [place]. 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 2 children (daughter 51) (daughter 54)   Plays with grandchildren   Recently started exercising with personal trainer   Started eating better    Family History  Problem  Relation Age of Onset  . Hypertension Mother   . Arthritis Mother   . Heart attack Father     In his 37's  . Ovarian cancer Maternal Grandmother   . Colon cancer Neg Hx   . Colon polyps Neg Hx   . Rectal cancer Neg Hx   . Stomach cancer Neg Hx    Family Status  Relation Status  . Mother Alive  . Father Deceased  . Maternal Grandmother   . Neg Hx     Review of Systems:   Full 14-point review of systems otherwise negative except as noted above.  Physical Exam: Blood pressure 134/81, pulse 61, temperature 97.9 F (36.6 C), temperature source Oral, resp. rate 18, height '5\' 4"'  (1.626 m), weight 244 lb (110.7 kg), SpO2 97 %. General: Well developed, well nourished,female in no acute distress. Head: Normocephalic, atraumatic, sclera non-icteric, no xanthomas, nares are without discharge. Dentition: Good Neck: No carotid bruits. JVD not elevated. No thyromegally Lungs: Good expansion bilaterally. without wheezes or rhonchi.  Heart: Regular rate and rhythm with S1 S2.  No S3 or S4.  Very soft murmur at Theresa right upper sternal border, no rubs, or gallops appreciated. Abdomen: Soft, non-tender, non-distended with normoactive bowel sounds. No hepatomegaly. No rebound/guarding. No obvious abdominal masses. Msk:  Strength and tone appear normal for age. No joint deformities or effusions, no spine or costo-vertebral angle tenderness. Extremities: No clubbing or cyanosis. No edema.  Distal pedal pulses are 2+ in 4 extrem Neuro: Alert and oriented X 3. Moves all extremities spontaneously. No focal deficits noted. Psych:   Responds to questions appropriately with a normal affect. Skin: No rashes or lesions noted  Labs:   Lab Results  Component Value Date   WBC 8.9 02/26/2017   HGB 12.9 02/26/2017   HCT 38.9 02/26/2017   MCV 85.3 02/26/2017   PLT 239 02/26/2017    Recent Labs  02/26/17 0857  INR 1.06     Recent Labs Lab 02/26/17 0857  NA 140  K 3.8  CL 112*  CO2 20*  BUN 12  CREATININE 0.87  CALCIUM 9.2  GLUCOSE 144*    Radiology/Studies: No results found.   Cardiac Cath:08/24/2016 Cath report in Care Everywhere Angiographic Findings  Cardiac Arteries and Lesion Findings LMCA: Normal appearance with 0% stenosis. LAD: Lesion on Prox LAD: 15% stenosis 14 mm length . Lesion on Dist LAD: 10% stenosis 13 mm length . LCx: Lesion on Prox CX: Ostial.5% stenosis 6 mm length . RCA: Lesion on Dist RCA: Distal subsection.95% stenosis 16 mm length reduced to 0%. Pre procedure TIMI II flow was noted. Post Procedure TIMI III flow was present. Good run off was present. Theresa lesion was diagnosed as High Risk(C). Devices used - Abbott 0.014" x 190cm BMW J-Tip PTCI Guidewire. Number of passes: 1. - 2.5 mm X 15 mm Trek RX. 1 inflation(s) to a max pressure of: 8 atm. - Xience Alpine 2.5x7m Everolimus DES . (Primary Device)Length: 18 mm. 2 inflation(s) to a max pressure of: 11 atm. Lesion on Prox RCA: Proximal subsection.70% stenosis 10 mm length reduced to 0%. Pre procedure TIMI III flow was noted. Post Procedure TIMI III flow was present. Good run off was present. Theresa lesion was diagnosed as Low Risk(A). Devices used - Xience Alpine 3.0x141mEverolimus DES. (Primary Device)Length: 12 mm. 2 inflation(s) to a max pressure of: 12 atm. Lesion on R PDA: 30% stenosis 10 mm length .Theresa lesion was diffuse.Theresa lesion showed with irregular contour. LV  function assessed HT:DSKAJG. Ejection Fraction - Method: Estimated. EF%: 60.  Echo: N/A  ECG:  02/28/2017 Sinus rhythm with PACs, LVH, no acute ischemic changes and no Q waves  ASSESSMENT AND PLAN:  Principal Problem:   Chest pain, moderate coronary artery risk - She has had 2 episodes since her MI, one with physical exertion and one with emotional exertion. - Theresa timing of Theresa first one is unclear, but Theresa second one occurred 5 days after stopping all of her antiplatelet agents. - At Theresa time of her MI, she had single vessel disease but required 2 drug-eluting stents. Her EF was normal. - This may indeed be indigestion, but it would behoove [place] to make sure. - We will admit her overnight for observation and cycle cardiac enzymes. - Go ahead and reload her with Brilinta and restart aspirin - If enzymes are negative, M.D. advise on deferring further workup to [place] versus doing a Myoview in house. - If enzymes become elevated, she will need heparin and nitrates, and plan cath for tomorrow.  Active Problems:   Hyperlipidemia with target LDL less than 70 - She is on high-dose statin, continue this  - check profile in Theresa a.m.    Essential hypertension - BP is well-controlled now, follow overnight   Signed, Lenoard Aden 02/28/2017 5:39 PM Beeper [phone]  I have seen, examined and evaluated Theresa patient this PM along with Rosaria Ferries, PA-C.  After reviewing all Theresa available data and chart, we discussed Theresa Gibson laboratory, study & physical findings as well as symptoms in detail. I agree with her findings, examination as well as impression recommendations as per our discussion.    Difficult situation with Theresa patient who had an inferior STEMI about 6 years ago requiring 2 DES stents to Theresa RCA. She was cleared to stop Brilinta for her planned nasal surgery today. However, since stopping her Brilinta that she has had 2 episodes of indigestion type chest discomfort similar to her MI related anginal pain. One episode was while rushing, and Theresa other was today  somewhat stressed about surgery. Theresa extent of discomfort was not nearly as severe as it was for her MI, but it was similar. Theresa associated dyspnea and fatigue was not there over. Symptom lasted about 15 minutes and is now resolved. Her exam is relatively benign, she is currently comfortable with no issues. Theresa major concern now is how do we safely clear her for surgery. My general impression is that this is probably not angina, and that she could safely proceed with surgery, however 6 months out from 2 DES stents and now having similar symptoms is somewhat concerning. We talked for a while and I think Theresa best course of action would be to rule her out for MI. She at least 15 minutes of pain. She will be admitted under observation status tonight and have her enzymes checked. We will continue her current home medications and reload her with Brilinta simply because of concern for possible in-stent thrombosis. Theresa next step would be to discuss with her cardiologist tomorrow what his thoughts are, but my recommendation would be in order to feel more comfortable proceeding with surgery in Theresa near future, to check a stress test to evaluate for any potential inferolateral ischemia. She had preserved EF post MI on LV gram, therefore I would imagine that she would not have a huge inferior infarct on stress test. Given her body habitus, there is potential for breast attenuation mediated anterior defect, however our  main concern will be to determine if there is any inferior ischemia as she has had a recent catheterization showing pretty normal left coronary artery branches.  Plan: obs admission - r/o MI with cardiac markers Restart Brilinta Ambulate in AM to assess for Sx --> tentatively plan for Myoview in AM, but will discuss with her primary cardiologist If Trop levels increase - would need cath.    [name], M.D., M.S. Interventional Cardiologist   Pager # [phone] Phone # [phone] [address] [place], [place] [address]

## 2017-02-28 NOTE — Progress Notes (Signed)
Called to room by patient stating she was having acid reflux. Denied nausea or any other pain. Anesthesiologist notified. Orders received and carried out.

## 2017-02-28 NOTE — H&P (Signed)
Theresa Gibson is an 61 y.o. female.   Chief Complaint: Chronic sinusitis HPI: History of chronic sinus problems and nasal obstruction. She has been very frustrated and and had maximum medical therapy without resolution. She had a stent placed in October and now is allowed to come off her blood thinners and she is ready to proceed with the sinus treatment.   Past Medical History:  Diagnosis Date  . Allergy   . Arthritis   . Coronary artery disease   . Depression   . GERD (gastroesophageal reflux disease)   . Hypertension   . Meningioma ([place])   . Myocardial infarct   . PONV (postoperative nausea and vomiting)    nausea only  . Wears dentures    upper    Past Surgical History:  Procedure Laterality Date  . ABDOMINAL HYSTERECTOMY     Total   . BREAST SURGERY     BX right breast  . COLONOSCOPY W/ BIOPSIES AND POLYPECTOMY    . CORONARY ANGIOPLASTY WITH STENT PLACEMENT    . KNEE SURGERY Bilateral    arthroscopy  . LAPAROSCOPIC APPENDECTOMY N/A 12/10/2015   Procedure: APPENDECTOMY LAPAROSCOPIC;  Surgeon: Erroll Luna, MD;  Location: [facility];  Service: General;  Laterality: N/A;  . MULTIPLE TOOTH EXTRACTIONS    . TUBAL LIGATION      Family History  Problem Relation Age of Onset  . Hypertension Mother   . Arthritis Mother   . Heart attack Father     In his 29's  . Ovarian cancer Maternal Grandmother   . Colon cancer Neg Hx   . Colon polyps Neg Hx   . Rectal cancer Neg Hx   . Stomach cancer Neg Hx    Social History:  reports that she quit smoking about 3 months ago. Her smoking use included Cigarettes. She has a 21.00 pack-year smoking history. She has never used smokeless tobacco. She reports that she does not drink alcohol or use drugs.  Allergies:  Allergies  Allergen Reactions  . Nicotine     Patch...caused a rash  . Ace Inhibitors Cough    REACTION: Dry cough-lisinopril     No prescriptions prior to admission.    No results found for this or any previous visit  (from the past 48 hour(s)). No results found.  Review of Systems  Constitutional: Negative.   HENT: Negative.   Eyes: Negative.   Respiratory: Negative.   Cardiovascular: Negative.   Skin: Negative.     There were no vitals taken for this visit. Physical Exam  Constitutional: She appears well-developed and well-nourished.  HENT:  Head: Normocephalic and atraumatic.  Mouth/Throat: Oropharynx is clear and moist.  Eyes: Conjunctivae are normal. Pupils are equal, round, and reactive to light.  Neck: Normal range of motion. Neck supple.  Cardiovascular: Normal rate.   Respiratory: Effort normal.  GI: Soft.  Musculoskeletal: Normal range of motion.     Assessment/Plan Chronic sinusitis-we discussed her procedure with endoscopic sinus surgery and turbinate reduction. She is now ready to proceed.  Melissa Montane, MD 02/28/2017, 12:08 PM

## 2017-02-28 NOTE — Progress Notes (Signed)
Called report to [facility], RN on [facility]. Verbalized understanding. Pt transported to floor with RN at bedside.

## 2017-02-28 NOTE — Progress Notes (Signed)
Pt has arrived from the Short stay area

## 2017-02-28 NOTE — Progress Notes (Signed)
Called to room by patient's family stating patient was now having chest pain. EKG order obtained and carried out. Result taken to Anesthesiologist for review.

## 2017-03-01 ENCOUNTER — Observation Stay (HOSPITAL_BASED_OUTPATIENT_CLINIC_OR_DEPARTMENT_OTHER): Payer: BLUE CROSS/BLUE SHIELD

## 2017-03-01 DIAGNOSIS — I2510 Atherosclerotic heart disease of native coronary artery without angina pectoris: Secondary | ICD-10-CM

## 2017-03-01 DIAGNOSIS — Z7982 Long term (current) use of aspirin: Secondary | ICD-10-CM | POA: Diagnosis not present

## 2017-03-01 DIAGNOSIS — Z888 Allergy status to other drugs, medicaments and biological substances status: Secondary | ICD-10-CM | POA: Diagnosis not present

## 2017-03-01 DIAGNOSIS — Z87891 Personal history of nicotine dependence: Secondary | ICD-10-CM | POA: Diagnosis not present

## 2017-03-01 DIAGNOSIS — I491 Atrial premature depolarization: Secondary | ICD-10-CM | POA: Diagnosis not present

## 2017-03-01 DIAGNOSIS — R079 Chest pain, unspecified: Secondary | ICD-10-CM | POA: Diagnosis not present

## 2017-03-01 DIAGNOSIS — Z8261 Family history of arthritis: Secondary | ICD-10-CM | POA: Diagnosis not present

## 2017-03-01 DIAGNOSIS — F419 Anxiety disorder, unspecified: Secondary | ICD-10-CM | POA: Diagnosis not present

## 2017-03-01 DIAGNOSIS — I252 Old myocardial infarction: Secondary | ICD-10-CM | POA: Diagnosis not present

## 2017-03-01 DIAGNOSIS — K219 Gastro-esophageal reflux disease without esophagitis: Secondary | ICD-10-CM | POA: Diagnosis not present

## 2017-03-01 DIAGNOSIS — E785 Hyperlipidemia, unspecified: Secondary | ICD-10-CM | POA: Diagnosis not present

## 2017-03-01 DIAGNOSIS — Z9861 Coronary angioplasty status: Secondary | ICD-10-CM

## 2017-03-01 DIAGNOSIS — I10 Essential (primary) hypertension: Secondary | ICD-10-CM | POA: Diagnosis not present

## 2017-03-01 DIAGNOSIS — Z7902 Long term (current) use of antithrombotics/antiplatelets: Secondary | ICD-10-CM | POA: Diagnosis not present

## 2017-03-01 DIAGNOSIS — Z79899 Other long term (current) drug therapy: Secondary | ICD-10-CM | POA: Diagnosis not present

## 2017-03-01 DIAGNOSIS — Z90710 Acquired absence of both cervix and uterus: Secondary | ICD-10-CM | POA: Diagnosis not present

## 2017-03-01 DIAGNOSIS — Z955 Presence of coronary angioplasty implant and graft: Secondary | ICD-10-CM | POA: Diagnosis not present

## 2017-03-01 DIAGNOSIS — F329 Major depressive disorder, single episode, unspecified: Secondary | ICD-10-CM | POA: Diagnosis not present

## 2017-03-01 LAB — TROPONIN I: Troponin I: <0.03 ng/mL (ref <0.03)

## 2017-03-01 LAB — NM MYOCAR MULTI W/SPECT W/WALL MOTION / EF
CSEPEDS: 14 s
CSEPEW: 1 METS
CSEPPHR: 98 {beats}/min
Exercise duration (min): 5 min
Rest HR: 69 {beats}/min

## 2017-03-01 MED ORDER — TECHNETIUM TC 99M TETROFOSMIN IV KIT
10.0000 | PACK | Freq: Once | INTRAVENOUS | Status: AC | PRN
  Administered 2017-03-01: 10 via INTRAVENOUS

## 2017-03-01 MED ORDER — TECHNETIUM TC 99M TETROFOSMIN IV KIT
30.0000 | PACK | Freq: Once | INTRAVENOUS | Status: AC | PRN
  Administered 2017-03-01: 30 via INTRAVENOUS

## 2017-03-01 MED ORDER — TECHNETIUM TC 99M TETROFOSMIN IV KIT: 30.0000 | PACK | Freq: Once | INTRAVENOUS | Status: DC | PRN

## 2017-03-01 MED ORDER — REGADENOSON 0.4 MG/5ML IV SOLN
INTRAVENOUS | Status: AC
  Administered 2017-03-01: 0.4 mg via INTRAVENOUS
  Filled 2017-03-01: qty 5

## 2017-03-01 MED ORDER — REGADENOSON 0.4 MG/5ML IV SOLN
0.4000 mg | Freq: Once | INTRAVENOUS | Status: AC
  Administered 2017-03-01: 0.4 mg via INTRAVENOUS
  Filled 2017-03-01: qty 5

## 2017-03-01 MED ORDER — PANTOPRAZOLE SODIUM 40 MG PO TBEC: 40.0000 mg | DELAYED_RELEASE_TABLET | Freq: Every day | ORAL | 3 refills | Status: DC

## 2017-03-01 NOTE — Progress Notes (Signed)
Pt has orders to be discharged. Discharge instructions given and pt has no additional questions at this time. Medication regimen reviewed and pt educated. Pt verbalized understanding and has no additional questions. Telemetry box removed. IV removed and site in good condition. Pt stable and waiting for transportation.   Flavio Lindroth RN 

## 2017-03-01 NOTE — Progress Notes (Signed)
Progress Note  Patient Name: Theresa Gibson Date of Encounter: 03/01/2017  Primary Cardiologist: Dr Marijo File  Subjective   Seen in nuc med. No chest pain or dyspnea.   Inpatient Medications    Scheduled Meds: . aspirin EC  81 mg Oral Daily  . atorvastatin  40 mg Oral q1800  . buPROPion  150 mg Oral BID  . carvedilol  3.125 mg Oral BID WC  . cholecalciferol  1,000 Units Oral Daily  . loratadine  10 mg Oral Daily  . losartan  50 mg Oral Daily  . pantoprazole  40 mg Oral Q0600  . sodium chloride flush  3 mL Intravenous Q12H  . ticagrelor  90 mg Oral BID  . topiramate  50 mg Oral BID   Continuous Infusions: . lactated ringers 50 mL/hr at 02/28/17 1338   PRN Meds: sodium chloride, acetaminophen, acetaminophen, clonazePAM, nitroGLYCERIN, ondansetron (ZOFRAN) IV, sodium chloride, sodium chloride flush   Vital Signs    Vitals:   03/01/17 1124 03/01/17 1137 03/01/17 1139 03/01/17 1141  BP: (!) 155/91 (!) 161/79 (!) 142/81 (!) 144/85  Pulse:      Resp:      Temp:      TempSrc:      SpO2:      Weight:      Height:        Intake/Output Summary (Last 24 hours) at 03/01/17 1142 Last data filed at 03/01/17 0920  Gross per 24 hour  Intake              740 ml  Output             1100 ml  Net             -360 ml   Filed Weights   02/28/17 1303 02/28/17 1808 03/01/17 0453  Weight: 244 lb (110.7 kg) 240 lb 3.2 oz (109 kg) 241 lb 14.4 oz (109.7 kg)    Telemetry    Unable to Personally Review as patient seen in nuc med  ECG   NSR with PACs - Personally Reviewed  Physical Exam   GEN: No acute distress.   Neck: No JVD Cardiac: RRR, no murmurs, rubs, or gallops.  Respiratory: Clear to auscultation bilaterally. GI: Soft, nontender, non-distended  MS: No edema; No deformity. Neuro:  Nonfocal  Psych: Normal affect   Labs    Chemistry Recent Labs Lab 02/26/17 0857  NA 140  K 3.8  CL 112*  CO2 20*  GLUCOSE 144*  BUN 12  CREATININE 0.87  CALCIUM 9.2    GFRNONAA >60  GFRAA >60  ANIONGAP 8     Hematology Recent Labs Lab 02/26/17 0857  WBC 8.9  RBC 4.56  HGB 12.9  HCT 38.9  MCV 85.3  MCH 28.3  MCHC 33.2  RDW 15.1  PLT 239    Cardiac Enzymes Recent Labs Lab 02/28/17 2123 03/01/17 0326 03/01/17 0934  TROPONINI <0.03 <0.03 <0.03   No results for input(s): TROPIPOC in the last 168 hours.   BNPNo results for input(s): BNP, PROBNP in the last 168 hours.   DDimer No results for input(s): DDIMER in the last 168 hours.   Radiology    No results found.  Cardiac Studies   Pending nuc  Patient Profile     61 y.o. female with Hx of HTN, HLD and inferior STEMI about 6 years ago requiring 2 DES stents to the RCA presents with chest pain.   Assessment & Plan  1. Chest pain - Troponin negative. EKG without acute finding. No chest pain. Pending nuc med.  - Continue ASA, Brillinta, statin, BB and ARB.   2. HTN - Stable Signed, Bhagat,Bhavinkumar, PA  03/01/2017, 11:42 AM    I saw evaluated the patient 1 stress test was complete. I now have reviewed the results of the stress test which reveal A low RISK study with no reversible ischemia. EF 62%. No infarct or ischemia noted.  He has not had any further chest pain and ruled out for MI. She is back on her current medications. I think at this time she is okay for discharge to follow-up with her primary cardiologist in [facility] I would defer to him as to when he would stop Brilinta safely to repeat attempt her nasal surgery. Relieved that having stopped it for a week, there was no evidence to suggest ischemic thrombosis in her RCA stents.  Okay for discharge today.  [name], M.D., M.S. Interventional Cardiologist   Pager # [phone] Phone # [phone] [address] [place], [place] [address]

## 2017-03-01 NOTE — Discharge Summary (Signed)
Discharge Summary    Patient ID: Theresa Gibson,  MRN: [id number], DOB/AGE: December 11, 1955 61 y.o.  Admit date: 02/28/2017 Discharge date: 03/01/2017  Primary Care Provider: Mauricio Po Primary Cardiologist: Dr Marijo File  Discharge Diagnoses    Principal Problem:   Chest pain, moderate coronary artery risk Active Problems:   Hyperlipidemia with target LDL less than 70   Essential hypertension   Chest pain   Allergies Allergies  Allergen Reactions  . Nicotine     Patch...caused a rash  . Ace Inhibitors Cough    REACTION: Dry cough-lisinopril     Diagnostic Studies/Procedures    Myoview 03/01/17 IMPRESSION: 1. No reversible ischemia. Apical thinning noted but no definite scar identified.  2. Normal left ventricular wall motion.  3. Left ventricular ejection fraction 62%  4. Non invasive risk stratification*: Low    History of Present Illness     Theresa Gibson is a 61 y.o. female with a  Hx of HTN, HLD and inferior STEMI about 6 years ago requiring 2 DES stents to the RCA presents with chest pain.    History of Inferior STEMI 08/24/2016. She had no previous cardiac issues. She also has a history of hypertension but was not taking medications at that time. She was taken to the Cath Lab and had drug-eluting stent to her RCA. She was discharged on aspirin and Brilinta among other medications.  [name] has a long running problem with her sinuses. She has had multiple sinus infections. She saw Dr. Janace Hoard and had decided to have the surgery. As part of her surgery, she will need to stop her antiplatelet agents. Dr. Marco Collie was contacted and gave permission for her to be off these. She had been off her aspirin and Plavix for 5 days when she came to the hospital today.  she was in the hospital awaiting surgery. She had had nothing by mouth. She was feeling anxious about the surgery. She had sudden onset of "indigestion". It was a 3-4/10. It was not associated  with shortness of breath, nausea, vomiting, or diaphoresis. The pain lasted about 15 minutes, and resolved without intervention. She feels the pain is like her heart attack pain but not nearly as severe and there are no associated symptoms.    Hospital Course     Consultants: None  She was admitted and started on PPI. Continued her current home medications and reload her with Brilinta simply because of concern for possible in-stent thrombosis. Troponin negative x 3. EKG without acute finding. No further chest pain. Myoview low risk. No reversible ischemia. Continued ASA and Brillinta. She will follow up with her primary cardiologist in 1-2 weeks who will determine when to stop Brilinta safely to repeat attempt her nasal surgery. Relieved that having stopped it for a week, there was no evidence to suggest ischemic thrombosis in her RCA stents.  The patient has been seen by Dr.Harding  today and deemed ready for discharge home. All follow-up appointments have been scheduled. Discharge medications are listed below.  _____________   Discharge Vitals Blood pressure (!) 144/85, pulse 60, temperature 97.8 F (36.6 C), temperature source Oral, resp. rate 18, height 5\' 4"  (1.626 m), weight 241 lb 14.4 oz (109.7 kg), SpO2 99 %.  Filed Weights   02/28/17 1303 02/28/17 1808 03/01/17 0453  Weight: 244 lb (110.7 kg) 240 lb 3.2 oz (109 kg) 241 lb 14.4 oz (109.7 kg)    Labs & Radiologic Studies     CBC No  results for input(s): WBC, NEUTROABS, HGB, HCT, MCV, PLT in the last 72 hours. Basic Metabolic Panel No results for input(s): NA, K, CL, CO2, GLUCOSE, BUN, CREATININE, CALCIUM, MG, PHOS in the last 72 hours. Liver Function Tests No results for input(s): AST, ALT, ALKPHOS, BILITOT, PROT, ALBUMIN in the last 72 hours. No results for input(s): LIPASE, AMYLASE in the last 72 hours. Cardiac Enzymes  Recent Labs  02/28/17 2123 03/01/17 0326 03/01/17 0934  TROPONINI <0.03 <0.03 <0.03   BNP Invalid  input(s): POCBNP D-Dimer No results for input(s): DDIMER in the last 72 hours. Hemoglobin A1C No results for input(s): HGBA1C in the last 72 hours. Fasting Lipid Panel No results for input(s): CHOL, HDL, LDLCALC, TRIG, CHOLHDL, LDLDIRECT in the last 72 hours. Thyroid Function Tests No results for input(s): TSH, T4TOTAL, T3FREE, THYROIDAB in the last 72 hours.  Invalid input(s): FREET3  Nm Myocar Multi W/spect W/wall Motion / Ef  Result Date: 03/01/2017 CLINICAL DATA:  Myocardial infarction in September 2017. Stent placement. Hypertension. Reflux. EXAM: MYOCARDIAL IMAGING WITH SPECT (REST AND PHARMACOLOGIC-STRESS) GATED LEFT VENTRICULAR WALL MOTION STUDY LEFT VENTRICULAR EJECTION FRACTION TECHNIQUE: Standard myocardial SPECT imaging was performed after resting intravenous injection of 10 mCi Tc-41m tetrofosmin. Subsequently, intravenous infusion of Lexiscan was performed under the supervision of the Cardiology staff. At peak effect of the drug, 30 mCi Tc-57m tetrofosmin was injected intravenously and standard myocardial SPECT imaging was performed. Quantitative gated imaging was also performed to evaluate left ventricular wall motion, and estimate left ventricular ejection fraction. COMPARISON:  None. FINDINGS: Perfusion: Mild apical thinning and matched on stress and rest images. No significant defect is observed in the inferior wall or elsewhere. No inducible ischemia. Wall Motion: Normal left ventricular wall motion. No left ventricular dilation. Left Ventricular Ejection Fraction: 62 % End diastolic volume 75 ml End systolic volume 29 ml IMPRESSION: 1. No reversible ischemia. Apical thinning noted but no definite scar identified. 2. Normal left ventricular wall motion. 3. Left ventricular ejection fraction 62% 4. Non invasive risk stratification*: Low *2012 Appropriate Use Criteria for Coronary Revascularization Focused Update: J Am Coll Cardiol. 7169;67(8):938-101.  [email or web address] Electronically Signed   By: Van Clines M.D.   On: 03/01/2017 13:22    Disposition   Pt is being discharged home today in good condition.  Follow-up Plans & Appointments    Follow-up Information    Dr Marijo File. Schedule an appointment as soon as possible for a visit in 1 week(s).          Discharge Instructions    Diet - low sodium heart healthy    Complete by:  As directed    Increase activity slowly    Complete by:  As directed       Discharge Medications   Current Discharge Medication List    START taking these medications   Details  pantoprazole (PROTONIX) 40 MG tablet Take 1 tablet (40 mg total) by mouth daily at 6 (six) AM. Qty: 30 tablet, Refills: 3      CONTINUE these medications which have NOT CHANGED   Details  aspirin EC 81 MG tablet Take 81 mg by mouth daily.    atorvastatin (LIPITOR) 40 MG tablet Take 40 mg by mouth daily at 6 PM.     buPROPion (WELLBUTRIN SR) 150 MG 12 hr tablet Take 1 tablet by mouth daily for 3 days then increase to 1 tablet by mouth daily. Qty: 60 tablet, Refills: 0    carvedilol (COREG) 3.125 MG tablet Take 3.125 mg  by mouth 2 (two) times daily with a meal.    cetirizine (ZYRTEC) 10 MG tablet Take 10 mg by mouth daily as needed for allergies.    cholecalciferol (VITAMIN D) 1000 units tablet Take 1,000 Units by mouth daily.    clonazePAM (KLONOPIN) 0.5 MG tablet Take 0.5-1 tablets (0.25-0.5 mg total) by mouth 2 (two) times daily as needed for anxiety. Qty: 30 tablet, Refills: 0   Associated Diagnoses: Situational anxiety    losartan (COZAAR) 50 MG tablet Take 50 mg by mouth daily.    nitroGLYCERIN (NITROSTAT) 0.4 MG SL tablet Place 0.4 mg under the tongue every 5 (five) minutes as needed for chest pain.    ticagrelor (BRILINTA) 90 MG TABS tablet Take 90 mg by mouth 2 (two) times daily.    topiramate (TOPAMAX) 50 MG tablet Take 50 mg by mouth 2 (two) times  daily.    triamcinolone (NASACORT) 55 MCG/ACT AERO nasal inhaler Place 2 sprays into the nose daily.    acetaminophen (TYLENOL) 500 MG tablet Take 1,000 mg by mouth every 6 (six) hours as needed for moderate pain.          Outstanding Labs/Studies   None  Duration of Discharge Encounter   Greater than 30 minutes including physician time.  Signed, Vinny Taranto PA-C 03/01/2017, 2:04 PM

## 2017-03-06 DIAGNOSIS — Z955 Presence of coronary angioplasty implant and graft: Secondary | ICD-10-CM | POA: Diagnosis not present

## 2017-03-06 DIAGNOSIS — I10 Essential (primary) hypertension: Secondary | ICD-10-CM | POA: Diagnosis not present

## 2017-03-06 DIAGNOSIS — E785 Hyperlipidemia, unspecified: Secondary | ICD-10-CM | POA: Diagnosis not present

## 2017-03-06 DIAGNOSIS — I2510 Atherosclerotic heart disease of native coronary artery without angina pectoris: Secondary | ICD-10-CM | POA: Diagnosis not present

## 2017-03-06 DIAGNOSIS — R072 Precordial pain: Secondary | ICD-10-CM | POA: Diagnosis not present

## 2017-03-15 ENCOUNTER — Encounter (HOSPITAL_BASED_OUTPATIENT_CLINIC_OR_DEPARTMENT_OTHER): Payer: Self-pay | Admitting: *Deleted

## 2017-03-15 DIAGNOSIS — Z7982 Long term (current) use of aspirin: Secondary | ICD-10-CM | POA: Diagnosis not present

## 2017-03-15 DIAGNOSIS — Z79899 Other long term (current) drug therapy: Secondary | ICD-10-CM | POA: Diagnosis not present

## 2017-03-15 DIAGNOSIS — Z87891 Personal history of nicotine dependence: Secondary | ICD-10-CM | POA: Diagnosis not present

## 2017-03-15 DIAGNOSIS — I252 Old myocardial infarction: Secondary | ICD-10-CM | POA: Insufficient documentation

## 2017-03-15 DIAGNOSIS — I10 Essential (primary) hypertension: Secondary | ICD-10-CM | POA: Diagnosis not present

## 2017-03-15 DIAGNOSIS — R0981 Nasal congestion: Secondary | ICD-10-CM | POA: Insufficient documentation

## 2017-03-15 NOTE — ED Triage Notes (Signed)
Pt c/o severe sinus congestion x 1 week, scheduled for sinus surgery

## 2017-03-16 ENCOUNTER — Emergency Department (HOSPITAL_BASED_OUTPATIENT_CLINIC_OR_DEPARTMENT_OTHER)
Admission: EM | Admit: 2017-03-16 | Discharge: 2017-03-16 | Disposition: A | Discharge status: Home / Self Care | Payer: BLUE CROSS/BLUE SHIELD | Attending: Emergency Medicine | Admitting: Emergency Medicine

## 2017-03-16 ENCOUNTER — Encounter (HOSPITAL_BASED_OUTPATIENT_CLINIC_OR_DEPARTMENT_OTHER): Payer: Self-pay | Admitting: Emergency Medicine

## 2017-03-16 DIAGNOSIS — R0981 Nasal congestion: Secondary | ICD-10-CM

## 2017-03-16 MED ORDER — FLUTICASONE PROPIONATE 50 MCG/ACT NA SUSP: 2.0000 | Freq: Every day | NASAL | 0 refills | Status: DC

## 2017-03-16 NOTE — ED Provider Notes (Signed)
[facility] Provider Note   CSN: [id number] Arrival date & time: 03/15/17  2334     History   Chief Complaint Chief Complaint  Patient presents with  . Nasal Congestion    HPI Theresa Gibson is a 61 y.o. female.  The history is provided by the patient.  URI   This is a chronic problem. The current episode started more than 1 week ago (over a year). The problem has not changed since onset.There has been no fever. Associated symptoms include congestion. Pertinent negatives include no chest pain, no abdominal pain, no diarrhea, no nausea, no vomiting, no dysuria, no ear pain, no headaches, no plugged ear sensation, no rhinorrhea, no sinus pain, no sneezing, no sore throat, no swollen glands, no joint pain, no joint swelling, no neck pain, no cough, no rash and no wheezing. She has tried nothing for the symptoms. The treatment provided no relief.    Past Medical History:  Diagnosis Date  . Acute MI, inferior wall ([place]) 08/2016   s/p DES x 2 to the RCA  . Allergy   . Arthritis   . Depression   . GERD (gastroesophageal reflux disease)   . Hypertension   . Meningioma ([place])   . PONV (postoperative nausea and vomiting)    nausea only  . Wears dentures    upper    Patient Active Problem List   Diagnosis Date Noted  . Chest pain, moderate coronary artery risk 02/28/2017  . Chest pain 02/28/2017  . Situational anxiety 11/19/2016  . Myocardial infarction ([place]) 11/19/2016  . Cloudy urine 07/02/2016  . Acute appendicitis 12/10/2015  . Vitamin D deficiency 09/13/2015  . Pain in joint, shoulder region 07/26/2015  . Generalized headaches 04/21/2015  . Osteoarthritis 04/21/2015  . Routine general medical examination at a health care facility 09/21/2014  . Decreased libido 09/21/2014  . Palpitations 09/07/2014  . SCIATICA, ACUTE 03/21/2009  . HEAD TRAUMA, CLOSED 03/05/2008  . CARPAL TUNNEL SYNDROME, BILATERAL 12/03/2007  . SYMPTOM, HYPERSOMNIA W/SLEEP APNEA NOS  06/24/2007  . CAVERNOUS HEMANGIOMA 04/18/2007  . MALAISE AND FATIGUE 03/27/2007  . Hyperlipidemia with target LDL less than 70 12/10/2006  . Obesity 12/10/2006  . DISORDER, TOBACCO USE 12/10/2006  . Depression 12/10/2006  . Essential hypertension 12/10/2006  . SINUSITIS, CHRONIC NOS 12/10/2006  . ALLERGIC RHINITIS 12/10/2006  . GERD 12/10/2006    Past Surgical History:  Procedure Laterality Date  . ABDOMINAL HYSTERECTOMY     Total   . BREAST SURGERY     BX right breast  . COLONOSCOPY W/ BIOPSIES AND POLYPECTOMY    . CORONARY ANGIOPLASTY WITH STENT PLACEMENT    . KNEE SURGERY Bilateral    arthroscopy  . LAPAROSCOPIC APPENDECTOMY N/A 12/10/2015   Procedure: APPENDECTOMY LAPAROSCOPIC;  Surgeon: Erroll Luna, MD;  Location: [facility];  Service: General;  Laterality: N/A;  . MULTIPLE TOOTH EXTRACTIONS    . TUBAL LIGATION      OB History    No data available       Home Medications    Prior to Admission medications   Medication Sig Start Date End Date Taking? Authorizing Provider  acetaminophen (TYLENOL) 500 MG tablet Take 1,000 mg by mouth every 6 (six) hours as needed for moderate pain.    Historical Provider, MD  aspirin EC 81 MG tablet Take 81 mg by mouth daily.    Historical Provider, MD  atorvastatin (LIPITOR) 40 MG tablet Take 40 mg by mouth daily at 6 PM.  Historical Provider, MD  buPROPion (WELLBUTRIN SR) 150 MG 12 hr tablet Take 1 tablet by mouth daily for 3 days then increase to 1 tablet by mouth daily. Patient taking differently: Take 150 mg by mouth 2 (two) times daily.  02/22/17   Golden Circle, FNP  carvedilol (COREG) 3.125 MG tablet Take 3.125 mg by mouth 2 (two) times daily with a meal.    Historical Provider, MD  cetirizine (ZYRTEC) 10 MG tablet Take 10 mg by mouth daily as needed for allergies.    Historical Provider, MD  cholecalciferol (VITAMIN D) 1000 units tablet Take 1,000 Units by mouth daily.    Historical Provider, MD  clonazePAM (KLONOPIN) 0.5 MG  tablet Take 0.5-1 tablets (0.25-0.5 mg total) by mouth 2 (two) times daily as needed for anxiety. 11/19/16   Golden Circle, FNP  fluticasone (FLONASE) 50 MCG/ACT nasal spray Place 2 sprays into both nostrils daily. 03/16/17   Tony Granquist, MD  losartan (COZAAR) 50 MG tablet Take 50 mg by mouth daily.    Historical Provider, MD  nitroGLYCERIN (NITROSTAT) 0.4 MG SL tablet Place 0.4 mg under the tongue every 5 (five) minutes as needed for chest pain.    Historical Provider, MD  pantoprazole (PROTONIX) 40 MG tablet Take 1 tablet (40 mg total) by mouth daily at 6 (six) AM. 03/02/17   Leanor Kail, PA  ticagrelor (BRILINTA) 90 MG TABS tablet Take 90 mg by mouth 2 (two) times daily.    Historical Provider, MD  topiramate (TOPAMAX) 50 MG tablet Take 50 mg by mouth 2 (two) times daily.    Historical Provider, MD  triamcinolone (NASACORT) 55 MCG/ACT AERO nasal inhaler Place 2 sprays into the nose daily. 01/04/17   Shanon Rosser, MD    Family History Family History  Problem Relation Age of Onset  . Hypertension Mother   . Arthritis Mother   . Heart attack Father     In his 40's  . Ovarian cancer Maternal Grandmother   . Colon cancer Neg Hx   . Colon polyps Neg Hx   . Rectal cancer Neg Hx   . Stomach cancer Neg Hx     Social History Social History  Substance Use Topics  . Smoking status: Former Smoker    Packs/day: 0.50    Years: 42.00    Types: Cigarettes    Quit date: 11/02/2016  . Smokeless tobacco: Never Used  . Alcohol use No     Allergies   Nicotine and Ace inhibitors   Review of Systems Review of Systems  Constitutional: Negative for fever.  HENT: Positive for congestion. Negative for dental problem, drooling, ear discharge, ear pain, rhinorrhea, sinus pain, sneezing, sore throat, trouble swallowing and voice change.   Respiratory: Negative for cough and wheezing.   Cardiovascular: Negative for chest pain.  Gastrointestinal: Negative for abdominal pain, diarrhea, nausea  and vomiting.  Genitourinary: Negative for dysuria.  Musculoskeletal: Negative for joint pain and neck pain.  Skin: Negative for rash.  Neurological: Negative for headaches.  All other systems reviewed and are negative.    Physical Exam Updated Vital Signs BP 110/80   Pulse 73   Temp 98 F (36.7 C) (Oral)   Resp 16   SpO2 100%   Physical Exam  Constitutional: She is oriented to person, place, and time. She appears well-developed and well-nourished. No distress.  HENT:  Head: Normocephalic and atraumatic.  Right Ear: External ear normal.  Left Ear: External ear normal.  Mouth/Throat: Oropharynx is clear  and moist. No oropharyngeal exudate.  Boggy nasal turbinates  Eyes: Conjunctivae and EOM are normal. Pupils are equal, round, and reactive to light.  Neck: Normal range of motion. Neck supple. No JVD present.  Cardiovascular: Normal rate, regular rhythm, normal heart sounds and intact distal pulses.   Pulmonary/Chest: Effort normal and breath sounds normal. No stridor. No respiratory distress. She has no wheezes. She has no rales.  Abdominal: Soft. Bowel sounds are normal. She exhibits no mass. There is no tenderness. There is no rebound and no guarding.  Musculoskeletal: Normal range of motion. She exhibits no edema or tenderness.  Lymphadenopathy:    She has no cervical adenopathy.  Neurological: She is alert and oriented to person, place, and time. She displays normal reflexes.  Skin: Skin is warm and dry. Capillary refill takes less than 2 seconds.  Psychiatric: She has a normal mood and affect.     ED Treatments / Results   Vitals:   03/15/17 2343  BP: 110/80  Pulse: 73  Resp: 16  Temp: 98 F (36.7 C)    Procedures Procedures (including critical care time)  Medications Ordered in ED    Final Clinical Impressions(s) / ED Diagnoses   Final diagnoses:  Nasal congestion  Will prescribe Flonase and breath right strips.  Follow up with your ENT for ongoing  symptoms.  Exam, and vitals are benign and reassuring. Return for fevers, nasal bleeding or discharge, wheezing palpitations, dyspnea on exertion or any concerns  After history, exam, and medical workup I feel the patient has been appropriately medically screened and is safe for discharge home. Pertinent diagnoses were discussed with the patient. Patient was given return precautions.   New Prescriptions Discharge Medication List as of 03/16/2017  2:53 AM    START taking these medications   Details  fluticasone (FLONASE) 50 MCG/ACT nasal spray Place 2 sprays into both nostrils daily., Starting Sat 03/16/2017, Print         Jarrod Bodkins, MD 03/16/17 [phone]

## 2017-03-26 ENCOUNTER — Encounter (HOSPITAL_COMMUNITY): Payer: Self-pay | Admitting: *Deleted

## 2017-03-26 NOTE — Progress Notes (Signed)
Pt denies any acute cardiopulmonary issues but is under the care of Dr. Marijo File, Cardiology. Pt denies having an echo. Pt stated that she was instructed by her cardiologist to stop taking Aspirin and Brilinta, last dose for both was Sunday. Pt made aware to stop taking vitamins, fish oil and herbal medications. Do not take any NSAIDs ie: Ibuprofen, Advil, Naproxen, BC and Goody Powder.Pt verbalized understanding of all pre-op instructions. Anesthesia asked to review cardiac clearance note in Care Everywhere. Requested EKG tracing.

## 2017-03-27 ENCOUNTER — Other Ambulatory Visit: Payer: Self-pay | Admitting: Otolaryngology

## 2017-03-28 NOTE — Progress Notes (Signed)
Anesthesia follow-up: SAME DAY WORK-UP. See my anesthesia note from 02/27/17.   Patient arrived for endoscopic sinus surgery with navigation, turbinate reduction on 02/28/2017 by Dr. Janace Hoard. However, she reported chest pain in Holding and had known history of inferior STEMI with DES RCA X 2 08/24/16 and had been off ASA and Plavix (with her cardiologist's knowledge) for five days prior. Surgery was cancelled, and she was admitted overnight by cardiology. Serial troponins were negative. Stress test showed no reversible ischemia. She was discharged home on 03/01/17 with recommendation to follow-up with her primary cardiologist Dr. Marijo File. She saw him on 03/06/17. He wrote: " CAD, S/P PCI stable, recent negative stress SHOULD BE OK FOR SURGERY FROM A CARDIAC STANDPOINT. HOLD BRILINTA 5 DAYS BEFORE"  According to Dr. Peggyann Juba 02/18/17 note, patient has had persistent problems with chronic sinusitis despite good medical therapy. "At this point she is very frustrated and she is just about demanding endoscopic sinus surgery and nasal turbinate reduction. She understands that we need to check with her cardiologist regarding the safety of outpatient or inpatient and whether she can come off the blood thinner. We discussed the endoscopic sinus surgery. We discussed risks, benefits, and options. All her questions were answered and consent was obtained."   Meds include ASA 81 mg, Lipitor, Wellbutrin SR, Coreg, Zyrtec, Klonopin, Flonase, losartan, Nitro, Protonix, Brilinta (on hold), Topamax, Nasacort. She reported that as before, she was instructed to hold ASA and Brilinta 5 days prior to surgery. She said this was discussed with Dr. Marijo File who reportedly responded to the effect that preference would be to continue ASA but if surgeon needed both held then would still be okay to proceed. Willeen Cass, FNP-BC had already reviewed with with Dr. Marijo File when patient was scheduled in March.   EKG 03/01/17 ([facility]): SR with PACs,  minimal voltage criteria for LVH, may be normal variant. (03/06/17 tracing requested from [facility], Dr. Marijo File but is still pending.)  Nuclear stress test 03/01/17: IMPRESSION: 1. No reversible ischemia. Apical thinning noted but no definite scar identified. 2. Normal left ventricular wall motion. 3. Left ventricular ejection fraction 62% 4. Non invasive risk stratification*: Low  Cardiac cath 08/24/16 [facility]; Care Everywhere): Conclusions Diagnostic Summary: Normal LMCA. 15% proximal LAD. 10% distal LAD. 5% proximal CX. 95% distal RCA.  Severe stenosis of the RCA. Otherwise non-obstructive coronary artery disease. Normal LV function, LV ejection fraction is 60 %. Interventional Summary Successful PCI / Xience Drug Eluting Stent of the distal Right Coronary Artery. Successful PCI / Xience Drug Eluting Stent of the proximal Right Coronary Artery. Interventional Recommendations Medical therapy for MI, CAD Dual anti-platelet therapy with Ticagrelor and Aspirin 81 mg for at least 6-12 months is recommended.  She is for updated labs prior to surgery.   I have updated anesthesiologist Dr. Smith Robert. Patient to be further evaluated on the day of surgery.  George Hugh [facility] Short Stay Center/Anesthesiology Phone [phone] 03/28/2017 4:46 PM

## 2017-03-29 ENCOUNTER — Observation Stay (HOSPITAL_COMMUNITY)
Admission: RE | Admit: 2017-03-29 | Discharge: 2017-03-29 | Disposition: A | Discharge status: Home / Self Care | Payer: BLUE CROSS/BLUE SHIELD | Source: Ambulatory Visit | Attending: Otolaryngology | Admitting: Otolaryngology

## 2017-03-29 ENCOUNTER — Ambulatory Visit (HOSPITAL_COMMUNITY): Payer: BLUE CROSS/BLUE SHIELD | Admitting: Vascular Surgery

## 2017-03-29 ENCOUNTER — Encounter (HOSPITAL_COMMUNITY): Payer: Self-pay | Admitting: Certified Registered Nurse Anesthetist

## 2017-03-29 ENCOUNTER — Encounter (HOSPITAL_COMMUNITY)
Admission: RE | Disposition: A | Discharge status: Home / Self Care | Payer: Self-pay | Source: Ambulatory Visit | Attending: Otolaryngology

## 2017-03-29 DIAGNOSIS — Z8041 Family history of malignant neoplasm of ovary: Secondary | ICD-10-CM | POA: Insufficient documentation

## 2017-03-29 DIAGNOSIS — F418 Other specified anxiety disorders: Secondary | ICD-10-CM | POA: Diagnosis not present

## 2017-03-29 DIAGNOSIS — Z6841 Body Mass Index (BMI) 40.0 and over, adult: Secondary | ICD-10-CM | POA: Insufficient documentation

## 2017-03-29 DIAGNOSIS — M1990 Unspecified osteoarthritis, unspecified site: Secondary | ICD-10-CM | POA: Diagnosis not present

## 2017-03-29 DIAGNOSIS — Z8249 Family history of ischemic heart disease and other diseases of the circulatory system: Secondary | ICD-10-CM | POA: Diagnosis not present

## 2017-03-29 DIAGNOSIS — J329 Chronic sinusitis, unspecified: Principal | ICD-10-CM | POA: Diagnosis present

## 2017-03-29 DIAGNOSIS — Z87891 Personal history of nicotine dependence: Secondary | ICD-10-CM | POA: Diagnosis not present

## 2017-03-29 DIAGNOSIS — I10 Essential (primary) hypertension: Secondary | ICD-10-CM | POA: Insufficient documentation

## 2017-03-29 DIAGNOSIS — Z7982 Long term (current) use of aspirin: Secondary | ICD-10-CM | POA: Diagnosis not present

## 2017-03-29 DIAGNOSIS — J343 Hypertrophy of nasal turbinates: Secondary | ICD-10-CM | POA: Insufficient documentation

## 2017-03-29 DIAGNOSIS — Z8261 Family history of arthritis: Secondary | ICD-10-CM | POA: Diagnosis not present

## 2017-03-29 DIAGNOSIS — Z79899 Other long term (current) drug therapy: Secondary | ICD-10-CM | POA: Insufficient documentation

## 2017-03-29 DIAGNOSIS — J322 Chronic ethmoidal sinusitis: Secondary | ICD-10-CM | POA: Diagnosis not present

## 2017-03-29 DIAGNOSIS — K219 Gastro-esophageal reflux disease without esophagitis: Secondary | ICD-10-CM | POA: Diagnosis not present

## 2017-03-29 DIAGNOSIS — J321 Chronic frontal sinusitis: Secondary | ICD-10-CM | POA: Diagnosis not present

## 2017-03-29 DIAGNOSIS — Z888 Allergy status to other drugs, medicaments and biological substances status: Secondary | ICD-10-CM | POA: Insufficient documentation

## 2017-03-29 DIAGNOSIS — Z86010 Personal history of colonic polyps: Secondary | ICD-10-CM | POA: Insufficient documentation

## 2017-03-29 DIAGNOSIS — F329 Major depressive disorder, single episode, unspecified: Secondary | ICD-10-CM | POA: Insufficient documentation

## 2017-03-29 DIAGNOSIS — J320 Chronic maxillary sinusitis: Secondary | ICD-10-CM | POA: Diagnosis not present

## 2017-03-29 DIAGNOSIS — Z90710 Acquired absence of both cervix and uterus: Secondary | ICD-10-CM | POA: Diagnosis not present

## 2017-03-29 DIAGNOSIS — Z955 Presence of coronary angioplasty implant and graft: Secondary | ICD-10-CM | POA: Diagnosis not present

## 2017-03-29 DIAGNOSIS — R002 Palpitations: Secondary | ICD-10-CM | POA: Diagnosis not present

## 2017-03-29 DIAGNOSIS — I252 Old myocardial infarction: Secondary | ICD-10-CM | POA: Insufficient documentation

## 2017-03-29 HISTORY — PX: TURBINATE REDUCTION: SHX6157

## 2017-03-29 HISTORY — PX: SINUS ENDO W/FUSION: SHX777

## 2017-03-29 HISTORY — PX: NASAL/SINUS ENDOSCOPY: SHX288

## 2017-03-29 LAB — BASIC METABOLIC PANEL
ANION GAP: 8 (ref 5–15)
BUN: 15 mg/dL (ref 6–20)
CALCIUM: 9.1 mg/dL (ref 8.9–10.3)
CO2: 21 mmol/L — AB (ref 22–32)
CREATININE: 0.81 mg/dL (ref 0.44–1.00)
Chloride: 112 mmol/L — ABNORMAL HIGH (ref 101–111)
Glucose, Bld: 132 mg/dL — ABNORMAL HIGH (ref 65–99)
Potassium: 3.7 mmol/L (ref 3.5–5.1)
SODIUM: 141 mmol/L (ref 135–145)

## 2017-03-29 LAB — CBC
HCT: 38.4 % (ref 36.0–46.0)
HEMATOCRIT: 38.3 % (ref 36.0–46.0)
HEMOGLOBIN: 12.9 g/dL (ref 12.0–15.0)
HEMOGLOBIN: 12.6 g/dL (ref 12.0–15.0)
MCH: 28.4 pg (ref 26.0–34.0)
MCH: 27.9 pg (ref 26.0–34.0)
MCHC: 33.6 g/dL (ref 30.0–36.0)
MCHC: 32.9 g/dL (ref 30.0–36.0)
MCV: 84.4 fL (ref 78.0–100.0)
MCV: 84.9 fL (ref 78.0–100.0)
PLATELETS: 238 10*3/uL (ref 150–400)
Platelets: 224 10*3/uL (ref 150–400)
RBC: 4.55 MIL/uL (ref 3.87–5.11)
RBC: 4.51 MIL/uL (ref 3.87–5.11)
RDW: 15.2 % (ref 11.5–15.5)
RDW: 15.2 % (ref 11.5–15.5)
WBC: 8.8 10*3/uL (ref 4.0–10.5)
WBC: 7.9 10*3/uL (ref 4.0–10.5)

## 2017-03-29 LAB — PROTIME-INR
INR: 1.08
PROTHROMBIN TIME: 14.1 s (ref 11.4–15.2)

## 2017-03-29 LAB — CREATININE, SERUM
CREATININE: 0.83 mg/dL (ref 0.44–1.00)
GFR calc Af Amer: >60 mL/min (ref >60)
GFR calc non Af Amer: >60 mL/min (ref >60)

## 2017-03-29 SURGERY — SINUS SURGERY, ENDOSCOPIC, USING COMPUTER-ASSISTED NAVIGATION
Anesthesia: General | Site: Nose | Laterality: Bilateral

## 2017-03-29 MED ORDER — MUPIROCIN 2 % EX OINT
TOPICAL_OINTMENT | CUTANEOUS | Status: AC
Start: 2017-03-29 — End: 2017-03-29
  Filled 2017-03-29: qty 22

## 2017-03-29 MED ORDER — ARTIFICIAL TEARS OPHTHALMIC OINT
TOPICAL_OINTMENT | OPHTHALMIC | Status: DC | PRN
  Administered 2017-03-29: 1 via OPHTHALMIC

## 2017-03-29 MED ORDER — LIDOCAINE-EPINEPHRINE 1 %-1:100000 IJ SOLN
INTRAMUSCULAR | Status: DC | PRN
  Administered 2017-03-29: 5 mL

## 2017-03-29 MED ORDER — LACTATED RINGERS IV SOLN
INTRAVENOUS | Status: DC
  Administered 2017-03-29: 09:00:00 via INTRAVENOUS

## 2017-03-29 MED ORDER — CEPHALEXIN 500 MG PO CAPS
500.0000 mg | ORAL_CAPSULE | Freq: Three times a day (TID) | ORAL | Status: DC
  Administered 2017-03-29: 500 mg via ORAL
  Filled 2017-03-29: qty 1

## 2017-03-29 MED ORDER — EPHEDRINE SULFATE-NACL 50-0.9 MG/10ML-% IV SOSY
PREFILLED_SYRINGE | INTRAVENOUS | Status: DC | PRN
  Administered 2017-03-29: 10 mg via INTRAVENOUS
  Administered 2017-03-29 (×2): 5 mg via INTRAVENOUS

## 2017-03-29 MED ORDER — ONDANSETRON 4 MG PO TBDP: 4.0000 mg | ORAL_TABLET | Freq: Four times a day (QID) | ORAL | Status: DC | PRN

## 2017-03-29 MED ORDER — SUGAMMADEX SODIUM 200 MG/2ML IV SOLN
INTRAVENOUS | Status: DC | PRN
  Administered 2017-03-29: 200 mg via INTRAVENOUS

## 2017-03-29 MED ORDER — PHENYLEPHRINE 40 MCG/ML (10ML) SYRINGE FOR IV PUSH (FOR BLOOD PRESSURE SUPPORT)
PREFILLED_SYRINGE | INTRAVENOUS | Status: DC | PRN
  Administered 2017-03-29: 40 ug via INTRAVENOUS
  Administered 2017-03-29 (×2): 80 ug via INTRAVENOUS
  Administered 2017-03-29: 40 ug via INTRAVENOUS
  Administered 2017-03-29: 80 ug via INTRAVENOUS

## 2017-03-29 MED ORDER — DEXAMETHASONE SODIUM PHOSPHATE 10 MG/ML IJ SOLN
INTRAMUSCULAR | Status: DC | PRN
  Administered 2017-03-29: 10 mg via INTRAVENOUS

## 2017-03-29 MED ORDER — DEXTROSE-NACL 5-0.45 % IV SOLN: INTRAVENOUS | Status: DC

## 2017-03-29 MED ORDER — ONDANSETRON HCL 4 MG/2ML IJ SOLN
INTRAMUSCULAR | Status: DC | PRN
  Administered 2017-03-29: 4 mg via INTRAVENOUS

## 2017-03-29 MED ORDER — HYDROCODONE-ACETAMINOPHEN 5-325 MG PO TABS: 1.0000 | ORAL_TABLET | Freq: Four times a day (QID) | ORAL | 0 refills | Status: DC | PRN

## 2017-03-29 MED ORDER — 0.9 % SODIUM CHLORIDE (POUR BTL) OPTIME
TOPICAL | Status: DC | PRN
  Administered 2017-03-29: 1000 mL

## 2017-03-29 MED ORDER — ONDANSETRON HCL 4 MG/2ML IJ SOLN
INTRAMUSCULAR | Status: AC
  Filled 2017-03-29: qty 2

## 2017-03-29 MED ORDER — PHENYLEPHRINE HCL 10 MG/ML IJ SOLN: INTRAVENOUS | Status: DC | PRN

## 2017-03-29 MED ORDER — PROPOFOL 10 MG/ML IV BOLUS
INTRAVENOUS | Status: AC
  Filled 2017-03-29: qty 20

## 2017-03-29 MED ORDER — ROCURONIUM BROMIDE 10 MG/ML (PF) SYRINGE
PREFILLED_SYRINGE | INTRAVENOUS | Status: DC | PRN
  Administered 2017-03-29: 50 mg via INTRAVENOUS

## 2017-03-29 MED ORDER — SCOPOLAMINE 1 MG/3DAYS TD PT72
1.0000 | MEDICATED_PATCH | TRANSDERMAL | Status: DC
  Administered 2017-03-29: 1.5 mg via TRANSDERMAL
  Filled 2017-03-29: qty 1

## 2017-03-29 MED ORDER — LIDOCAINE 2% (20 MG/ML) 5 ML SYRINGE
INTRAMUSCULAR | Status: DC | PRN
  Administered 2017-03-29: 60 mg via INTRAVENOUS

## 2017-03-29 MED ORDER — FENTANYL CITRATE (PF) 250 MCG/5ML IJ SOLN
INTRAMUSCULAR | Status: AC
  Filled 2017-03-29: qty 5

## 2017-03-29 MED ORDER — MIDAZOLAM HCL 2 MG/2ML IJ SOLN
INTRAMUSCULAR | Status: DC | PRN
  Administered 2017-03-29: 2 mg via INTRAVENOUS

## 2017-03-29 MED ORDER — SODIUM CHLORIDE 0.9 % IR SOLN
Status: DC | PRN
  Administered 2017-03-29: 1000 mL

## 2017-03-29 MED ORDER — HYDROMORPHONE HCL 1 MG/ML IJ SOLN
INTRAMUSCULAR | Status: AC
  Administered 2017-03-29: 0.5 mg via INTRAVENOUS
  Filled 2017-03-29: qty 1

## 2017-03-29 MED ORDER — TOPIRAMATE 100 MG PO TABS
50.0000 mg | ORAL_TABLET | Freq: Two times a day (BID) | ORAL | Status: DC
Start: 2017-03-29 — End: 2017-03-29

## 2017-03-29 MED ORDER — HYDROMORPHONE HCL 1 MG/ML IJ SOLN
0.2500 mg | INTRAMUSCULAR | Status: DC | PRN
  Administered 2017-03-29 (×2): 0.5 mg via INTRAVENOUS

## 2017-03-29 MED ORDER — LIDOCAINE-EPINEPHRINE 1 %-1:100000 IJ SOLN
INTRAMUSCULAR | Status: AC
  Filled 2017-03-29: qty 1

## 2017-03-29 MED ORDER — SUGAMMADEX SODIUM 200 MG/2ML IV SOLN
INTRAVENOUS | Status: AC
  Filled 2017-03-29: qty 2

## 2017-03-29 MED ORDER — FENTANYL CITRATE (PF) 250 MCG/5ML IJ SOLN
INTRAMUSCULAR | Status: DC | PRN
  Administered 2017-03-29: 100 ug via INTRAVENOUS

## 2017-03-29 MED ORDER — MIDAZOLAM HCL 2 MG/2ML IJ SOLN
INTRAMUSCULAR | Status: AC
  Filled 2017-03-29: qty 2

## 2017-03-29 MED ORDER — ONDANSETRON HCL 4 MG/2ML IJ SOLN: 4.0000 mg | Freq: Four times a day (QID) | INTRAMUSCULAR | Status: DC | PRN

## 2017-03-29 MED ORDER — HEPARIN SODIUM (PORCINE) 5000 UNIT/ML IJ SOLN: 5000.0000 [IU] | Freq: Three times a day (TID) | INTRAMUSCULAR | Status: DC

## 2017-03-29 MED ORDER — CEPHALEXIN 500 MG PO CAPS: 500.0000 mg | ORAL_CAPSULE | Freq: Three times a day (TID) | ORAL | 0 refills | Status: DC

## 2017-03-29 MED ORDER — CARVEDILOL 3.125 MG PO TABS
3.1250 mg | ORAL_TABLET | Freq: Two times a day (BID) | ORAL | Status: DC
  Administered 2017-03-29: 3.125 mg via ORAL
  Filled 2017-03-29: qty 1

## 2017-03-29 MED ORDER — OXYMETAZOLINE HCL 0.05 % NA SOLN
NASAL | Status: AC
  Filled 2017-03-29: qty 15

## 2017-03-29 MED ORDER — EPHEDRINE 5 MG/ML INJ
INTRAVENOUS | Status: AC
  Filled 2017-03-29: qty 10

## 2017-03-29 MED ORDER — PROPOFOL 10 MG/ML IV BOLUS
INTRAVENOUS | Status: DC | PRN
  Administered 2017-03-29: 120 mg via INTRAVENOUS

## 2017-03-29 MED ORDER — HYDROCODONE-ACETAMINOPHEN 5-325 MG PO TABS: 1.0000 | ORAL_TABLET | ORAL | Status: DC | PRN

## 2017-03-29 MED ORDER — OXYMETAZOLINE HCL 0.05 % NA SOLN
NASAL | Status: DC | PRN
  Administered 2017-03-29: 1 via TOPICAL

## 2017-03-29 MED ORDER — ROCURONIUM BROMIDE 10 MG/ML (PF) SYRINGE
PREFILLED_SYRINGE | INTRAVENOUS | Status: AC
  Filled 2017-03-29: qty 5

## 2017-03-29 SURGICAL SUPPLY — 45 items
BLADE ROTATE RAD 40 4 M4 (BLADE) IMPLANT
BLADE ROTATE TRICUT 4X13 M4 (BLADE) IMPLANT
CANISTER SUCT 3000ML PPV (MISCELLANEOUS) ×2 IMPLANT
COAGULATOR SUCT SWTCH 10FR 6 (ELECTROSURGICAL) ×3 IMPLANT
CONT SPEC 4OZ CLIKSEAL STRL BL (MISCELLANEOUS) IMPLANT
CORDS BIPOLAR (ELECTRODE) IMPLANT
CRADLE DONUT ADULT HEAD (MISCELLANEOUS) IMPLANT
DECANTER SPIKE VIAL GLASS SM (MISCELLANEOUS) ×1 IMPLANT
DRAPE HALF SHEET 40X57 (DRAPES) IMPLANT
DRSG NASOPORE 8CM (GAUZE/BANDAGES/DRESSINGS) ×1 IMPLANT
DURASEAL SPINE SEALANT 3ML (MISCELLANEOUS) IMPLANT
ELECT REM PT RETURN 9FT ADLT (ELECTROSURGICAL)
ELECTRODE REM PT RTRN 9FT ADLT (ELECTROSURGICAL) IMPLANT
FILTER ARTHROSCOPY CONVERTOR (FILTER) ×2 IMPLANT
FLOSEAL 10ML (HEMOSTASIS) IMPLANT
GLOVE ECLIPSE 7.5 STRL STRAW (GLOVE) ×2 IMPLANT
GOWN BRE IMP SLV SIRUS LXLNG (GOWN DISPOSABLE) ×2 IMPLANT
GOWN STRL REUS W/ TWL LRG LVL3 (GOWN DISPOSABLE) ×1 IMPLANT
GOWN STRL REUS W/TWL LRG LVL3 (GOWN DISPOSABLE) ×2
KIT BASIN OR (CUSTOM PROCEDURE TRAY) ×2 IMPLANT
KIT ROOM TURNOVER OR (KITS) ×2 IMPLANT
NDL HYPO 25GX1X1/2 BEV (NEEDLE) IMPLANT
NDL SPNL 25GX3.5 QUINCKE BL (NEEDLE) IMPLANT
NEEDLE HYPO 25GX1X1/2 BEV (NEEDLE) ×2 IMPLANT
NEEDLE SPNL 25GX3.5 QUINCKE BL (NEEDLE) IMPLANT
NS IRRIG 1000ML POUR BTL (IV SOLUTION) ×2 IMPLANT
PAD ARMBOARD 7.5X6 YLW CONV (MISCELLANEOUS) ×4 IMPLANT
PAD ENT ADHESIVE 25PK (MISCELLANEOUS) ×2 IMPLANT
PATTIES SURGICAL .5 X3 (DISPOSABLE) ×2 IMPLANT
PENCIL FOOT CONTROL (ELECTRODE) IMPLANT
PREP POVIDONE IODINE SPRAY 2OZ (MISCELLANEOUS) ×1 IMPLANT
SOLUTION ANTI FOG 6CC (MISCELLANEOUS) ×2 IMPLANT
SPECIMEN JAR SMALL (MISCELLANEOUS) ×4 IMPLANT
STRIP CLOSURE SKIN 1/2X4 (GAUZE/BANDAGES/DRESSINGS) IMPLANT
SUT ETHILON 3 0 PS 1 (SUTURE) IMPLANT
SWAB COLLECTION DEVICE MRSA (MISCELLANEOUS) IMPLANT
SYR 50ML SLIP (SYRINGE) IMPLANT
SYR CONTROL 10ML LL (SYRINGE) ×2 IMPLANT
TRACKER ENT INSTRUMENT (MISCELLANEOUS) ×2 IMPLANT
TRACKER ENT PATIENT (MISCELLANEOUS) ×2 IMPLANT
TRAY ENT MC OR (CUSTOM PROCEDURE TRAY) ×2 IMPLANT
TUBE CONNECTING 20X1/4 (TUBING) ×2 IMPLANT
TUBING STRAIGHTSHOT EPS 5PK (TUBING) ×1 IMPLANT
WATER STERILE IRR 1000ML POUR (IV SOLUTION) ×2 IMPLANT
WIPE INSTRUMENT VISIWIPE 73X73 (MISCELLANEOUS) ×2 IMPLANT

## 2017-03-29 NOTE — Progress Notes (Signed)
Patient discharged with instructions and prescription.

## 2017-03-29 NOTE — Op Note (Signed)
Preop/postop diagnosis: Chronic sinusitis and turbinate hypertrophy  Procedure: Bilateral antrostomy maxillary, bilateral anterior ethmoidectomy, bilateral frontal sinusotomy, fusion computer guidance, and inferior turbinate reduction Anesthesia: Gen. Estimated blood loss: Approximately 100 mL Indications: 61 year old with a chronic history of sinusitis problems and nasal obstruction. She's very frustrated this point with some thickening on CT scan of her sinuses but also turbinate hypertrophy. She suffers from near-complete nasal obstruction most the time. She was informed risks and benefits of the procedures and options were discussed. All questions were answered and consent was obtained. Procedure: Patient taken to the operating room placed in supine position under general endotracheal tube anesthesia was prepped and draped in usual sterile manner. The fusion computer system was position calibrated with excellent accuracy. Oxymetazoline plates are placed in the inferior turbinate middle turbinates were injected with 1% lidocaine with 1 100,000 epinephrine. The turbinates were extremely hypertrophied as well as the middle turbinates had polypoid degeneration. The left side was begun using the microdebrider to debride the lateral aspect of the middle turbinate creating a more open sinus. This allowed visualization into the sinus. There was polypoid material around the maxillary antrostomy which was removed and opened more widely. There is no thickened debris or purulence within the maxillary sinus. The ethmoids were opened. The anterior ethmoid was dissected with microdebrider infusion guidance. The nasofrontal duct was cannulated with a curved guided suction which did open out the nasofrontal duct up into the frontal sinus. Oxymetazoline blister placed. The right side was then repeated in a similar fashion with again polypoid material all along the inferior middle turbinate which was removed and the  microdebrider. The ethmoid was opened and cleaning the anterior ethmoid was dissected and opened up as well as the nasofrontal duct as the left side was performed. The antrostomy was opened wider with polypoid material around it. Oxymetazoline plates are placed. The inferior turbinates were then debrided with microdebrider and worked very hypertrophied. This hypertrophy was all the way to the posterior aspect of the turbinate and this was all removed. There is about 50% of the turbinate was removed. The edge was cauterized with suction cautery which gained good hemostasis. The nasopharynx was suctioned out of all blood and debris. The pledgets were removed and nasal poor soaked in Bactroban was placed into the ethmoid cavities bilaterally. The patient was then awakened brought to recovery in stable condition counts correct

## 2017-03-29 NOTE — H&P (Addendum)
Theresa Gibson is an 61 y.o. female.   Chief Complaint: sinusitis HPI: hx of nasal obstrcution and very frustrated  Past Medical History:  Diagnosis Date  . Acute MI, inferior wall ([place]) 08/2016   s/p DES x 2 to the RCA  . Allergy   . Arthritis   . Depression   . GERD (gastroesophageal reflux disease)   . Hypertension   . Meningioma ([place])   . PONV (postoperative nausea and vomiting)    nausea only  . Wears dentures    upper    Past Surgical History:  Procedure Laterality Date  . ABDOMINAL HYSTERECTOMY     Total   . BREAST SURGERY     BX right breast  . COLONOSCOPY W/ BIOPSIES AND POLYPECTOMY    . CORONARY ANGIOPLASTY WITH STENT PLACEMENT    . KNEE SURGERY Bilateral    arthroscopy  . LAPAROSCOPIC APPENDECTOMY N/A 12/10/2015   Procedure: APPENDECTOMY LAPAROSCOPIC;  Surgeon: Erroll Luna, MD;  Location: [facility];  Service: General;  Laterality: N/A;  . MULTIPLE TOOTH EXTRACTIONS    . TUBAL LIGATION      Family History  Problem Relation Age of Onset  . Hypertension Mother   . Arthritis Mother   . Heart attack Father     In his 51's  . Ovarian cancer Maternal Grandmother   . Colon cancer Neg Hx   . Colon polyps Neg Hx   . Rectal cancer Neg Hx   . Stomach cancer Neg Hx    Social History:  reports that she quit smoking about 4 months ago. Her smoking use included Cigarettes. She has a 21.00 pack-year smoking history. She has never used smokeless tobacco. She reports that she does not drink alcohol or use drugs.  Allergies:  Allergies  Allergen Reactions  . Nicotine     Patch...caused a rash  . Ace Inhibitors Cough    REACTION: Dry cough-lisinopril     Medications Prior to Admission  Medication Sig Dispense Refill  . aspirin EC 81 MG tablet Take 81 mg by mouth daily.    Marland Kitchen atorvastatin (LIPITOR) 40 MG tablet Take 40 mg by mouth daily at 6 PM.     . buPROPion (WELLBUTRIN SR) 150 MG 12 hr tablet Take 1 tablet by mouth daily for 3 days then increase to 1 tablet by  mouth daily. (Patient taking differently: Take 150 mg by mouth 2 (two) times daily. ) 60 tablet 0  . carvedilol (COREG) 3.125 MG tablet Take 3.125 mg by mouth 2 (two) times daily with a meal.    . cholecalciferol (VITAMIN D) 1000 units tablet Take 1,000 Units by mouth daily.    . clonazePAM (KLONOPIN) 0.5 MG tablet Take 0.5-1 tablets (0.25-0.5 mg total) by mouth 2 (two) times daily as needed for anxiety. 30 tablet 0  . fluticasone (FLONASE) 50 MCG/ACT nasal spray Place 2 sprays into both nostrils daily. (Patient taking differently: Place 2 sprays into both nostrils daily as needed for allergies. ) 16 g 0  . losartan (COZAAR) 50 MG tablet Take 50 mg by mouth daily.    . nitroGLYCERIN (NITROSTAT) 0.4 MG SL tablet Place 0.4 mg under the tongue every 5 (five) minutes as needed for chest pain.    . pantoprazole (PROTONIX) 40 MG tablet Take 1 tablet (40 mg total) by mouth daily at 6 (six) AM. 30 tablet 3  . ticagrelor (BRILINTA) 90 MG TABS tablet Take 90 mg by mouth 2 (two) times daily.    Marland Kitchen  topiramate (TOPAMAX) 50 MG tablet Take 50 mg by mouth 2 (two) times daily.    Marland Kitchen acetaminophen (TYLENOL) 500 MG tablet Take 1,000 mg by mouth every 6 (six) hours as needed for moderate pain.    . cetirizine (ZYRTEC) 10 MG tablet Take 10 mg by mouth daily as needed for allergies.    Marland Kitchen triamcinolone (NASACORT) 55 MCG/ACT AERO nasal inhaler Place 2 sprays into the nose daily. (Patient taking differently: Place 1-2 sprays into the nose daily as needed (NASAL CONGESTION). )      Results for orders placed or performed during the hospital encounter of 03/29/17 (from the past 48 hour(s))  Basic metabolic panel     Status: Abnormal   Collection Time: 03/29/17  8:53 AM  Result Value Ref Range   Sodium 141 135 - 145 mmol/L   Potassium 3.7 3.5 - 5.1 mmol/L   Chloride 112 (H) 101 - 111 mmol/L   CO2 21 (L) 22 - 32 mmol/L   Glucose, Bld 132 (H) 65 - 99 mg/dL   BUN 15 6 - 20 mg/dL   Creatinine, Ser 0.81 0.44 - 1.00 mg/dL    Calcium 9.1 8.9 - 10.3 mg/dL   GFR calc non Af Amer >60 >60 mL/min   GFR calc Af Amer >60 >60 mL/min    Comment: (NOTE) The eGFR has been calculated using the CKD EPI equation. This calculation has not been validated in all clinical situations. eGFR's persistently <60 mL/min signify possible Chronic Kidney Disease.    Anion gap 8 5 - 15  CBC     Status: None   Collection Time: 03/29/17  8:53 AM  Result Value Ref Range   WBC 8.8 4.0 - 10.5 K/uL   RBC 4.55 3.87 - 5.11 MIL/uL   Hemoglobin 12.9 12.0 - 15.0 g/dL   HCT 38.4 36.0 - 46.0 %   MCV 84.4 78.0 - 100.0 fL   MCH 28.4 26.0 - 34.0 pg   MCHC 33.6 30.0 - 36.0 g/dL   RDW 15.2 11.5 - 15.5 %   Platelets 238 150 - 400 K/uL  PT- INR Day of Surgery     Status: None   Collection Time: 03/29/17  8:53 AM  Result Value Ref Range   Prothrombin Time 14.1 11.4 - 15.2 seconds   INR 1.08    No results found.  Review of Systems  Constitutional: Negative.   HENT: Negative.   Eyes: Negative.   Respiratory: Negative.   Cardiovascular: Negative.   Skin: Negative.     Blood pressure 133/66, pulse 73, temperature 97.9 F (36.6 C), temperature source Oral, resp. rate 18, height '5\' 4"'  (1.626 m), weight 108.9 kg (240 lb), SpO2 100 %. Physical Exam  Constitutional: She appears well-developed and well-nourished.  HENT:  Head: Normocephalic.  Mouth/Throat: Oropharynx is clear and moist.  Eyes: Conjunctivae are normal. Pupils are equal, round, and reactive to light.  Neck: Normal range of motion. Neck supple.  Cardiovascular: Normal rate.   Respiratory: Effort normal.  GI: Soft.  Musculoskeletal: Normal range of motion.     Assessment/Plan Chronic sinusitis- discussed turbinate reduction and ESS and ready to proceed  Melissa Montane, MD 03/29/2017, 10:48 AM

## 2017-03-29 NOTE — Transfer of Care (Signed)
Immediate Anesthesia Transfer of Care Note  Patient: Theresa Gibson  Procedure(s) Performed: Procedure(s): ENDOSCOPIC SINUS SURGERY WITH NAVIGATION (Bilateral) TURBINATE REDUCTION (Bilateral)  Patient Location: PACU  Anesthesia Type:General  Level of Consciousness: drowsy and patient cooperative  Airway & Oxygen Therapy: Patient Spontanous Breathing and Patient connected to face mask oxygen  Post-op Assessment: Report given to RN, Post -op Vital signs reviewed and stable and Patient moving all extremities X 4  Post vital signs: Reviewed and stable  Last Vitals:  Vitals:   03/29/17 0843  BP: 133/66  Pulse: 73  Resp: 18  Temp: 36.6 C    Last Pain:  Vitals:   03/29/17 0843  TempSrc: Oral      Patients Stated Pain Goal: 5 (51/70/01 7494)  Complications: No apparent anesthesia complications

## 2017-03-29 NOTE — Anesthesia Preprocedure Evaluation (Addendum)
Anesthesia Evaluation  Patient identified by MRN, date of birth, ID band Patient awake    Reviewed: Allergy & Precautions, H&P , NPO status , Patient's Chart, lab work & pertinent test results, reviewed documented beta blocker date and time   History of Anesthesia Complications (+) PONV  Airway Mallampati: III  TM Distance: >3 FB Neck ROM: Full    Dental no notable dental hx. (+) Partial Upper, Dental Advisory Given   Pulmonary neg pulmonary ROS, former smoker,    Pulmonary exam normal breath sounds clear to auscultation       Cardiovascular hypertension, Pt. on medications and Pt. on home beta blockers + Past MI and + Cardiac Stents   Rhythm:Regular Rate:Normal     Neuro/Psych  Headaches, Anxiety Depression negative psych ROS   GI/Hepatic Neg liver ROS, GERD  Medicated and Controlled,  Endo/Other  Morbid obesity  Renal/GU negative Renal ROS  negative genitourinary   Musculoskeletal  (+) Arthritis , Osteoarthritis,    Abdominal   Peds  Hematology negative hematology ROS (+)   Anesthesia Other Findings   Reproductive/Obstetrics negative OB ROS                            Anesthesia Physical Anesthesia Plan  ASA: III  Anesthesia Plan: General   Post-op Pain Management:    Induction: Intravenous  Airway Management Planned: Oral ETT  Additional Equipment:   Intra-op Plan:   Post-operative Plan: Extubation in OR  Informed Consent: I have reviewed the patients History and Physical, chart, labs and discussed the procedure including the risks, benefits and alternatives for the proposed anesthesia with the patient or authorized representative who has indicated his/her understanding and acceptance.   Dental advisory given  Plan Discussed with: CRNA and Anesthesiologist  Anesthesia Plan Comments:        Anesthesia Quick Evaluation

## 2017-03-29 NOTE — Anesthesia Procedure Notes (Signed)
Procedure Name: Intubation Date/Time: 03/29/2017 11:20 AM Performed by: Julieta Bellini Pre-anesthesia Checklist: Patient identified, Emergency Drugs available, Suction available and Patient being monitored Patient Re-evaluated:Patient Re-evaluated prior to inductionOxygen Delivery Method: Circle system utilized Preoxygenation: Pre-oxygenation with 100% oxygen Intubation Type: IV induction Ventilation: Mask ventilation without difficulty Laryngoscope Size: Mac and 3 Grade View: Grade I Tube type: Oral Tube size: 7.0 mm Number of attempts: 1 Airway Equipment and Method: Stylet Placement Confirmation: ETT inserted through vocal cords under direct vision,  positive ETCO2 and breath sounds checked- equal and bilateral Secured at: 22 cm Tube secured with: Tape Dental Injury: Teeth and Oropharynx as per pre-operative assessment

## 2017-03-29 NOTE — Anesthesia Postprocedure Evaluation (Signed)
Anesthesia Post Note  Patient: Theresa Gibson  Procedure(s) Performed: Procedure(s) (LRB): ENDOSCOPIC SINUS SURGERY WITH NAVIGATION (Bilateral) TURBINATE REDUCTION (Bilateral)  Patient location during evaluation: PACU Anesthesia Type: General Level of consciousness: awake and alert Pain management: pain level controlled Vital Signs Assessment: post-procedure vital signs reviewed and stable Respiratory status: spontaneous breathing, nonlabored ventilation and respiratory function stable Cardiovascular status: blood pressure returned to baseline and stable Postop Assessment: no signs of nausea or vomiting Anesthetic complications: no       Last Vitals:  Vitals:   03/29/17 1302 03/29/17 1317  BP: (!) 144/76 (!) 152/90  Pulse: 71 75  Resp: 18 (!) 22  Temp:      Last Pain:  Vitals:   03/29/17 1326  TempSrc:   PainSc: 4                  Breiana Stratmann,W. EDMOND

## 2017-03-29 NOTE — Discharge Instructions (Signed)
Start taking the blood thinner per your cardiologist instructions

## 2017-03-30 ENCOUNTER — Encounter (HOSPITAL_COMMUNITY): Payer: Self-pay | Admitting: Otolaryngology

## 2017-03-30 LAB — HIV ANTIBODY (ROUTINE TESTING W REFLEX): HIV SCREEN 4TH GENERATION: NONREACTIVE

## 2017-04-01 ENCOUNTER — Telehealth: Payer: Self-pay | Admitting: *Deleted

## 2017-04-01 MED ORDER — BUPROPION HCL ER (SR) 150 MG PO TB12: 150.0000 mg | ORAL_TABLET | Freq: Two times a day (BID) | ORAL | 0 refills | Status: DC

## 2017-04-01 NOTE — Telephone Encounter (Signed)
Left msg on triage needing refills on her wellbrutrin. Sent 30 day pt is due for follow-up...[name]

## 2017-05-31 ENCOUNTER — Other Ambulatory Visit: Payer: Self-pay | Admitting: *Deleted

## 2017-05-31 MED ORDER — BUPROPION HCL ER (SR) 150 MG PO TB12: 150.0000 mg | ORAL_TABLET | Freq: Two times a day (BID) | ORAL | 0 refills | Status: DC

## 2017-05-31 NOTE — Telephone Encounter (Signed)
30 day supplied - must keep appointment on 7/9 for ANY additional refills.

## 2017-05-31 NOTE — Telephone Encounter (Signed)
Patient has scheduled for 7/9 .  Patient would like to know if [name] would send enough pills to get her through until this date?

## 2017-05-31 NOTE — Telephone Encounter (Signed)
Pt was transferred to triage left msg requesting refills on her Wellbutrin. Per chart pt is overdue for appt she was given #30 pills back on 04/01/17, and was advise to make appt. Pt must see [name] for refills pls call make appt...[name]

## 2017-06-08 ENCOUNTER — Other Ambulatory Visit: Payer: Self-pay | Admitting: Family

## 2017-06-10 ENCOUNTER — Ambulatory Visit: Payer: Self-pay | Admitting: Family

## 2017-06-10 NOTE — Telephone Encounter (Signed)
Please advise as you have not filled for pt before.

## 2017-06-12 ENCOUNTER — Ambulatory Visit: Payer: Self-pay | Admitting: Family

## 2017-06-17 ENCOUNTER — Encounter: Payer: Self-pay | Admitting: Family

## 2017-06-17 ENCOUNTER — Ambulatory Visit (INDEPENDENT_AMBULATORY_CARE_PROVIDER_SITE_OTHER): Payer: BLUE CROSS/BLUE SHIELD | Admitting: Family

## 2017-06-17 VITALS — BP 112/72 | HR 70 | Temp 98.2°F | Resp 16 | Ht 64.0 in | Wt 246.0 lb

## 2017-06-17 DIAGNOSIS — Z6841 Body Mass Index (BMI) 40.0 and over, adult: Secondary | ICD-10-CM

## 2017-06-17 DIAGNOSIS — L299 Pruritus, unspecified: Secondary | ICD-10-CM | POA: Diagnosis not present

## 2017-06-17 MED ORDER — NALTREXONE-BUPROPION HCL ER 8-90 MG PO TB12: ORAL_TABLET | ORAL | 3 refills | Status: DC

## 2017-06-17 MED ORDER — HYDROXYZINE HCL 25 MG PO TABS: 25.0000 mg | ORAL_TABLET | Freq: Three times a day (TID) | ORAL | 0 refills | Status: DC | PRN

## 2017-06-17 NOTE — Patient Instructions (Addendum)
Thank you for choosing [facility].  SUMMARY AND INSTRUCTIONS:  Please check the price of Contrave.  Continue to work on nutrition and physical activity.   Recommend a caloric intake of about 1200-1500 calories per day. Emphasized on protein and lower in saturated fats and processed sugary foods.  Mediterranean style food choices.   Hydroxyzine as needed for itching.   Medication:  Your prescription(s) have been submitted to your pharmacy or been printed and provided for you. Please take as directed and contact our office if you believe you are having problem(s) with the medication(s) or have any questions.    Follow up:  If your symptoms worsen or fail to improve, please contact our office for further instruction, or in case of emergency go directly to the emergency room at the closest medical facility.      Why follow it? Research shows. . Those who follow the Mediterranean diet have a reduced risk of heart disease  . The diet is associated with a reduced incidence of Parkinson's and Alzheimer's diseases . People following the diet may have longer life expectancies and lower rates of chronic diseases  . The Dietary Guidelines for Americans recommends the Mediterranean diet as an eating plan to promote health and prevent disease  What Is the Mediterranean Diet?  . Healthy eating plan based on typical foods and recipes of Mediterranean-style cooking . The diet is primarily a plant based diet; these foods should make up a majority of meals   Starches - Plant based foods should make up a majority of meals - They are an important sources of vitamins, minerals, energy, antioxidants, and fiber - Choose whole grains, foods high in fiber and minimally processed items  - Typical grain sources include wheat, oats, barley, corn, brown rice, bulgar, farro, millet, polenta, couscous  - Various types of beans include chickpeas, lentils, fava beans, black beans, white beans   Fruits   Veggies - Large quantities of antioxidant rich fruits & veggies; 6 or more servings  - Vegetables can be eaten raw or lightly drizzled with oil and cooked  - Vegetables common to the traditional Mediterranean Diet include: artichokes, arugula, beets, broccoli, brussel sprouts, cabbage, carrots, celery, collard greens, cucumbers, eggplant, kale, leeks, lemons, lettuce, mushrooms, okra, onions, peas, peppers, potatoes, pumpkin, radishes, rutabaga, shallots, spinach, sweet potatoes, turnips, zucchini - Fruits common to the Mediterranean Diet include: apples, apricots, avocados, cherries, clementines, dates, figs, grapefruits, grapes, melons, nectarines, oranges, peaches, pears, pomegranates, strawberries, tangerines  Fats - Replace butter and margarine with healthy oils, such as olive oil, canola oil, and tahini  - Limit nuts to no more than a handful a day  - Nuts include walnuts, almonds, pecans, pistachios, pine nuts  - Limit or avoid candied, honey roasted or heavily salted nuts - Olives are central to the [employer] - can be eaten whole or used in a variety of dishes   Meats Protein - Limiting red meat: no more than a few times a month - When eating red meat: choose lean cuts and keep the portion to the size of deck of cards - Eggs: approx. 0 to 4 times a week  - Fish and lean poultry: at least 2 a week  - Healthy protein sources include, chicken, [place], lean beef, lamb - Increase intake of seafood such as tuna, salmon, trout, mackerel, shrimp, scallops - Avoid or limit high fat processed meats such as sausage and bacon  Dairy - Include moderate amounts of low fat dairy products  -  Focus on healthy dairy such as fat free yogurt, skim milk, low or reduced fat cheese - Limit dairy products higher in fat such as whole or 2% milk, cheese, ice cream  Alcohol - Moderate amounts of red wine is ok  - No more than 5 oz daily for women (all ages) and men older than age 17  - No more than 10 oz  of wine daily for men younger than 44  Other - Limit sweets and other desserts  - Use herbs and spices instead of salt to flavor foods  - Herbs and spices common to the traditional Mediterranean Diet include: basil, bay leaves, chives, cloves, cumin, fennel, garlic, lavender, marjoram, mint, oregano, parsley, pepper, rosemary, sage, savory, sumac, tarragon, thyme   It's not just a diet, it's a lifestyle:  . The Mediterranean diet includes lifestyle factors typical of those in the region  . Foods, drinks and meals are best eaten with others and savored . Daily physical activity is important for overall good health . This could be strenuous exercise like running and aerobics . This could also be more leisurely activities such as walking, housework, yard-work, or taking the stairs . Moderation is the key; a balanced and healthy diet accommodates most foods and drinks . Consider portion sizes and frequency of consumption of certain foods   Meal Ideas & Options:  . Breakfast:  o Whole wheat toast or whole wheat English muffins with peanut butter & hard boiled egg o Steel cut oats topped with apples & cinnamon and skim milk  o Fresh fruit: banana, strawberries, melon, berries, peaches  o Smoothies: strawberries, bananas, greek yogurt, peanut butter o Low fat greek yogurt with blueberries and granola  o Egg white omelet with spinach and mushrooms o Breakfast couscous: whole wheat couscous, apricots, skim milk, cranberries  . Sandwiches:  o Hummus and grilled vegetables (peppers, zucchini, squash) on whole wheat bread   o Grilled chicken on whole wheat pita with lettuce, tomatoes, cucumbers or tzatziki  o Tuna salad on whole wheat bread: tuna salad made with greek yogurt, olives, red peppers, capers, green onions o Garlic rosemary lamb pita: lamb sauted with garlic, rosemary, salt & pepper; add lettuce, cucumber, greek yogurt to pita - flavor with lemon juice and black pepper  . Seafood:   o Mediterranean grilled salmon, seasoned with garlic, basil, parsley, lemon juice and black pepper o Shrimp, lemon, and spinach whole-grain pasta salad made with low fat greek yogurt  o Seared scallops with lemon orzo  o Seared tuna steaks seasoned salt, pepper, coriander topped with tomato mixture of olives, tomatoes, olive oil, minced garlic, parsley, green onions and cappers  . Meats:  o Herbed greek chicken salad with kalamata olives, cucumber, feta  o Red bell peppers stuffed with spinach, bulgur, lean ground beef (or lentils) & topped with feta   o Kebabs: skewers of chicken, tomatoes, onions, zucchini, squash  o [place] burgers: made with red onions, mint, dill, lemon juice, feta cheese topped with roasted red peppers . Vegetarian o Cucumber salad: cucumbers, artichoke hearts, celery, red onion, feta cheese, tossed in olive oil & lemon juice  o Hummus and whole grain pita points with a greek salad (lettuce, tomato, feta, olives, cucumbers, red onion) o Lentil soup with celery, carrots made with vegetable broth, garlic, salt and pepper  o Tabouli salad: parsley, bulgur, mint, scallions, cucumbers, tomato, radishes, lemon juice, olive oil, salt and pepper.

## 2017-06-17 NOTE — Assessment & Plan Note (Signed)
BMI of 42.23. Recommend weight loss of 5-10% of current body weight through nutrition and physical activity. Previous success with Weight Watchers, nutrition and physical activity. Wean off Wellbutrin and start Contrave. Encouraged 1200-1500 calories per day with goal physical activity of 30 minutes or 10,000 steps. Consider referral to obesity medicine or bariatric medicine unsuccessful.

## 2017-06-17 NOTE — Assessment & Plan Note (Signed)
New-onset itching with no significant cause or findings on physical exam. Start hydroxyzine as needed for itching. Continue moisturization with Dove, Aveeno, or Eucerin products. Follow-up if symptoms worsen or do not improve.

## 2017-06-17 NOTE — Progress Notes (Signed)
Subjective:    Patient ID: Theresa Gibson, female    DOB: January 22, 1956, 61 y.o.   MRN: [id number]  Chief Complaint  Patient presents with  . Medication Management    wants to get on something for weight loss, itching all the time    HPI:  Theresa Gibson is a 61 y.o. female who  has a past medical history of Acute MI, inferior wall ([place]) (08/2016); Allergy; Arthritis; Depression; GERD (gastroesophageal reflux disease); Hypertension; Meningioma ([place]); PONV (postoperative nausea and vomiting); and Wears dentures. and presents today for an acute office visit.   1.) Weight issues - Associated symptom of unintended weight gain has been going on for several months. Has previously been on phentermine which provided a little weight loss. She has also tried Weight Watchers in the past for about 6 months and did have some weight loss and continues to use the principles with the program. Most successful with generalized nutrition and physical activity. Has struggled with weight issues over the past several years. Activity is decreased secondary to previous myocardial infaraction.   Wt Readings from Last 3 Encounters:  06/17/17 246 lb (111.6 kg)  03/29/17 240 lb (108.9 kg)  03/01/17 241 lb 14.4 oz (109.7 kg)    2.) Itching - Associated symptom of itching has been going on for a couple of weeks. Itching occurs has occurred at multiple areas include the trunk and arms. Modifying factors include changing soaps which did not help. Did not have any other changes in skin or laundering products. Everything that she uses is hypoallergenic.   Allergies  Allergen Reactions  . Nicotine     Patch...caused a rash  . Ace Inhibitors Cough    REACTION: Dry cough-lisinopril       Outpatient Medications Prior to Visit  Medication Sig Dispense Refill  . acetaminophen (TYLENOL) 500 MG tablet Take 1,000 mg by mouth every 6 (six) hours as needed for moderate pain.    Marland Kitchen aspirin EC 81 MG tablet Take 81 mg by mouth  daily.    Marland Kitchen atorvastatin (LIPITOR) 40 MG tablet Take 40 mg by mouth daily at 6 PM.     . buPROPion (WELLBUTRIN SR) 150 MG 12 hr tablet Take 1 tablet (150 mg total) by mouth 2 (two) times daily. 60 tablet 0  . carvedilol (COREG) 3.125 MG tablet Take 3.125 mg by mouth 2 (two) times daily with a meal.    . cephALEXin (KEFLEX) 500 MG capsule Take 1 capsule (500 mg total) by mouth 3 (three) times daily. 15 capsule 0  . cetirizine (ZYRTEC) 10 MG tablet Take 10 mg by mouth daily as needed for allergies.    . cholecalciferol (VITAMIN D) 1000 units tablet Take 1,000 Units by mouth daily.    . clonazePAM (KLONOPIN) 0.5 MG tablet Take 0.5-1 tablets (0.25-0.5 mg total) by mouth 2 (two) times daily as needed for anxiety. 30 tablet 0  . fluticasone (FLONASE) 50 MCG/ACT nasal spray Place 2 sprays into both nostrils daily. (Patient taking differently: Place 2 sprays into both nostrils daily as needed for allergies. ) 16 g 0  . losartan (COZAAR) 50 MG tablet Take 50 mg by mouth daily.    . nitroGLYCERIN (NITROSTAT) 0.4 MG SL tablet Place 0.4 mg under the tongue every 5 (five) minutes as needed for chest pain.    . pantoprazole (PROTONIX) 40 MG tablet Take 1 tablet (40 mg total) by mouth daily at 6 (six) AM. 30 tablet 3  . ticagrelor (BRILINTA)  90 MG TABS tablet Take 90 mg by mouth 2 (two) times daily.    Marland Kitchen topiramate (TOPAMAX) 50 MG tablet TAKE 1 TAB BY MOUTH TWICE DAILY 180 tablet 2  . triamcinolone (NASACORT) 55 MCG/ACT AERO nasal inhaler Place 2 sprays into the nose daily. (Patient taking differently: Place 1-2 sprays into the nose daily as needed (NASAL CONGESTION). )    . HYDROcodone-acetaminophen (LORTAB) 5-325 MG tablet Take 1 tablet by mouth every 6 (six) hours as needed for moderate pain. 30 tablet 0   Facility-Administered Medications Prior to Visit  Medication Dose Route Frequency Provider Last Rate Last Dose  . lactated ringers infusion    Continuous PRN Harden Mo, CRNA          Past Surgical  History:  Procedure Laterality Date  . ABDOMINAL HYSTERECTOMY     Total   . BREAST SURGERY     BX right breast  . COLONOSCOPY W/ BIOPSIES AND POLYPECTOMY    . CORONARY ANGIOPLASTY WITH STENT PLACEMENT    . KNEE SURGERY Bilateral    arthroscopy  . LAPAROSCOPIC APPENDECTOMY N/A 12/10/2015   Procedure: APPENDECTOMY LAPAROSCOPIC;  Surgeon: Erroll Luna, MD;  Location: [facility];  Service: General;  Laterality: N/A;  . MULTIPLE TOOTH EXTRACTIONS    . NASAL/SINUS ENDOSCOPY Bilateral 03/29/2017   ENDOSCOPIC SINUS SURGERY WITH NAVIGATION (Bilateral)  . SINUS ENDO W/FUSION Bilateral 03/29/2017   Procedure: ENDOSCOPIC SINUS SURGERY WITH NAVIGATION;  Surgeon: Melissa Montane, MD;  Location: [facility];  Service: ENT;  Laterality: Bilateral;  . TUBAL LIGATION    . TURBINATE REDUCTION Bilateral 03/29/2017   Procedure: TURBINATE REDUCTION;  Surgeon: Melissa Montane, MD;  Location: [facility];  Service: ENT;  Laterality: Bilateral;      Past Medical History:  Diagnosis Date  . Acute MI, inferior wall ([place]) 08/2016   s/p DES x 2 to the RCA  . Allergy   . Arthritis   . Depression   . GERD (gastroesophageal reflux disease)   . Hypertension   . Meningioma ([place])   . PONV (postoperative nausea and vomiting)    nausea only  . Wears dentures    upper      Review of Systems  Constitutional: Positive for unexpected weight change. Negative for chills and fever.  Respiratory: Negative for chest tightness and shortness of breath.   Cardiovascular: Negative for chest pain, palpitations and leg swelling.  Skin:       Positive for itching.   Neurological: Negative for weakness and numbness.      Objective:    BP 112/72 (BP Location: Left Arm, Patient Position: Sitting, Cuff Size: Large)   Pulse 70   Temp 98.2 F (36.8 C) (Oral)   Resp 16   Ht 5\' 4"  (1.626 m)   Wt 246 lb (111.6 kg)   SpO2 97%   BMI 42.23 kg/m  Nursing note and vital signs reviewed.  Physical Exam  Constitutional: She is oriented to person,  place, and time. She appears well-developed and well-nourished. No distress.  Cardiovascular: Normal rate, regular rhythm, normal heart sounds and intact distal pulses.   Pulmonary/Chest: Effort normal and breath sounds normal.  Neurological: She is alert and oriented to person, place, and time.  Skin: Skin is warm and dry.  No rashes or skin outbreaks present.   Psychiatric: She has a normal mood and affect. Her behavior is normal. Judgment and thought content normal.       Assessment & Plan:   Problem List Items Addressed  This Visit      Other   Obesity - Primary    BMI of 42.23. Recommend weight loss of 5-10% of current body weight through nutrition and physical activity. Previous success with Weight Watchers, nutrition and physical activity. Wean off Wellbutrin and start Contrave. Encouraged 1200-1500 calories per day with goal physical activity of 30 minutes or 10,000 steps. Consider referral to obesity medicine or bariatric medicine unsuccessful.       Relevant Medications   Naltrexone-Bupropion HCl ER (CONTRAVE) 8-90 MG TB12   Itching    New-onset itching with no significant cause or findings on physical exam. Start hydroxyzine as needed for itching. Continue moisturization with Dove, Aveeno, or Eucerin products. Follow-up if symptoms worsen or do not improve.          I have discontinued Ms. Brimley's HYDROcodone-acetaminophen. I am also having her start on Naltrexone-Bupropion HCl ER and hydrOXYzine. Additionally, I am having her maintain her aspirin EC, atorvastatin, carvedilol, nitroGLYCERIN, ticagrelor, clonazePAM, triamcinolone, losartan, cetirizine, acetaminophen, cholecalciferol, pantoprazole, fluticasone, cephALEXin, buPROPion, and topiramate.   Meds ordered this encounter  Medications  . Naltrexone-Bupropion HCl ER (CONTRAVE) 8-90 MG TB12    Sig: Take 1 tablet by mouth daily x 1 week; then 1 tablet twice daily x 1 week; then 2 tablets in the morning and 1 in the  evening x 1 week; then 2 tablets twice daily.    Dispense:  120 tablet    Refill:  3    Order Specific Question:   Supervising Provider    Answer:   Pricilla Holm A [[id number]]  . hydrOXYzine (ATARAX/VISTARIL) 25 MG tablet    Sig: Take 1 tablet (25 mg total) by mouth 3 (three) times daily as needed for itching.    Dispense:  60 tablet    Refill:  0    Order Specific Question:   Supervising Provider    Answer:   Pricilla Holm A [[id number]]     Follow-up: Return in about 1 month (around 07/18/2017), or if symptoms worsen or fail to improve.  Mauricio Po, FNP

## 2017-06-18 ENCOUNTER — Telehealth: Payer: Self-pay | Admitting: Family

## 2017-06-18 NOTE — Telephone Encounter (Signed)
Pt called stating that the prescription for Naltrexone-Bupropion HCl ER (CONTRAVE) 8-90 MG TB12 is not covered by insurance. Can we start a PA or is there something else that she could try?

## 2017-06-19 NOTE — Telephone Encounter (Signed)
Can try a prior authorization first and if unsuccessful will change medication.

## 2017-06-24 NOTE — Telephone Encounter (Signed)
Will you please start PA for contrave for me. Thank you so much

## 2017-07-17 NOTE — Telephone Encounter (Signed)
Please have her check on coverage for Saxenda or Belviq

## 2017-07-17 NOTE — Telephone Encounter (Signed)
Pt called in to check this status of this PA or could she be give something else?

## 2017-07-17 NOTE — Telephone Encounter (Signed)
Did PA for Contrave and insurance will not cover. Please advise on alternatives

## 2017-07-18 NOTE — Telephone Encounter (Signed)
Called pt to let her know response below. Will check with insurance and call back with what is covered.

## 2017-08-22 ENCOUNTER — Telehealth: Payer: Self-pay | Admitting: Family

## 2017-08-22 MED ORDER — TOPIRAMATE 50 MG PO TABS: ORAL_TABLET | ORAL | 0 refills | Status: DC

## 2017-08-22 MED ORDER — BUPROPION HCL ER (SR) 150 MG PO TB12: 150.0000 mg | ORAL_TABLET | Freq: Two times a day (BID) | ORAL | 0 refills | Status: DC

## 2017-08-22 NOTE — Telephone Encounter (Signed)
Pt called for a refill of her buPROPion (WELLBUTRIN SR) 150 MG 12 hr tablet and topiramate (TOPAMAX) 50 MG tablet Please send to  [facility], [place] Please advise

## 2017-08-22 NOTE — Telephone Encounter (Signed)
Reviewed chart pt is up-to-date sent refills to requested pharmacy.../lmb  

## 2017-09-27 ENCOUNTER — Emergency Department (HOSPITAL_BASED_OUTPATIENT_CLINIC_OR_DEPARTMENT_OTHER)
Admission: EM | Admit: 2017-09-27 | Discharge: 2017-09-27 | Disposition: A | Discharge status: Home / Self Care | Payer: BLUE CROSS/BLUE SHIELD | Attending: Emergency Medicine | Admitting: Emergency Medicine

## 2017-09-27 ENCOUNTER — Encounter (HOSPITAL_BASED_OUTPATIENT_CLINIC_OR_DEPARTMENT_OTHER): Payer: Self-pay

## 2017-09-27 ENCOUNTER — Emergency Department (HOSPITAL_BASED_OUTPATIENT_CLINIC_OR_DEPARTMENT_OTHER): Payer: BLUE CROSS/BLUE SHIELD

## 2017-09-27 DIAGNOSIS — Z79899 Other long term (current) drug therapy: Secondary | ICD-10-CM | POA: Diagnosis not present

## 2017-09-27 DIAGNOSIS — F419 Anxiety disorder, unspecified: Secondary | ICD-10-CM | POA: Insufficient documentation

## 2017-09-27 DIAGNOSIS — I10 Essential (primary) hypertension: Secondary | ICD-10-CM | POA: Insufficient documentation

## 2017-09-27 DIAGNOSIS — I252 Old myocardial infarction: Secondary | ICD-10-CM | POA: Diagnosis not present

## 2017-09-27 DIAGNOSIS — F329 Major depressive disorder, single episode, unspecified: Secondary | ICD-10-CM | POA: Diagnosis not present

## 2017-09-27 DIAGNOSIS — Z7982 Long term (current) use of aspirin: Secondary | ICD-10-CM | POA: Insufficient documentation

## 2017-09-27 DIAGNOSIS — M542 Cervicalgia: Secondary | ICD-10-CM | POA: Insufficient documentation

## 2017-09-27 DIAGNOSIS — F17210 Nicotine dependence, cigarettes, uncomplicated: Secondary | ICD-10-CM | POA: Diagnosis not present

## 2017-09-27 MED ORDER — METHOCARBAMOL 500 MG PO TABS: 1000.0000 mg | ORAL_TABLET | Freq: Once | ORAL | Status: DC

## 2017-09-27 MED ORDER — METHOCARBAMOL 500 MG PO TABS: 500.0000 mg | ORAL_TABLET | Freq: Four times a day (QID) | ORAL | 0 refills | Status: DC

## 2017-09-27 NOTE — Discharge Instructions (Signed)
Please read and follow all provided instructions.  Your diagnoses today include:  1. Neck pain    Tests performed today include:  An x-ray of the affected area - does NOT show any broken bones, shows some narrowing between the vertabra and muscle spasm  Vital signs. See below for your results today.   Medications prescribed:   Robaxin (methocarbamol) - muscle relaxer medication  DO NOT drive or perform any activities that require you to be awake and alert because this medicine can make you drowsy.   Take any prescribed medications only as directed.  Home care instructions:   Follow any educational materials contained in this packet  Follow R.I.C.E. Protocol:  R - rest your injury   I  - use ice on injury without applying directly to skin  C - compress injury with bandage or splint  E - elevate the injury as much as possible  Follow-up instructions: Please follow-up with your primary care provider as planned.    Return instructions:   Please return if your fingers are numb or tingling, appear gray or blue, or you have severe pain (also elevate the arm and loosen splint or wrap if you were given one)  Please return to the Emergency Department if you experience worsening symptoms.   Please return if you have any other emergent concerns.  Additional Information:  Your vital signs today were: BP 138/82 (BP Location: Right Arm)    Pulse 84    Temp 98.1 F (36.7 C) (Oral)    Resp 20    Ht 5\' 4"  (1.626 m)    Wt 108.9 kg (240 lb)    SpO2 100%    BMI 41.20 kg/m  If your blood pressure (BP) was elevated above 135/85 this visit, please have this repeated by your doctor within one month. --------------

## 2017-09-27 NOTE — ED Provider Notes (Signed)
[facility] EMERGENCY DEPARTMENT Provider Note   CSN: [id number] Arrival date & time: 09/27/17  2109     History   Chief Complaint Chief Complaint  Patient presents with  . Neck Pain    HPI Theresa Gibson is a 61 y.o. female.  Patient with history of MI status post stenting, presents with 2-3 months of intermittent right-sided neck pain described as a shooting pain.  The pain does not occur every day but when it does occur it lasts for several minutes before resolving.  The pain does not move into the arm.  She has not had any upper extremity numbness, tingling, or weakness.  No lower extremity symptoms.  No slurred speech or facial droop.  No vision loss or change.  Sometimes the pain will cause a posterior headache.  No nausea or vomiting.  No chest pain or shortness of breath associated with this.  Patient denies any history of disc or neck issues.  Pain is not made worse or triggered by movement or palpation. The onset of this condition was acute. The course is intermittent, currently resolved. Aggravating factors: none. Alleviating factors: none.        Past Medical History:  Diagnosis Date  . Acute MI, inferior wall ([place]) 08/2016   s/p DES x 2 to the RCA  . Allergy   . Arthritis   . Depression   . GERD (gastroesophageal reflux disease)   . Hypertension   . Meningioma ([place])   . PONV (postoperative nausea and vomiting)    nausea only  . Wears dentures    upper    Patient Active Problem List   Diagnosis Date Noted  . Itching 06/17/2017  . Chronic sinusitis 03/29/2017  . Chest pain, moderate coronary artery risk 02/28/2017  . Chest pain 02/28/2017  . Situational anxiety 11/19/2016  . Myocardial infarction ([place]) 11/19/2016  . Cloudy urine 07/02/2016  . Acute appendicitis 12/10/2015  . Vitamin D deficiency 09/13/2015  . Pain in joint, shoulder region 07/26/2015  . Generalized headaches 04/21/2015  . Osteoarthritis 04/21/2015  . Routine general  medical examination at a health care facility 09/21/2014  . Decreased libido 09/21/2014  . Palpitations 09/07/2014  . SCIATICA, ACUTE 03/21/2009  . HEAD TRAUMA, CLOSED 03/05/2008  . CARPAL TUNNEL SYNDROME, BILATERAL 12/03/2007  . SYMPTOM, HYPERSOMNIA W/SLEEP APNEA NOS 06/24/2007  . CAVERNOUS HEMANGIOMA 04/18/2007  . MALAISE AND FATIGUE 03/27/2007  . Hyperlipidemia with target LDL less than 70 12/10/2006  . Obesity 12/10/2006  . DISORDER, TOBACCO USE 12/10/2006  . Depression 12/10/2006  . Essential hypertension 12/10/2006  . SINUSITIS, CHRONIC NOS 12/10/2006  . ALLERGIC RHINITIS 12/10/2006  . GERD 12/10/2006    Past Surgical History:  Procedure Laterality Date  . ABDOMINAL HYSTERECTOMY     Total   . BREAST SURGERY     BX right breast  . COLONOSCOPY W/ BIOPSIES AND POLYPECTOMY    . CORONARY ANGIOPLASTY WITH STENT PLACEMENT    . KNEE SURGERY Bilateral    arthroscopy  . LAPAROSCOPIC APPENDECTOMY N/A 12/10/2015   Procedure: APPENDECTOMY LAPAROSCOPIC;  Surgeon: Erroll Luna, MD;  Location: [facility];  Service: General;  Laterality: N/A;  . MULTIPLE TOOTH EXTRACTIONS    . NASAL/SINUS ENDOSCOPY Bilateral 03/29/2017   ENDOSCOPIC SINUS SURGERY WITH NAVIGATION (Bilateral)  . SINUS ENDO W/FUSION Bilateral 03/29/2017   Procedure: ENDOSCOPIC SINUS SURGERY WITH NAVIGATION;  Surgeon: Melissa Montane, MD;  Location: [facility];  Service: ENT;  Laterality: Bilateral;  . TUBAL LIGATION    .  TURBINATE REDUCTION Bilateral 03/29/2017   Procedure: TURBINATE REDUCTION;  Surgeon: Melissa Montane, MD;  Location: [facility];  Service: ENT;  Laterality: Bilateral;    OB History    No data available       Home Medications    Prior to Admission medications   Medication Sig Start Date End Date Taking? Authorizing Provider  acetaminophen (TYLENOL) 500 MG tablet Take 1,000 mg by mouth every 6 (six) hours as needed for moderate pain.    [provider]  aspirin EC 81 MG tablet Take 81 mg by mouth daily.     [provider]  atorvastatin (LIPITOR) 40 MG tablet Take 40 mg by mouth daily at 6 PM.     [provider]  buPROPion (WELLBUTRIN SR) 150 MG 12 hr tablet Take 1 tablet (150 mg total) by mouth 2 (two) times daily. 08/22/17   Golden Circle, FNP  carvedilol (COREG) 3.125 MG tablet Take 3.125 mg by mouth 2 (two) times daily with a meal.    [provider]  cephALEXin (KEFLEX) 500 MG capsule Take 1 capsule (500 mg total) by mouth 3 (three) times daily. 03/29/17   Melissa Montane, MD  cetirizine (ZYRTEC) 10 MG tablet Take 10 mg by mouth daily as needed for allergies.    [provider]  cholecalciferol (VITAMIN D) 1000 units tablet Take 1,000 Units by mouth daily.    [provider]  clonazePAM (KLONOPIN) 0.5 MG tablet Take 0.5-1 tablets (0.25-0.5 mg total) by mouth 2 (two) times daily as needed for anxiety. 11/19/16   Golden Circle, FNP  fluticasone (FLONASE) 50 MCG/ACT nasal spray Place 2 sprays into both nostrils daily. Patient taking differently: Place 2 sprays into both nostrils daily as needed for allergies.  03/16/17   Palumbo, April, MD  hydrOXYzine (ATARAX/VISTARIL) 25 MG tablet Take 1 tablet (25 mg total) by mouth 3 (three) times daily as needed for itching. 06/17/17   Golden Circle, FNP  losartan (COZAAR) 50 MG tablet Take 50 mg by mouth daily.    [provider]  methocarbamol (ROBAXIN) 500 MG tablet Take 1 tablet (500 mg total) by mouth 4 (four) times daily. 09/27/17   Carlisle Cater, PA-C  Naltrexone-Bupropion HCl ER (CONTRAVE) 8-90 MG TB12 Take 1 tablet by mouth daily x 1 week; then 1 tablet twice daily x 1 week; then 2 tablets in the morning and 1 in the evening x 1 week; then 2 tablets twice daily. 06/17/17   Golden Circle, FNP  nitroGLYCERIN (NITROSTAT) 0.4 MG SL tablet Place 0.4 mg under the tongue every 5 (five) minutes as needed for chest pain.    [provider]  pantoprazole (PROTONIX) 40 MG tablet Take 1 tablet  (40 mg total) by mouth daily at 6 (six) AM. 03/02/17   Bhagat, Bhavinkumar, PA  ticagrelor (BRILINTA) 90 MG TABS tablet Take 90 mg by mouth 2 (two) times daily.    [provider]  topiramate (TOPAMAX) 50 MG tablet TAKE 1 TAB BY MOUTH TWICE DAILY 08/22/17   Golden Circle, FNP  triamcinolone (NASACORT) 55 MCG/ACT AERO nasal inhaler Place 2 sprays into the nose daily. Patient taking differently: Place 1-2 sprays into the nose daily as needed (NASAL CONGESTION).  01/04/17   Molpus, John, MD    Family History Family History  Problem Relation Age of Onset  . Hypertension Mother   . Arthritis Mother   . Heart attack Father        In his  9's  . Ovarian cancer Maternal Grandmother   . Colon cancer Neg Hx   . Colon polyps Neg Hx   . Rectal cancer Neg Hx   . Stomach cancer Neg Hx     Social History Social History  Substance Use Topics  . Smoking status: Current Every Day Smoker    Packs/day: 0.50    Years: 42.00    Types: Cigarettes  . Smokeless tobacco: Never Used  . Alcohol use 0.0 oz/week     Comment: occ     Allergies   Nicotine and Ace inhibitors   Review of Systems Review of Systems  Constitutional: Negative for fever and unexpected weight change.  Gastrointestinal: Negative for constipation.       Negative for fecal incontinence.   Genitourinary: Negative for dysuria, flank pain, hematuria, pelvic pain, vaginal bleeding and vaginal discharge.       Negative for urinary incontinence or retention.  Musculoskeletal: Positive for neck pain. Negative for back pain and myalgias.  Neurological: Positive for headaches. Negative for weakness and numbness.       Denies saddle paresthesias.     Physical Exam Updated Vital Signs BP 128/78   Pulse 84   Temp 98.1 F (36.7 C) (Oral)   Resp 19   Ht 5\' 4"  (1.626 m)   Wt 108.9 kg (240 lb)   SpO2 99%   BMI 41.20 kg/m   Physical Exam  Constitutional: She is oriented to person, place, and time. She appears  well-developed and well-nourished.  HENT:  Head: Normocephalic and atraumatic.  Right Ear: Tympanic membrane, external ear and ear canal normal.  Left Ear: Tympanic membrane, external ear and ear canal normal.  Nose: Nose normal.  Mouth/Throat: Uvula is midline, oropharynx is clear and moist and mucous membranes are normal.  Eyes: Pupils are equal, round, and reactive to light. Conjunctivae, EOM and lids are normal. Right eye exhibits no nystagmus. Left eye exhibits no nystagmus.  Neck: Normal range of motion. Neck supple. Carotid bruit is not present.  Cardiovascular: Normal rate and regular rhythm.   Pulmonary/Chest: Effort normal and breath sounds normal.  Abdominal: Soft. There is no tenderness.  Musculoskeletal:       Right shoulder: Normal.       Left shoulder: Normal.       Cervical back: She exhibits normal range of motion, no tenderness and no bony tenderness.       Thoracic back: Normal. She exhibits normal range of motion, no tenderness and no bony tenderness.  Neurological: She is alert and oriented to person, place, and time. She has normal strength and normal reflexes. No cranial nerve deficit or sensory deficit. She displays a negative Romberg sign. Coordination and gait normal. GCS eye subscore is 4. GCS verbal subscore is 5. GCS motor subscore is 6.  Skin: Skin is warm and dry.  Psychiatric: She has a normal mood and affect.  Nursing note and vitals reviewed.    ED Treatments / Results  Labs (all labs ordered are listed, but only abnormal results are displayed) Labs Reviewed - No data to display  EKG  EKG Interpretation None       Radiology Dg Cervical Spine Complete  Result Date: 09/27/2017 CLINICAL DATA:  Right neck pain for the past 3 months, worsening. No known injury. EXAM: CERVICAL SPINE - COMPLETE 4+ VIEW COMPARISON:  None. FINDINGS: Mild reversal of the normal cervical lordosis. Minimal uncinate spur formation on the right at the C4-5 and C5-6 levels  with  minimal foraminal stenosis on the right at the C5-6 level and no significant foraminal stenosis on the right at the C4-5 level. Otherwise, normal appearing bones and soft tissues. IMPRESSION: 1. Mild reversal of the normal cervical lordosis. This can be seen with muscle spasm. 2. Minimal uncinate spur formation on the right at the C4-5 and C5-6 levels with minimal foraminal stenosis on the right at the C5-6 level. Electronically Signed   By: Claudie Revering M.D.   On: 09/27/2017 22:20    Procedures Procedures (including critical care time)  Medications Ordered in ED Medications  methocarbamol (ROBAXIN) tablet 1,000 mg (1,000 mg Oral Not Given 09/27/17 2236)     Initial Impression / Assessment and Plan / ED Course  I have reviewed the triage vital signs and the nursing notes.  Pertinent labs & imaging results that were available during my care of the patient were reviewed by me and considered in my medical decision making (see chart for details).     Patient seen and examined.  Patient asymptomatic at time of exam.  Work-up initiated.   Vital signs reviewed and are as follows: BP 128/78   Pulse 84   Temp 98.1 F (36.7 C) (Oral)   Resp 19   Ht 5\' 4"  (1.626 m)   Wt 108.9 kg (240 lb)   SpO2 99%   BMI 41.20 kg/m   11:16 PM x-ray performed and does show some spurring and foraminal stenosis in the area of C4-C6.  Patient updated on results.  We will discharge to home with muscle relaxer.  Encouraged ice and heat on the area if needed.  Otherwise, patient will follow up with her primary care physician as previously planned in 3 days.  Patient counseled on proper use of muscle relaxant medication.  They were told not to drink alcohol, drive any vehicle, or do any dangerous activities while taking this medication.  Patient verbalized understanding.    Final Clinical Impressions(s) / ED Diagnoses   Final diagnoses:  Neck pain   Patient with intermittent right-sided neck pain  lasting for several minutes at a time.  No chest pain or shortness of breath.  This is not exertional and does not seem to be related to ACS.  Patient describes a shooting pain.  May be related to nerve impingement although she has no radicular symptoms or weakness.  No stroke symptoms.  Given intermittent nature of symptoms, do not suspect arterial dissection, meningismus from bleeding in the brain or infection.  Will treat conservatively and have patient follow-up with her primary care physician for consideration of more advanced imaging or other treatment modalities if not improving.    New Prescriptions Discharge Medication List as of 09/27/2017 10:32 PM    START taking these medications   Details  methocarbamol (ROBAXIN) 500 MG tablet Take 1 tablet (500 mg total) by mouth 4 (four) times daily., Starting Fri 09/27/2017, Print         Carlisle Cater, PA-C 09/27/17 2318    Julianne Rice, MD 09/29/17 [id number]

## 2017-09-27 NOTE — ED Triage Notes (Signed)
C/o pain to right posterior neck pain x 2-3 months-denies injury-NAD-steady gait

## 2017-09-30 ENCOUNTER — Ambulatory Visit (INDEPENDENT_AMBULATORY_CARE_PROVIDER_SITE_OTHER): Payer: BLUE CROSS/BLUE SHIELD | Admitting: Nurse Practitioner

## 2017-09-30 ENCOUNTER — Encounter: Payer: Self-pay | Admitting: Nurse Practitioner

## 2017-09-30 VITALS — BP 110/70 | HR 64 | Temp 97.7°F | Ht 64.0 in | Wt 246.0 lb

## 2017-09-30 DIAGNOSIS — M5432 Sciatica, left side: Secondary | ICD-10-CM | POA: Diagnosis not present

## 2017-09-30 DIAGNOSIS — S161XXD Strain of muscle, fascia and tendon at neck level, subsequent encounter: Secondary | ICD-10-CM | POA: Diagnosis not present

## 2017-09-30 MED ORDER — DICLOFENAC SODIUM 2 % TD SOLN: 1.0000 [in_us] | Freq: Two times a day (BID) | TRANSDERMAL | 0 refills | Status: DC | PRN

## 2017-09-30 MED ORDER — PREDNISONE 10 MG (21) PO TBPK: ORAL_TABLET | ORAL | 0 refills | Status: DC

## 2017-09-30 MED ORDER — ACETAMINOPHEN 500 MG PO TABS: 1000.0000 mg | ORAL_TABLET | Freq: Three times a day (TID) | ORAL | 0 refills | Status: DC | PRN

## 2017-09-30 MED ORDER — TIZANIDINE HCL 4 MG PO CAPS: 4.0000 mg | ORAL_CAPSULE | Freq: Three times a day (TID) | ORAL | 0 refills | Status: DC | PRN

## 2017-09-30 NOTE — Patient Instructions (Signed)
Alternate between warm and cold compress as needed.  Neck Exercises Neck exercises can be important for many reasons:  They can help you to improve and maintain flexibility in your neck. This can be especially important as you age.  They can help to make your neck stronger. This can make movement easier.  They can reduce or prevent neck pain.  They may help your upper back.  Ask your health care provider which neck exercises would be best for you. Exercises Neck Press Repeat this exercise 10 times. Do it first thing in the morning and right before bed or as told by your health care provider. 1. Lie on your back on a firm bed or on the floor with a pillow under your head. 2. Use your neck muscles to push your head down on the pillow and straighten your spine. 3. Hold the position as well as you can. Keep your head facing up and your chin tucked. 4. Slowly count to 5 while holding this position. 5. Relax for a few seconds. Then repeat.  Isometric Strengthening Do a full set of these exercises 2 times a day or as told by your health care provider. 1. Sit in a supportive chair and place your hand on your forehead. 2. Push forward with your head and neck while pushing back with your hand. Hold for 10 seconds. 3. Relax. Then repeat the exercise 3 times. 4. Next, do thesequence again, this time putting your hand against the back of your head. Use your head and neck to push backward against the hand pressure. 5. Finally, do the same exercise on either side of your head, pushing sideways against the pressure of your hand.  Prone Head Lifts Repeat this exercise 5 times. Do this 2 times a day or as told by your health care provider. 1. Lie face-down, resting on your elbows so that your chest and upper back are raised. 2. Start with your head facing downward, near your chest. Position your chin either on or near your chest. 3. Slowly lift your head upward. Lift until you are looking straight  ahead. Then continue lifting your head as far back as you can stretch. 4. Hold your head up for 5 seconds. Then slowly lower it to your starting position.  Supine Head Lifts Repeat this exercise 8-10 times. Do this 2 times a day or as told by your health care provider. 1. Lie on your back, bending your knees to point to the ceiling and keeping your feet flat on the floor. 2. Lift your head slowly off the floor, raising your chin toward your chest. 3. Hold for 5 seconds. 4. Relax and repeat.  Scapular Retraction Repeat this exercise 5 times. Do this 2 times a day or as told by your health care provider. 1. Stand with your arms at your sides. Look straight ahead. 2. Slowly pull both shoulders backward and downward until you feel a stretch between your shoulder blades in your upper back. 3. Hold for 10-30 seconds. 4. Relax and repeat.  Contact a health care provider if:  Your neck pain or discomfort gets much worse when you do an exercise.  Your neck pain or discomfort does not improve within 2 hours after you exercise. If you have any of these problems, stop exercising right away. Do not do the exercises again unless your health care provider says that you can. Get help right away if:  You develop sudden, severe neck pain. If this happens, stop exercising right  away. Do not do the exercises again unless your health care provider says that you can. Exercises Neck Stretch  Repeat this exercise 3-5 times. 1. Do this exercise while standing or while sitting in a chair. 2. Place your feet flat on the floor, shoulder-width apart. 3. Slowly turn your head to the right. Turn it all the way to the right so you can look over your right shoulder. Do not tilt or tip your head. 4. Hold this position for 10-30 seconds. 5. Slowly turn your head to the left, to look over your left shoulder. 6. Hold this position for 10-30 seconds.  Neck Retraction Repeat this exercise 8-10 times. Do this 3-4  times a day or as told by your health care provider. 1. Do this exercise while standing or while sitting in a sturdy chair. 2. Look straight ahead. Do not bend your neck. 3. Use your fingers to push your chin backward. Do not bend your neck for this movement. Continue to face straight ahead. If you are doing the exercise properly, you will feel a slight sensation in your throat and a stretch at the back of your neck. 4. Hold the stretch for 1-2 seconds. Relax and repeat.  This information is not intended to replace advice given to you by your health care provider. Make sure you discuss any questions you have with your health care provider. Document Released: 10/31/2015 Document Revised: 04/26/2016 Document Reviewed: 05/30/2015 Elsevier Interactive Patient Education  2017 [place].  Back Exercises If you have pain in your back, do these exercises 2-3 times each day or as told by your doctor. When the pain goes away, do the exercises once each day, but repeat the steps more times for each exercise (do more repetitions). If you do not have pain in your back, do these exercises once each day or as told by your doctor. Exercises Single Knee to Chest  Do these steps 3-5 times in a row for each leg: 1. Lie on your back on a firm bed or the floor with your legs stretched out. 2. Bring one knee to your chest. 3. Hold your knee to your chest by grabbing your knee or thigh. 4. Pull on your knee until you feel a gentle stretch in your lower back. 5. Keep doing the stretch for 10-30 seconds. 6. Slowly let go of your leg and straighten it.  Pelvic Tilt  Do these steps 5-10 times in a row: 1. Lie on your back on a firm bed or the floor with your legs stretched out. 2. Bend your knees so they point up to the ceiling. Your feet should be flat on the floor. 3. Tighten your lower belly (abdomen) muscles to press your lower back against the floor. This will make your tailbone point up to the ceiling  instead of pointing down to your feet or the floor. 4. Stay in this position for 5-10 seconds while you gently tighten your muscles and breathe evenly.  Cat-Cow  Do these steps until your lower back bends more easily: 1. Get on your hands and knees on a firm surface. Keep your hands under your shoulders, and keep your knees under your hips. You may put padding under your knees. 2. Let your head hang down, and make your tailbone point down to the floor so your lower back is round like the back of a cat. 3. Stay in this position for 5 seconds. 4. Slowly lift your head and make your tailbone point up  to the ceiling so your back hangs low (sags) like the back of a cow. 5. Stay in this position for 5 seconds.  Press-Ups  Do these steps 5-10 times in a row: 1. Lie on your belly (face-down) on the floor. 2. Place your hands near your head, about shoulder-width apart. 3. While you keep your back relaxed and keep your hips on the floor, slowly straighten your arms to raise the top half of your body and lift your shoulders. Do not use your back muscles. To make yourself more comfortable, you may change where you place your hands. 4. Stay in this position for 5 seconds. 5. Slowly return to lying flat on the floor.  Bridges  Do these steps 10 times in a row: 1. Lie on your back on a firm surface. 2. Bend your knees so they point up to the ceiling. Your feet should be flat on the floor. 3. Tighten your butt muscles and lift your butt off of the floor until your waist is almost as high as your knees. If you do not feel the muscles working in your butt and the back of your thighs, slide your feet 1-2 inches farther away from your butt. 4. Stay in this position for 3-5 seconds. 5. Slowly lower your butt to the floor, and let your butt muscles relax.  If this exercise is too easy, try doing it with your arms crossed over your chest. Belly Crunches  Do these steps 5-10 times in a row: 1. Lie on your  back on a firm bed or the floor with your legs stretched out. 2. Bend your knees so they point up to the ceiling. Your feet should be flat on the floor. 3. Cross your arms over your chest. 4. Tip your chin a little bit toward your chest but do not bend your neck. 5. Tighten your belly muscles and slowly raise your chest just enough to lift your shoulder blades a tiny bit off of the floor. 6. Slowly lower your chest and your head to the floor.  Back Lifts Do these steps 5-10 times in a row: 1. Lie on your belly (face-down) with your arms at your sides, and rest your forehead on the floor. 2. Tighten the muscles in your legs and your butt. 3. Slowly lift your chest off of the floor while you keep your hips on the floor. Keep the back of your head in line with the curve in your back. Look at the floor while you do this. 4. Stay in this position for 3-5 seconds. 5. Slowly lower your chest and your face to the floor.  Contact a doctor if:  Your back pain gets a lot worse when you do an exercise.  Your back pain does not lessen 2 hours after you exercise. If you have any of these problems, stop doing the exercises. Do not do them again unless your doctor says it is okay. Get help right away if:  You have sudden, very bad back pain. If this happens, stop doing the exercises. Do not do them again unless your doctor says it is okay. This information is not intended to replace advice given to you by your health care provider. Make sure you discuss any questions you have with your health care provider. Document Released: 12/22/2010 Document Revised: 04/26/2016 Document Reviewed: 01/13/2015 Elsevier Interactive Patient Education  [employer].

## 2017-09-30 NOTE — Progress Notes (Signed)
Subjective:  Patient ID: Theresa Gibson, female    DOB: 09-23-1956  Age: 61 y.o. MRN: [id number]  CC: Hospitalization Follow-up (hospital follow up---pain on right side neck--went to ER had x-ray and given med(:robixon) but cant take it due to side effect. ); Back Pain (back pain near kidney area--going for 3 days?); and Knee Pain (had knee surgery---both knee painful at time--going on for a while?)   Back Pain  This is a new problem. The current episode started in the past 7 days. The problem occurs intermittently. The problem is unchanged. The pain is present in the lumbar spine and gluteal (left side). The quality of the pain is described as aching. The pain does not radiate. The symptoms are aggravated by twisting and bending. Pertinent negatives include no abdominal pain, bladder incontinence, bowel incontinence, dysuria, fever, leg pain, paresis, paresthesias, perianal numbness, weakness or weight loss. Risk factors include poor posture, obesity and lack of exercise. She has tried muscle relaxant and NSAIDs for the symptoms. The treatment provided mild relief.  Neck Pain   This is a new problem. The current episode started in the past 7 days. The pain is associated with a sleep position (repetitive motion). The pain is present in the left side. The quality of the pain is described as cramping. The symptoms are aggravated by twisting and bending. Pertinent negatives include no fever, leg pain, paresis, weakness or weight loss.   Ongoing for several months. Waxing and waning. Unable to use robaxin due to side effects (nausea) Flexeril caused increased somnolence in past. Some relief with ibuprofen. But has limited use due to current use of brillinta and ASA.  Outpatient Medications Prior to Visit  Medication Sig Dispense Refill  . aspirin EC 81 MG tablet Take 81 mg by mouth daily.    Marland Kitchen atorvastatin (LIPITOR) 40 MG tablet Take 40 mg by mouth daily at 6 PM.     . buPROPion (WELLBUTRIN SR) 150  MG 12 hr tablet Take 1 tablet (150 mg total) by mouth 2 (two) times daily. 180 tablet 0  . carvedilol (COREG) 3.125 MG tablet Take 3.125 mg by mouth 2 (two) times daily with a meal.    . cephALEXin (KEFLEX) 500 MG capsule Take 1 capsule (500 mg total) by mouth 3 (three) times daily. 15 capsule 0  . cetirizine (ZYRTEC) 10 MG tablet Take 10 mg by mouth daily as needed for allergies.    . cholecalciferol (VITAMIN D) 1000 units tablet Take 1,000 Units by mouth daily.    . clonazePAM (KLONOPIN) 0.5 MG tablet Take 0.5-1 tablets (0.25-0.5 mg total) by mouth 2 (two) times daily as needed for anxiety. 30 tablet 0  . fluticasone (FLONASE) 50 MCG/ACT nasal spray Place 2 sprays into both nostrils daily. (Patient taking differently: Place 2 sprays into both nostrils daily as needed for allergies. ) 16 g 0  . hydrOXYzine (ATARAX/VISTARIL) 25 MG tablet Take 1 tablet (25 mg total) by mouth 3 (three) times daily as needed for itching. 60 tablet 0  . losartan (COZAAR) 50 MG tablet Take 50 mg by mouth daily.    . Naltrexone-Bupropion HCl ER (CONTRAVE) 8-90 MG TB12 Take 1 tablet by mouth daily x 1 week; then 1 tablet twice daily x 1 week; then 2 tablets in the morning and 1 in the evening x 1 week; then 2 tablets twice daily. 120 tablet 3  . nitroGLYCERIN (NITROSTAT) 0.4 MG SL tablet Place 0.4 mg under the tongue every 5 (five) minutes  as needed for chest pain.    . pantoprazole (PROTONIX) 40 MG tablet Take 1 tablet (40 mg total) by mouth daily at 6 (six) AM. 30 tablet 3  . ticagrelor (BRILINTA) 90 MG TABS tablet Take 90 mg by mouth 2 (two) times daily.    Marland Kitchen topiramate (TOPAMAX) 50 MG tablet TAKE 1 TAB BY MOUTH TWICE DAILY 180 tablet 0  . triamcinolone (NASACORT) 55 MCG/ACT AERO nasal inhaler Place 2 sprays into the nose daily. (Patient taking differently: Place 1-2 sprays into the nose daily as needed (NASAL CONGESTION). )    . acetaminophen (TYLENOL) 500 MG tablet Take 1,000 mg by mouth every 6 (six) hours as needed  for moderate pain.    . methocarbamol (ROBAXIN) 500 MG tablet Take 1 tablet (500 mg total) by mouth 4 (four) times daily. (Patient not taking: Reported on 09/30/2017) 20 tablet 0   Facility-Administered Medications Prior to Visit  Medication Dose Route Frequency Provider Last Rate Last Dose  . lactated ringers infusion    Continuous PRN G[place] T, CRNA        ROS See HPI  Objective:  BP 110/70   Pulse 64   Temp 97.7 F (36.5 C)   Ht 5\' 4"  (1.626 m)   Wt 246 lb (111.6 kg)   SpO2 98%   BMI 42.23 kg/m   BP Readings from Last 3 Encounters:  09/30/17 110/70  09/27/17 128/78  06/17/17 112/72    Wt Readings from Last 3 Encounters:  09/30/17 246 lb (111.6 kg)  09/27/17 240 lb (108.9 kg)  06/17/17 246 lb (111.6 kg)    Physical Exam  Constitutional: She is oriented to person, place, and time.  Neck: Normal range of motion. Neck supple.  Cardiovascular: Normal rate.   Pulmonary/Chest: Effort normal.  Abdominal: Soft. Bowel sounds are normal. She exhibits no distension. There is no tenderness.  Musculoskeletal: She exhibits tenderness. She exhibits no edema or deformity.       Right hip: Normal.       Left hip: She exhibits tenderness. She exhibits normal range of motion, normal strength and no swelling.       Lumbar back: She exhibits normal range of motion, no tenderness, no edema, no pain and normal pulse.       Left upper leg: Normal.  Left diffuse tenderness.  Lymphadenopathy:    She has no cervical adenopathy.  Neurological: She is alert and oriented to person, place, and time.  Vitals reviewed.   Lab Results  Component Value Date   WBC 7.9 03/29/2017   HGB 12.6 03/29/2017   HCT 38.3 03/29/2017   PLT 224 03/29/2017   GLUCOSE 132 (H) 03/29/2017   CHOL 179 09/07/2014   TRIG 52.0 09/07/2014   HDL 48.70 09/07/2014   LDLCALC 120 (H) 09/07/2014   ALT 18 12/11/2015   AST 16 12/11/2015   NA 141 03/29/2017   K 3.7 03/29/2017   CL 112 (H) 03/29/2017    CREATININE 0.83 03/29/2017   BUN 15 03/29/2017   CO2 21 (L) 03/29/2017   TSH 0.88 09/07/2014   INR 1.08 03/29/2017    Dg Cervical Spine Complete  Result Date: 09/27/2017 CLINICAL DATA:  Right neck pain for the past 3 months, worsening. No known injury. EXAM: CERVICAL SPINE - COMPLETE 4+ VIEW COMPARISON:  None. FINDINGS: Mild reversal of the normal cervical lordosis. Minimal uncinate spur formation on the right at the C4-5 and C5-6 levels with minimal foraminal stenosis on the right at the  C5-6 level and no significant foraminal stenosis on the right at the C4-5 level. Otherwise, normal appearing bones and soft tissues. IMPRESSION: 1. Mild reversal of the normal cervical lordosis. This can be seen with muscle spasm. 2. Minimal uncinate spur formation on the right at the C4-5 and C5-6 levels with minimal foraminal stenosis on the right at the C5-6 level. Electronically Signed   By: Claudie Revering M.D.   On: 09/27/2017 22:20    Assessment & Plan:   Lujuana was seen today for hospitalization follow-up, back pain and knee pain.  Diagnoses and all orders for this visit:  Sciatica of left side -     predniSONE (STERAPRED UNI-PAK 21 TAB) 10 MG (21) TBPK tablet; As directed on package -     tiZANidine (ZANAFLEX) 4 MG capsule; Take 1 capsule (4 mg total) by mouth 3 (three) times daily as needed for muscle spasms. -     Diclofenac Sodium (PENNSAID) 2 % SOLN; Place 1 inch onto the skin 2 (two) times daily as needed. -     acetaminophen (TYLENOL) 500 MG tablet; Take 2 tablets (1,000 mg total) by mouth every 8 (eight) hours as needed for moderate pain.  Posterolateral cervical muscle strain, subsequent encounter -     predniSONE (STERAPRED UNI-PAK 21 TAB) 10 MG (21) TBPK tablet; As directed on package -     tiZANidine (ZANAFLEX) 4 MG capsule; Take 1 capsule (4 mg total) by mouth 3 (three) times daily as needed for muscle spasms. -     Diclofenac Sodium (PENNSAID) 2 % SOLN; Place 1 inch onto the skin 2  (two) times daily as needed. -     acetaminophen (TYLENOL) 500 MG tablet; Take 2 tablets (1,000 mg total) by mouth every 8 (eight) hours as needed for moderate pain.   I have discontinued Ms. Kingsford's methocarbamol. I have also changed her acetaminophen. Additionally, I am having her start on predniSONE, tiZANidine, and Diclofenac Sodium. Lastly, I am having her maintain her aspirin EC, atorvastatin, carvedilol, nitroGLYCERIN, ticagrelor, clonazePAM, triamcinolone, losartan, cetirizine, cholecalciferol, pantoprazole, fluticasone, cephALEXin, Naltrexone-Bupropion HCl ER, hydrOXYzine, buPROPion, and topiramate.  Meds ordered this encounter  Medications  . predniSONE (STERAPRED UNI-PAK 21 TAB) 10 MG (21) TBPK tablet    Sig: As directed on package    Dispense:  21 tablet    Refill:  0    Order Specific Question:   Supervising Provider    Answer:   Cassandria Anger [[id number]]  . tiZANidine (ZANAFLEX) 4 MG capsule    Sig: Take 1 capsule (4 mg total) by mouth 3 (three) times daily as needed for muscle spasms.    Dispense:  21 capsule    Refill:  0    Order Specific Question:   Supervising Provider    Answer:   Cassandria Anger [[id number]]  . Diclofenac Sodium (PENNSAID) 2 % SOLN    Sig: Place 1 inch onto the skin 2 (two) times daily as needed.    Dispense:  2 g    Refill:  0    Order Specific Question:   Supervising Provider    Answer:   Cassandria Anger [[id number]]  . acetaminophen (TYLENOL) 500 MG tablet    Sig: Take 2 tablets (1,000 mg total) by mouth every 8 (eight) hours as needed for moderate pain.    Dispense:  30 tablet    Refill:  0    Order Specific Question:   Supervising Provider    Answer:   Lew Dawes  V [[id number]]    Follow-up: No Follow-up on file.  Wilfred Lacy, NP

## 2017-11-18 ENCOUNTER — Other Ambulatory Visit: Payer: Self-pay | Admitting: Family

## 2017-12-20 ENCOUNTER — Emergency Department (HOSPITAL_BASED_OUTPATIENT_CLINIC_OR_DEPARTMENT_OTHER)
Admission: EM | Admit: 2017-12-20 | Discharge: 2017-12-20 | Disposition: A | Discharge status: Home / Self Care | Payer: BLUE CROSS/BLUE SHIELD | Attending: Emergency Medicine | Admitting: Emergency Medicine

## 2017-12-20 ENCOUNTER — Other Ambulatory Visit: Payer: Self-pay

## 2017-12-20 ENCOUNTER — Encounter (HOSPITAL_BASED_OUTPATIENT_CLINIC_OR_DEPARTMENT_OTHER): Payer: Self-pay

## 2017-12-20 DIAGNOSIS — B349 Viral infection, unspecified: Secondary | ICD-10-CM | POA: Insufficient documentation

## 2017-12-20 DIAGNOSIS — F17210 Nicotine dependence, cigarettes, uncomplicated: Secondary | ICD-10-CM | POA: Diagnosis not present

## 2017-12-20 DIAGNOSIS — I10 Essential (primary) hypertension: Secondary | ICD-10-CM | POA: Diagnosis not present

## 2017-12-20 DIAGNOSIS — J029 Acute pharyngitis, unspecified: Secondary | ICD-10-CM | POA: Diagnosis not present

## 2017-12-20 DIAGNOSIS — R070 Pain in throat: Secondary | ICD-10-CM | POA: Diagnosis not present

## 2017-12-20 DIAGNOSIS — Z79899 Other long term (current) drug therapy: Secondary | ICD-10-CM | POA: Insufficient documentation

## 2017-12-20 DIAGNOSIS — R0981 Nasal congestion: Secondary | ICD-10-CM | POA: Diagnosis not present

## 2017-12-20 DIAGNOSIS — Z7982 Long term (current) use of aspirin: Secondary | ICD-10-CM | POA: Insufficient documentation

## 2017-12-20 LAB — RAPID STREP SCREEN (MED CTR MEBANE ONLY): Streptococcus, Group A Screen (Direct): NEGATIVE

## 2017-12-20 MED ORDER — GI COCKTAIL ~~LOC~~
30.0000 mL | Freq: Once | ORAL | Status: AC
  Administered 2017-12-20: 30 mL via ORAL
  Filled 2017-12-20: qty 30

## 2017-12-20 MED ORDER — ACETAMINOPHEN 500 MG PO TABS
1000.0000 mg | ORAL_TABLET | Freq: Once | ORAL | Status: AC
  Administered 2017-12-20: 1000 mg via ORAL
  Filled 2017-12-20: qty 2

## 2017-12-20 MED ORDER — IBUPROFEN 800 MG PO TABS: 800.0000 mg | ORAL_TABLET | Freq: Three times a day (TID) | ORAL | 0 refills | Status: DC

## 2017-12-20 MED ORDER — FLUTICASONE PROPIONATE 50 MCG/ACT NA SUSP: 2.0000 | Freq: Every day | NASAL | 0 refills | Status: DC

## 2017-12-20 NOTE — ED Triage Notes (Signed)
Pt presents with sore throat, HA, body aches,and congestion. Onset last night. Pt did not take meds PTA.

## 2017-12-20 NOTE — ED Notes (Signed)
ED Provider at bedside. 

## 2017-12-20 NOTE — ED Provider Notes (Addendum)
[facility] EMERGENCY DEPARTMENT Provider Note   CSN: [id number] Arrival date & time: 12/20/17  3810     History   Chief Complaint Chief Complaint  Patient presents with  . Sore Throat    HPI Theresa Gibson is a 62 y.o. female.  The history is provided by the patient.  URI   This is a new problem. The current episode started yesterday. The problem has not changed since onset.There has been no fever. Associated symptoms include congestion, plugged ear sensation and sore throat. Pertinent negatives include no chest pain, no abdominal pain, no diarrhea, no nausea, no vomiting, no sinus pain, no swollen glands, no neck pain and no cough.    Past Medical History:  Diagnosis Date  . Acute MI, inferior wall ([place]) 08/2016   s/p DES x 2 to the RCA  . Allergy   . Arthritis   . Depression   . GERD (gastroesophageal reflux disease)   . Hypertension   . Meningioma ([place])   . PONV (postoperative nausea and vomiting)    nausea only  . Wears dentures    upper    Patient Active Problem List   Diagnosis Date Noted  . Itching 06/17/2017  . Chronic sinusitis 03/29/2017  . Chest pain, moderate coronary artery risk 02/28/2017  . Chest pain 02/28/2017  . Situational anxiety 11/19/2016  . Myocardial infarction ([place]) 11/19/2016  . Cloudy urine 07/02/2016  . Acute appendicitis 12/10/2015  . Vitamin D deficiency 09/13/2015  . Pain in joint, shoulder region 07/26/2015  . Generalized headaches 04/21/2015  . Osteoarthritis 04/21/2015  . Routine general medical examination at a health care facility 09/21/2014  . Decreased libido 09/21/2014  . Palpitations 09/07/2014  . SCIATICA, ACUTE 03/21/2009  . HEAD TRAUMA, CLOSED 03/05/2008  . CARPAL TUNNEL SYNDROME, BILATERAL 12/03/2007  . SYMPTOM, HYPERSOMNIA W/SLEEP APNEA NOS 06/24/2007  . CAVERNOUS HEMANGIOMA 04/18/2007  . MALAISE AND FATIGUE 03/27/2007  . Hyperlipidemia with target LDL less than 70 12/10/2006  . Obesity 12/10/2006   . DISORDER, TOBACCO USE 12/10/2006  . Depression 12/10/2006  . Essential hypertension 12/10/2006  . SINUSITIS, CHRONIC NOS 12/10/2006  . ALLERGIC RHINITIS 12/10/2006  . GERD 12/10/2006    Past Surgical History:  Procedure Laterality Date  . ABDOMINAL HYSTERECTOMY     Total   . BREAST SURGERY     BX right breast  . COLONOSCOPY W/ BIOPSIES AND POLYPECTOMY    . CORONARY ANGIOPLASTY WITH STENT PLACEMENT    . KNEE SURGERY Bilateral    arthroscopy  . LAPAROSCOPIC APPENDECTOMY N/A 12/10/2015   Procedure: APPENDECTOMY LAPAROSCOPIC;  Surgeon: Erroll Luna, MD;  Location: [facility];  Service: General;  Laterality: N/A;  . MULTIPLE TOOTH EXTRACTIONS    . NASAL/SINUS ENDOSCOPY Bilateral 03/29/2017   ENDOSCOPIC SINUS SURGERY WITH NAVIGATION (Bilateral)  . SINUS ENDO W/FUSION Bilateral 03/29/2017   Procedure: ENDOSCOPIC SINUS SURGERY WITH NAVIGATION;  Surgeon: Melissa Montane, MD;  Location: [facility];  Service: ENT;  Laterality: Bilateral;  . TUBAL LIGATION    . TURBINATE REDUCTION Bilateral 03/29/2017   Procedure: TURBINATE REDUCTION;  Surgeon: Melissa Montane, MD;  Location: [facility] OR;  Service: ENT;  Laterality: Bilateral;    OB History    No data available       Home Medications    Prior to Admission medications   Medication Sig Start Date End Date Taking? Authorizing Provider  topiramate (TOPAMAX) 50 MG tablet TAKE 1 TAB BY MOUTH TWICE DAILY 08/22/17  Yes Golden Circle, FNP  acetaminophen (TYLENOL) 500 MG tablet Take 2 tablets (1,000 mg total) by mouth every 8 (eight) hours as needed for moderate pain. 09/30/17   Nche, Charlene Brooke, NP  aspirin EC 81 MG tablet Take 81 mg by mouth daily.    [provider]  atorvastatin (LIPITOR) 40 MG tablet Take 40 mg by mouth daily at 6 PM.     [provider]  buPROPion (WELLBUTRIN SR) 150 MG 12 hr tablet Take 1 tablet (150 mg total) by mouth 2 (two) times daily. 08/22/17   Golden Circle, FNP  carvedilol (COREG) 3.125 MG tablet Take 3.125  mg by mouth 2 (two) times daily with a meal.    [provider]  cephALEXin (KEFLEX) 500 MG capsule Take 1 capsule (500 mg total) by mouth 3 (three) times daily. 03/29/17   Melissa Montane, MD  cetirizine (ZYRTEC) 10 MG tablet Take 10 mg by mouth daily as needed for allergies.    [provider]  cholecalciferol (VITAMIN D) 1000 units tablet Take 1,000 Units by mouth daily.    [provider]  clonazePAM (KLONOPIN) 0.5 MG tablet Take 0.5-1 tablets (0.25-0.5 mg total) by mouth 2 (two) times daily as needed for anxiety. 11/19/16   Golden Circle, FNP  Diclofenac Sodium (PENNSAID) 2 % SOLN Place 1 inch onto the skin 2 (two) times daily as needed. 09/30/17   Nche, Charlene Brooke, NP  fluticasone (FLONASE) 50 MCG/ACT nasal spray Place 2 sprays into both nostrils daily. Patient taking differently: Place 2 sprays into both nostrils daily as needed for allergies.  03/16/17   Vershawn Westrup, MD  hydrOXYzine (ATARAX/VISTARIL) 25 MG tablet Take 1 tablet (25 mg total) by mouth 3 (three) times daily as needed for itching. 06/17/17   Golden Circle, FNP  losartan (COZAAR) 50 MG tablet Take 50 mg by mouth daily.    [provider]  Naltrexone-Bupropion HCl ER (CONTRAVE) 8-90 MG TB12 Take 1 tablet by mouth daily x 1 week; then 1 tablet twice daily x 1 week; then 2 tablets in the morning and 1 in the evening x 1 week; then 2 tablets twice daily. 06/17/17   Golden Circle, FNP  nitroGLYCERIN (NITROSTAT) 0.4 MG SL tablet Place 0.4 mg under the tongue every 5 (five) minutes as needed for chest pain.    [provider]  pantoprazole (PROTONIX) 40 MG tablet Take 1 tablet (40 mg total) by mouth daily at 6 (six) AM. 03/02/17   Bhagat, Bhavinkumar, PA  predniSONE (STERAPRED UNI-PAK 21 TAB) 10 MG (21) TBPK tablet As directed on package 09/30/17   Nche, Charlene Brooke, NP  ticagrelor (BRILINTA) 90 MG TABS tablet Take 90 mg by mouth 2 (two) times daily.    [provider]    tiZANidine (ZANAFLEX) 4 MG capsule Take 1 capsule (4 mg total) by mouth 3 (three) times daily as needed for muscle spasms. 09/30/17   Nche, Charlene Brooke, NP  triamcinolone (NASACORT) 55 MCG/ACT AERO nasal inhaler Place 2 sprays into the nose daily. Patient taking differently: Place 1-2 sprays into the nose daily as needed (NASAL CONGESTION).  01/04/17   Molpus, John, MD    Family History Family History  Problem Relation Age of Onset  . Hypertension Mother   . Arthritis Mother   . Heart attack Father        In his 79's  . Ovarian cancer Maternal Grandmother   . Colon cancer Neg Hx   . Colon polyps Neg Hx   .  Rectal cancer Neg Hx   . Stomach cancer Neg Hx     Social History Social History   Tobacco Use  . Smoking status: Current Every Day Smoker    Packs/day: 0.50    Years: 42.00    Pack years: 21.00    Types: Cigarettes  . Smokeless tobacco: Never Used  Substance Use Topics  . Alcohol use: Yes    Alcohol/week: 0.0 oz    Comment: occ  . Drug use: No     Allergies   Nicotine and Ace inhibitors   Review of Systems Review of Systems  Constitutional: Negative for fever.  HENT: Positive for congestion and sore throat. Negative for drooling, facial swelling, hearing loss and sinus pain.   Eyes: Negative for photophobia.  Respiratory: Negative for cough.   Cardiovascular: Negative for chest pain.  Gastrointestinal: Negative for abdominal pain, diarrhea, nausea and vomiting.  Musculoskeletal: Negative for neck pain.  All other systems reviewed and are negative.    Physical Exam Updated Vital Signs BP (!) 148/98 (BP Location: Left Arm)   Pulse 76   Temp 97.9 F (36.6 C) (Oral)   Resp 18   Ht 5\' 4"  (1.626 m)   Wt 110.1 kg (242 lb 11.6 oz)   SpO2 100%   BMI 41.66 kg/m   Physical Exam  Constitutional: She is oriented to person, place, and time. She appears well-developed and well-nourished. No distress.  HENT:  Head: Normocephalic and atraumatic.  Right Ear:  External ear normal. No mastoid tenderness. Tympanic membrane is not injected, not scarred, not perforated and not erythematous.  Left Ear: External ear normal. No mastoid tenderness. Tympanic membrane is not injected, not scarred, not perforated and not erythematous.  Nose: Nose normal.  Mouth/Throat: Oropharynx is clear and moist. No oropharyngeal exudate.  Eyes: Conjunctivae and EOM are normal. Pupils are equal, round, and reactive to light.  No proptosis no facial tenderness  Neck: Normal range of motion. Neck supple. No JVD present.  Cardiovascular: Normal rate, regular rhythm, normal heart sounds and intact distal pulses.  Pulmonary/Chest: Effort normal and breath sounds normal. No stridor. She has no wheezes. She has no rales.  Abdominal: Soft. Bowel sounds are normal. She exhibits no mass. There is no tenderness. There is no rebound and no guarding.  Musculoskeletal: Normal range of motion.  Lymphadenopathy:    She has no cervical adenopathy.  Neurological: She is alert and oriented to person, place, and time. She displays normal reflexes.  Skin: Skin is warm and dry. Capillary refill takes less than 2 seconds.  Psychiatric: She has a normal mood and affect.     ED Treatments / Results  Labs (all labs ordered are listed, but only abnormal results are displayed)  Results for orders placed or performed during the hospital encounter of 12/20/17  Rapid strep screen  Result Value Ref Range   Streptococcus, Group A Screen (Direct) NEGATIVE NEGATIVE   No results found.  Radiology No results found.  Procedures Procedures (including critical care time)  Medications Ordered in ED Medications  acetaminophen (TYLENOL) tablet 1,000 mg (1,000 mg Oral Given 12/20/17 0654)  gi cocktail (Maalox,Lidocaine,Donnatal) (30 mLs Oral Given 12/20/17 0654)     Final Clinical Impressions(s) / ED Diagnoses   Return for worsening pain, vomiting blood inability to pass urine,  fevers > 100.4  unrelieved by medication, shortness of breath, intractable vomiting, or diarrhea, abdominal pain, Inability to tolerate liquids or food, cough, altered mental status or any concerns. No signs of  systemic illness or infection. The patient is nontoxic-appearing on exam and vital signs are within normal limits.    I have reviewed the triage vital signs and the nursing notes. Pertinent labs &imaging results that were available during my care of the patient were reviewed by me and considered in my medical decision making (see chart for details).  After history, exam, and medical workup I feel the patient has been appropriately medically screened and is safe for discharge home. Pertinent diagnoses were discussed with the patient. Patient was given return precautions.     Vanecia Limpert, MD 12/20/17 3539    Veatrice Kells, MD 12/20/17 1225

## 2017-12-22 DIAGNOSIS — J019 Acute sinusitis, unspecified: Secondary | ICD-10-CM | POA: Diagnosis not present

## 2017-12-22 LAB — CULTURE, GROUP A STREP (THRC)

## 2018-01-04 DIAGNOSIS — J029 Acute pharyngitis, unspecified: Secondary | ICD-10-CM | POA: Diagnosis not present

## 2018-01-22 NOTE — Progress Notes (Signed)
Subjective:    Patient ID: Theresa Gibson, female    DOB: 08/12/1956, 62 y.o.   MRN: [id number]  HPI The patient is here for an acute visit.  Blood sugar concerns:  She quit smoking two years ago after she had a heart attack and started eating more sweets.  She has a family history of diabetes.  Recently she has been itching.  She started checking her sugars this week and it was in the 160's.  Previously when she checked it - it was less than 100, but this was a while ago.  She has gained weight and has not been able to lose weight.  She has been experiencing headaches.  She is tired all the time.  She is experiencing increased urination and thirst.  She is concerned about having diabetes.  CAD, s/p MI 2 years ago: She last saw cardiology April 2018 and knows she needs to call and make a follow-up appointment.  She denies any true chest pain, but has a sensation in her chest at times when she has difficulty explaining.  She is short of breath with exertion, but feels that is weight related.  She denies palpitations or leg edema.  Hypertension: She is taking her medication daily. She is not compliant with a low sodium diet.  She denies chest pain, palpitations, edema and lightheadedness.  She is not currently exercising regularly.  She does not monitor her blood pressure at home.    She was exercising but not since having the flu.  [facility]  He is having difficulty getting back into regular exercise.  Headaches, fatigue, feeling foggy: She does have a history of sleep apnea, but when she was diagnosed she was told she did not have to be on any treatment.  She has gained weight since then.  She was unsure if her above symptoms were related to her sugar being elevated.  She does state that all she wants to do is sleep.  Medications and allergies reviewed with patient and updated if appropriate.  Patient Active Problem List   Diagnosis Date Noted  . Itching  06/17/2017  . Chronic sinusitis 03/29/2017  . Situational anxiety 11/19/2016  . Myocardial infarction ([place]) 11/19/2016  . Vitamin D deficiency 09/13/2015  . Pain in joint, shoulder region 07/26/2015  . Generalized headaches 04/21/2015  . Osteoarthritis 04/21/2015  . Routine general medical examination at a health care facility 09/21/2014  . Decreased libido 09/21/2014  . Palpitations 09/07/2014  . SCIATICA, ACUTE 03/21/2009  . HEAD TRAUMA, CLOSED 03/05/2008  . CARPAL TUNNEL SYNDROME, BILATERAL 12/03/2007  . SYMPTOM, HYPERSOMNIA W/SLEEP APNEA NOS 06/24/2007  . CAVERNOUS HEMANGIOMA 04/18/2007  . MALAISE AND FATIGUE 03/27/2007  . Hyperlipidemia with target LDL less than 70 12/10/2006  . Obesity 12/10/2006  . DISORDER, TOBACCO USE 12/10/2006  . Depression 12/10/2006  . Essential hypertension 12/10/2006  . ALLERGIC RHINITIS 12/10/2006  . GERD 12/10/2006    Current Outpatient Medications on File Prior to Visit  Medication Sig Dispense Refill  . acetaminophen (TYLENOL) 500 MG tablet Take 2 tablets (1,000 mg total) by mouth every 8 (eight) hours as needed for moderate pain. 30 tablet 0  . aspirin EC 81 MG tablet Take 81 mg by mouth daily.    Marland Kitchen atorvastatin (LIPITOR) 40 MG tablet Take 40 mg by mouth daily at 6 PM.     . buPROPion (WELLBUTRIN SR) 150 MG 12 hr tablet Take 1 tablet (150 mg total)  by mouth 2 (two) times daily. 180 tablet 0  . carvedilol (COREG) 3.125 MG tablet Take 3.125 mg by mouth 2 (two) times daily with a meal.    . cetirizine (ZYRTEC) 10 MG tablet Take 10 mg by mouth daily as needed for allergies.    . cholecalciferol (VITAMIN D) 1000 units tablet Take 1,000 Units by mouth daily.    . clonazePAM (KLONOPIN) 0.5 MG tablet Take 0.5-1 tablets (0.25-0.5 mg total) by mouth 2 (two) times daily as needed for anxiety. 30 tablet 0  . Diclofenac Sodium (PENNSAID) 2 % SOLN Place 1 inch onto the skin 2 (two) times daily as needed. 2 g 0  . fluticasone (FLONASE) 50 MCG/ACT nasal spray  Place 2 sprays into both nostrils daily. 16 g 0  . hydrOXYzine (ATARAX/VISTARIL) 25 MG tablet Take 1 tablet (25 mg total) by mouth 3 (three) times daily as needed for itching. 60 tablet 0  . ibuprofen (ADVIL,MOTRIN) 800 MG tablet Take 1 tablet (800 mg total) by mouth 3 (three) times daily. 21 tablet 0  . losartan (COZAAR) 50 MG tablet Take 50 mg by mouth daily.    . Naltrexone-Bupropion HCl ER (CONTRAVE) 8-90 MG TB12 Take 1 tablet by mouth daily x 1 week; then 1 tablet twice daily x 1 week; then 2 tablets in the morning and 1 in the evening x 1 week; then 2 tablets twice daily. 120 tablet 3  . nitroGLYCERIN (NITROSTAT) 0.4 MG SL tablet Place 0.4 mg under the tongue every 5 (five) minutes as needed for chest pain.    . pantoprazole (PROTONIX) 40 MG tablet Take 1 tablet (40 mg total) by mouth daily at 6 (six) AM. 30 tablet 3  . ticagrelor (BRILINTA) 90 MG TABS tablet Take 90 mg by mouth 2 (two) times daily.    Marland Kitchen tiZANidine (ZANAFLEX) 4 MG capsule Take 1 capsule (4 mg total) by mouth 3 (three) times daily as needed for muscle spasms. 21 capsule 0  . topiramate (TOPAMAX) 50 MG tablet TAKE 1 TAB BY MOUTH TWICE DAILY 180 tablet 0  . triamcinolone (NASACORT) 55 MCG/ACT AERO nasal inhaler Place 2 sprays into the nose daily. (Patient taking differently: Place 1-2 sprays into the nose daily as needed (NASAL CONGESTION). )     No current facility-administered medications on file prior to visit.     Past Medical History:  Diagnosis Date  . Acute MI, inferior wall ([place]) 08/2016   s/p DES x 2 to the RCA  . Allergy   . Arthritis   . Depression   . GERD (gastroesophageal reflux disease)   . Hypertension   . Meningioma ([place])   . PONV (postoperative nausea and vomiting)    nausea only  . Wears dentures    upper    Past Surgical History:  Procedure Laterality Date  . ABDOMINAL HYSTERECTOMY     Total   . BREAST SURGERY     BX right breast  . COLONOSCOPY W/ BIOPSIES AND POLYPECTOMY    . CORONARY  ANGIOPLASTY WITH STENT PLACEMENT    . KNEE SURGERY Bilateral    arthroscopy  . LAPAROSCOPIC APPENDECTOMY N/A 12/10/2015   Procedure: APPENDECTOMY LAPAROSCOPIC;  Surgeon: Erroll Luna, MD;  Location: [facility];  Service: General;  Laterality: N/A;  . MULTIPLE TOOTH EXTRACTIONS    . NASAL/SINUS ENDOSCOPY Bilateral 03/29/2017   ENDOSCOPIC SINUS SURGERY WITH NAVIGATION (Bilateral)  . SINUS ENDO W/FUSION Bilateral 03/29/2017   Procedure: ENDOSCOPIC SINUS SURGERY WITH NAVIGATION;  Surgeon: Melissa Montane, MD;  Location: [facility];  Service: ENT;  Laterality: Bilateral;  . TUBAL LIGATION    . TURBINATE REDUCTION Bilateral 03/29/2017   Procedure: TURBINATE REDUCTION;  Surgeon: Melissa Montane, MD;  Location: [facility];  Service: ENT;  Laterality: Bilateral;    Social History   Socioeconomic History  . Marital status: Divorced    Spouse name: None  . Number of children: None  . Years of education: 47  . Highest education level: None  Social Needs  . Financial resource strain: None  . Food insecurity - worry: None  . Food insecurity - inability: None  . Transportation needs - medical: None  . Transportation needs - non-medical: None  Occupational History  . Occupation: Nurse    Employer: [employer]  Tobacco Use  . Smoking status: Current Every Day Smoker    Packs/day: 0.50    Years: 42.00    Pack years: 21.00    Types: Cigarettes  . Smokeless tobacco: Never Used  Substance and Sexual Activity  . Alcohol use: Yes    Alcohol/week: 0.0 oz    Comment: occ  . Drug use: No  . Sexual activity: None  Other Topics Concern  . None  Social History Narrative   Born and raised in [place], [place]. 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 2 children (daughter 15) (daughter 64)   Plays with grandchildren   Recently started exercising with personal trainer   Started eating better    Family History  Problem Relation Age of Onset  . Hypertension Mother   . Arthritis Mother   . Heart attack  Father        In his 57's  . Ovarian cancer Maternal Grandmother   . Colon cancer Neg Hx   . Colon polyps Neg Hx   . Rectal cancer Neg Hx   . Stomach cancer Neg Hx     Review of Systems  Constitutional: Positive for diaphoresis (with exertion more easily). Negative for chills and fever.  Respiratory: Positive for shortness of breath (with exertion). Negative for cough and wheezing.   Cardiovascular: Negative for chest pain, palpitations and leg swelling.  Gastrointestinal:       Hiatal hernia - GERD, 1-2 / week  Endocrine: Positive for polydipsia and polyuria.  Neurological: Positive for headaches. Negative for dizziness and light-headedness.       Objective:   Vitals:   01/23/18 0855  BP: 126/72  Pulse: 84  Resp: 16  Temp: 97.7 F (36.5 C)  SpO2: 97%   Wt Readings from Last 3 Encounters:  01/23/18 243 lb (110.2 kg)  12/20/17 242 lb 11.6 oz (110.1 kg)  09/30/17 246 lb (111.6 kg)   Body mass index is 41.71 kg/m.   Physical Exam    Constitutional: Appears well-developed and well-nourished. No distress.  HENT:  Head: Normocephalic and atraumatic.  Neck: Neck supple. No tracheal deviation present. No thyromegaly present.  No cervical lymphadenopathy Cardiovascular: Normal rate, regular rhythm and normal heart sounds.   No murmur heard. No carotid bruit .  No edema Pulmonary/Chest: Effort normal and breath sounds normal. No respiratory distress. No has no wheezes. No rales.  Skin: Skin is warm and dry. Not diaphoretic.  Psychiatric: Normal mood and affect. Behavior is normal.       Assessment & Plan:    See Problem List for Assessment and Plan of chronic medical problems.

## 2018-01-23 ENCOUNTER — Encounter: Payer: Self-pay | Admitting: Internal Medicine

## 2018-01-23 ENCOUNTER — Ambulatory Visit: Payer: BLUE CROSS/BLUE SHIELD | Admitting: Internal Medicine

## 2018-01-23 ENCOUNTER — Other Ambulatory Visit: Payer: Self-pay | Admitting: Emergency Medicine

## 2018-01-23 ENCOUNTER — Other Ambulatory Visit (INDEPENDENT_AMBULATORY_CARE_PROVIDER_SITE_OTHER): Payer: BLUE CROSS/BLUE SHIELD

## 2018-01-23 VITALS — BP 126/72 | HR 84 | Temp 97.7°F | Resp 16 | Wt 243.0 lb

## 2018-01-23 DIAGNOSIS — E559 Vitamin D deficiency, unspecified: Secondary | ICD-10-CM

## 2018-01-23 DIAGNOSIS — R3589 Other polyuria: Secondary | ICD-10-CM

## 2018-01-23 DIAGNOSIS — I2111 ST elevation (STEMI) myocardial infarction involving right coronary artery: Secondary | ICD-10-CM

## 2018-01-23 DIAGNOSIS — E785 Hyperlipidemia, unspecified: Secondary | ICD-10-CM | POA: Diagnosis not present

## 2018-01-23 DIAGNOSIS — G4710 Hypersomnia, unspecified: Secondary | ICD-10-CM | POA: Diagnosis not present

## 2018-01-23 DIAGNOSIS — G4730 Sleep apnea, unspecified: Secondary | ICD-10-CM

## 2018-01-23 DIAGNOSIS — R358 Other polyuria: Secondary | ICD-10-CM | POA: Diagnosis not present

## 2018-01-23 DIAGNOSIS — E119 Type 2 diabetes mellitus without complications: Secondary | ICD-10-CM | POA: Insufficient documentation

## 2018-01-23 DIAGNOSIS — I10 Essential (primary) hypertension: Secondary | ICD-10-CM

## 2018-01-23 LAB — CBC WITH DIFFERENTIAL/PLATELET
BASOS ABS: 0 10*3/uL (ref 0.0–0.1)
Basophils Relative: 0.5 % (ref 0.0–3.0)
Eosinophils Absolute: 0.3 10*3/uL (ref 0.0–0.7)
Eosinophils Relative: 2.6 % (ref 0.0–5.0)
HEMATOCRIT: 40.6 % (ref 36.0–46.0)
Hemoglobin: 13.6 g/dL (ref 12.0–15.0)
LYMPHS PCT: 32.4 % (ref 12.0–46.0)
Lymphs Abs: 3.3 10*3/uL (ref 0.7–4.0)
MCHC: 33.5 g/dL (ref 30.0–36.0)
MCV: 85.2 fl (ref 78.0–100.0)
MONOS PCT: 9.2 % (ref 3.0–12.0)
Monocytes Absolute: 0.9 10*3/uL (ref 0.1–1.0)
NEUTROS ABS: 5.6 10*3/uL (ref 1.4–7.7)
Neutrophils Relative %: 55.3 % (ref 43.0–77.0)
Platelets: 275 10*3/uL (ref 150.0–400.0)
RBC: 4.76 Mil/uL (ref 3.87–5.11)
RDW: 15.2 % (ref 11.5–15.5)
WBC: 10.2 10*3/uL (ref 4.0–10.5)

## 2018-01-23 LAB — COMPREHENSIVE METABOLIC PANEL
ALK PHOS: 98 U/L (ref 39–117)
ALT: 17 U/L (ref 0–35)
AST: 15 U/L (ref 0–37)
Albumin: 3.7 g/dL (ref 3.5–5.2)
BILIRUBIN TOTAL: 0.5 mg/dL (ref 0.2–1.2)
BUN: 17 mg/dL (ref 6–23)
CALCIUM: 9.5 mg/dL (ref 8.4–10.5)
CO2: 24 mEq/L (ref 19–32)
Chloride: 108 mEq/L (ref 96–112)
Creatinine, Ser: 0.82 mg/dL (ref 0.40–1.20)
GFR: 91.57 mL/min (ref >60.00)
Glucose, Bld: 157 mg/dL — ABNORMAL HIGH (ref 70–99)
Potassium: 3.9 mEq/L (ref 3.5–5.1)
Sodium: 140 mEq/L (ref 135–145)
TOTAL PROTEIN: 6.8 g/dL (ref 6.0–8.3)

## 2018-01-23 LAB — LIPID PANEL
CHOLESTEROL: 137 mg/dL (ref 0–200)
HDL: 55.2 mg/dL (ref >39.00)
LDL CALC: 66 mg/dL (ref 0–99)
NonHDL: 82.03
Total CHOL/HDL Ratio: 2
Triglycerides: 79 mg/dL (ref 0.0–149.0)
VLDL: 15.8 mg/dL (ref 0.0–40.0)

## 2018-01-23 LAB — VITAMIN D 25 HYDROXY (VIT D DEFICIENCY, FRACTURES): VITD: 30.62 ng/mL (ref 30.00–100.00)

## 2018-01-23 LAB — HEMOGLOBIN A1C: Hgb A1c MFr Bld: 8.6 % — ABNORMAL HIGH (ref 4.6–6.5)

## 2018-01-23 LAB — TSH: TSH: 1.51 u[IU]/mL (ref 0.35–4.50)

## 2018-01-23 MED ORDER — METFORMIN HCL 500 MG PO TABS: 500.0000 mg | ORAL_TABLET | Freq: Two times a day (BID) | ORAL | 1 refills | Status: DC

## 2018-01-23 MED ORDER — BUPROPION HCL ER (SR) 150 MG PO TB12: 150.0000 mg | ORAL_TABLET | Freq: Two times a day (BID) | ORAL | 0 refills | Status: DC

## 2018-01-23 NOTE — Assessment & Plan Note (Signed)
Check lipid panel, CMP, TSH Continue daily statin Regular exercise and healthy diet encouraged  

## 2018-01-23 NOTE — Assessment & Plan Note (Signed)
Following with cardiology-last saw them 03/2017 She is experiencing some concerning symptoms that may be related to weight gain, but given her history of worsening of heart disease needs to be ruled out-she will call and schedule a follow-up Continue statin and baby aspirin Stressed lifestyle changes including increasing exercise, weight loss and improving her diet

## 2018-01-23 NOTE — Assessment & Plan Note (Signed)
Was diagnosed with sleep apnea in the past, but did not require treatment She has gained weight and is experiencing fatigue, frequent headaches and feeling foggy Concern for worsening of her sleep apnea Should be retested-we will refer to pulmonary Will work on weight loss

## 2018-01-23 NOTE — Assessment & Plan Note (Signed)
Has had some elevated sugars at home when she has checked it Concern for diabetes Check CMP, A1c Discussed lifestyle changes at length-she knows she needs to cut down on her sugars and carbohydrates.  She needs to decrease her portions overall.  She needs to start exercising regularly and lose weight Will consider medication if needed

## 2018-01-23 NOTE — Patient Instructions (Addendum)
  Test(s) ordered today. Your results will be released to [facility] (or called to you) after review, usually within 72hours after test completion. If any changes need to be made, you will be notified at that same time.   Medications reviewed and updated.   No changes recommended at this time.  Your prescription(s) have been submitted to your pharmacy. Please take as directed and contact our office if you believe you are having problem(s) with the medication(s).  A referral was ordered for pulmonary  Please followup in 2-3 months

## 2018-01-23 NOTE — Assessment & Plan Note (Signed)
Taking 1000 units daily Check level

## 2018-01-23 NOTE — Assessment & Plan Note (Signed)
BP Readings from Last 3 Encounters:  01/23/18 126/72  12/20/17 (!) 148/98  09/30/17 110/70   BP well controlled Current regimen effective and well tolerated Continue current medications at current doses cmp

## 2018-01-27 ENCOUNTER — Telehealth: Payer: Self-pay | Admitting: Family

## 2018-01-27 DIAGNOSIS — E119 Type 2 diabetes mellitus without complications: Secondary | ICD-10-CM

## 2018-01-27 NOTE — Telephone Encounter (Signed)
Copied from [facility]. Topic: Quick Communication - See Telephone Encounter >> Jan 27, 2018  4:20 PM Ivar Drape wrote: CRM for notification. See Telephone encounter for:  01/27/18. Patient wants to monitor her blood sugar levels and the pharmacist told her if she has a prescription for the meter, she can get it at half price. So she would like a prescription.

## 2018-01-28 MED ORDER — BLOOD GLUCOSE MONITOR KIT: PACK | 0 refills | Status: DC

## 2018-01-28 NOTE — Telephone Encounter (Signed)
Rx for meter has been sent to pharmacy.

## 2018-02-18 ENCOUNTER — Institutional Professional Consult (permissible substitution): Payer: Self-pay | Admitting: Pulmonary Disease

## 2018-02-24 ENCOUNTER — Ambulatory Visit: Payer: Self-pay | Admitting: Nurse Practitioner

## 2018-02-26 ENCOUNTER — Ambulatory Visit: Payer: BLUE CROSS/BLUE SHIELD | Admitting: Nurse Practitioner

## 2018-02-26 ENCOUNTER — Encounter: Payer: Self-pay | Admitting: Nurse Practitioner

## 2018-02-26 VITALS — BP 136/84 | HR 63 | Temp 98.2°F | Resp 16 | Ht 64.0 in | Wt 241.0 lb

## 2018-02-26 DIAGNOSIS — R5383 Other fatigue: Secondary | ICD-10-CM | POA: Diagnosis not present

## 2018-02-26 DIAGNOSIS — M25561 Pain in right knee: Secondary | ICD-10-CM

## 2018-02-26 DIAGNOSIS — E119 Type 2 diabetes mellitus without complications: Secondary | ICD-10-CM

## 2018-02-26 DIAGNOSIS — M25562 Pain in left knee: Secondary | ICD-10-CM

## 2018-02-26 DIAGNOSIS — G8929 Other chronic pain: Secondary | ICD-10-CM

## 2018-02-26 MED ORDER — METFORMIN HCL ER 500 MG PO TB24
500.0000 mg | ORAL_TABLET | Freq: Two times a day (BID) | ORAL | 2 refills | Status: DC
Start: 2018-02-26 — End: 2018-07-28

## 2018-02-26 MED ORDER — MELOXICAM 7.5 MG PO TABS
7.5000 mg | ORAL_TABLET | Freq: Every day | ORAL | 0 refills | Status: DC
Start: 2018-02-26 — End: 2018-06-26

## 2018-02-26 MED ORDER — BLOOD GLUCOSE MONITOR KIT: PACK | 0 refills | Status: DC

## 2018-02-26 NOTE — Patient Instructions (Signed)
I have placed a referral to nutrition education. Our office will call you to schedule this appointment. You should hear from our office in 7-10 days. I have switched your metformin to the extended release version 500 mg twice daily, as this version may be easier on your stomach. We are sending a meter prescription to your pharmacy.  Our goal is to keep your blood sugar consistently below <200.  Please keep your appointment with sports medicine for your knee pain and pulmonology for your sleep study. I sent a prescription for mobic 7.5 mg once daily for your knee pain- do not combine with other NSAIDS including ibuprofen, aleve, naproxen.  Please come back in about 1 month so that I can see how you are doing on the new metformin and how your fatigue is.  It was good to see you. Thanks for letting me take care of you today :)

## 2018-02-26 NOTE — Assessment & Plan Note (Signed)
I recommended that she remain on metformin for at least 3 months until we repeat her A1C, if it is drastically decreased could possibly consider a reduction in the metformin. We also discussed that metformin could help her body respond better to sugar intake and improve weight loss. We will try to switch her to metformin XR to see if she has less GI effects She is interested in diabetes education Glucometer ordered with instructions to check blood sugar at least a few times a week and keep log RTC in May for F/U, A1c but she will follow up sooner if she can not tolerate the metformin - Ambulatory referral to Nutrition and Diabetic Education - metFORMIN (GLUCOPHAGE XR) 500 MG 24 hr tablet; Take 1 tablet (500 mg total) by mouth 2 (two) times daily.  Dispense: 60 tablet; Refill: 2

## 2018-02-26 NOTE — Progress Notes (Signed)
Name: Theresa Gibson   MRN: [id number]    DOB: 02-21-1956   Date:02/26/2018       Progress Note  Subjective  Chief Complaint  Chief Complaint  Patient presents with  . Knee Pain    Bilateral knee pain, stays up on her feet all day at work, she states it is unbearable to stand for a long period of time, 05/23    HPI Theresa Gibson is establishing care with me today, transferring from another provider who has left our clinic. She is here to follow up on diabetes and would also like to discuss knee pain and fatigue.  Knee pain- This is a chronic problem  She reports a long history of knee pain, surgery to both knees, therapy, injections. Her pain has been mild for some time, but over about the past 2 months the pain has been severe on most days. She says she tries to rest and avoid activity which does not always help the pain. She has tried tylenol, OTC nsaids with no relief She does not recall any recent injuries. She denies falls, weakness, numbness, swelling, skin discoloration. 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  Fatigue- This is not a new problem She does not recall when she began to feel so tired but has noticed her fatigue has been worse this year She has been sleeping 9-10 hours a night and feels tired still She is scheduled for an upcoming sleep study ordered by another provider for a complaint of fatigue on OV in February. She does not routinely exercise and has not been watching her diet. 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 The diarrhea has improved but she has continued to experience some mild nausea. She says that she recently quit  smoking and was eating large amounts of sugary candy to avoid cigarettes and she thinks that this might have cause her elevated A1c, she is wondering if she even needs the metformin She does not have a glucometer but is interested in one so she can check her blood sugars at home Denies tremor, diaphoresis, polyuria, polydipsia, polyphagia.  Lab Results  Component Value Date   HGBA1C 8.6 (H) 01/23/2018     Patient Active Problem List   Diagnosis Date Noted  . Polyuria 01/23/2018  . Diabetes mellitus without complication ([facility] 038/45/3646 . Itching 06/17/2017  . Chronic sinusitis 03/29/2017  . Situational anxiety 11/19/2016  . Myocardial infarction ([facility] 11/19/2016  . Vitamin D deficiency 09/13/2015  . Pain in joint, shoulder region 07/26/2015  . Generalized headaches 04/21/2015  . Osteoarthritis 04/21/2015  . Decreased libido 09/21/2014  . CARPAL TUNNEL SYNDROME, BILATERAL 12/03/2007  . Hypersomnia with sleep apnea 06/24/2007  . CAVERNOUS HEMANGIOMA 04/18/2007  . MALAISE AND FATIGUE 03/27/2007  . Hyperlipidemia with target LDL less than 70 12/10/2006  . Obesity 12/10/2006  . Depression 12/10/2006  . Essential hypertension 12/10/2006  . ALLERGIC RHINITIS 12/10/2006  . GERD 12/10/2006    Past Surgical History:  Procedure Laterality Date  . ABDOMINAL HYSTERECTOMY     Total   . BREAST SURGERY     BX right breast  .  COLONOSCOPY W/ BIOPSIES AND POLYPECTOMY    . CORONARY ANGIOPLASTY WITH STENT PLACEMENT    . KNEE SURGERY Bilateral    arthroscopy  . LAPAROSCOPIC APPENDECTOMY N/A 12/10/2015   Procedure: APPENDECTOMY LAPAROSCOPIC;  Surgeon: Erroll Luna, MD;  Location: [facility];  Service: General;  Laterality: N/A;  . MULTIPLE TOOTH EXTRACTIONS    . NASAL/SINUS ENDOSCOPY Bilateral 03/29/2017   ENDOSCOPIC SINUS SURGERY WITH NAVIGATION (Bilateral)  . SINUS ENDO W/FUSION Bilateral 03/29/2017   Procedure: ENDOSCOPIC SINUS SURGERY WITH NAVIGATION;  Surgeon: Melissa Montane, MD;  Location:  [facility];  Service: ENT;  Laterality: Bilateral;  . TUBAL LIGATION    . TURBINATE REDUCTION Bilateral 03/29/2017   Procedure: TURBINATE REDUCTION;  Surgeon: Melissa Montane, MD;  Location: [facility] OR;  Service: ENT;  Laterality: Bilateral;    Family History  Problem Relation Age of Onset  . Hypertension Mother   . Arthritis Mother   . Heart attack Father        In his 16's  . Ovarian cancer Maternal Grandmother   . Colon cancer Neg Hx   . Colon polyps Neg Hx   . Rectal cancer Neg Hx   . Stomach cancer Neg Hx     Social History   Socioeconomic History  . Marital status: Divorced    Spouse name: Not on file  . Number of children: Not on file  . Years of education: 59  . Highest education level: Not on file  Occupational History  . Occupation: Optician, dispensing: [employer]  . Financial resource strain: Not on file  . Food insecurity:    Worry: Not on file    Inability: Not on file  . Transportation needs:    Medical: Not on file    Non-medical: Not on file  Tobacco Use  . Smoking status: Current Every Day Smoker    Packs/day: 0.50    Years: 42.00    Pack years: 21.00    Types: Cigarettes  . Smokeless tobacco: Never Used  Substance and Sexual Activity  . Alcohol use: Yes    Alcohol/week: 0.0 oz    Comment: occ  . Drug use: No  . Sexual activity: Not on file  Lifestyle  . Physical activity:    Days per week: Not on file    Minutes per session: Not on file  . Stress: Not on file  Relationships  . Social connections:    Talks on phone: Not on file    Gets together: Not on file    Attends religious service: Not on file    Active member of club or organization: Not on file    Attends meetings of clubs or organizations: Not on file    Relationship status: Not on file  . Intimate partner violence:    Fear of current or ex partner: Not on file    Emotionally abused: Not on file    Physically abused: Not on file    Forced sexual activity: Not on file  Other  Topics Concern  . Not on file  Social History Narrative   Born and raised in [place], [place]. 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 2 children (daughter 81) (daughter 13)   Plays with grandchildren   Recently started exercising with personal trainer   Started eating better     Current Outpatient Medications:  .  acetaminophen (TYLENOL) 500 MG tablet, Take 2 tablets (1,000 mg total) by mouth every 8 (eight) hours as needed  for moderate pain., Disp: 30 tablet, Rfl: 0 .  aspirin EC 81 MG tablet, Take 81 mg by mouth daily., Disp: , Rfl:  .  atorvastatin (LIPITOR) 40 MG tablet, Take 40 mg by mouth daily at 6 PM. , Disp: , Rfl:  .  blood glucose meter kit and supplies KIT, Dispense based on patient and insurance preference. Use up to four times daily as directed. DX CODE: E11.9, Disp: 1 each, Rfl: 0 .  buPROPion (WELLBUTRIN SR) 150 MG 12 hr tablet, Take 1 tablet (150 mg total) by mouth 2 (two) times daily., Disp: 180 tablet, Rfl: 0 .  carvedilol (COREG) 3.125 MG tablet, Take 3.125 mg by mouth 2 (two) times daily with a meal., Disp: , Rfl:  .  cetirizine (ZYRTEC) 10 MG tablet, Take 10 mg by mouth daily as needed for allergies., Disp: , Rfl:  .  cholecalciferol (VITAMIN D) 1000 units tablet, Take 1,000 Units by mouth daily., Disp: , Rfl:  .  Diclofenac Sodium (PENNSAID) 2 % SOLN, Place 1 inch onto the skin 2 (two) times daily as needed., Disp: 2 g, Rfl: 0 .  fluticasone (FLONASE) 50 MCG/ACT nasal spray, Place 2 sprays into both nostrils daily., Disp: 16 g, Rfl: 0 .  losartan (COZAAR) 50 MG tablet, Take 50 mg by mouth daily., Disp: , Rfl:  .  nitroGLYCERIN (NITROSTAT) 0.4 MG SL tablet, Place 0.4 mg under the tongue every 5 (five) minutes as needed for chest pain., Disp: , Rfl:  .  pantoprazole (PROTONIX) 40 MG tablet, Take 1 tablet (40 mg total) by mouth daily at 6 (six) AM., Disp: 30 tablet, Rfl: 3 .  ticagrelor (BRILINTA) 90 MG TABS tablet, Take 90 mg by mouth 2 (two) times  daily., Disp: , Rfl:  .  topiramate (TOPAMAX) 50 MG tablet, TAKE 1 TAB BY MOUTH TWICE DAILY, Disp: 180 tablet, Rfl: 0 .  triamcinolone (NASACORT) 55 MCG/ACT AERO nasal inhaler, Place 2 sprays into the nose daily. (Patient taking differently: Place 1-2 sprays into the nose daily as needed (NASAL CONGESTION). ), Disp: , Rfl:  .  blood glucose meter kit and supplies KIT, Dispense based on patient and insurance preference. Use up to four times daily as directed. Dx Code: E11.9, Disp: 1 each, Rfl: 0 .  meloxicam (MOBIC) 7.5 MG tablet, Take 1 tablet (7.5 mg total) by mouth daily., Disp: 14 tablet, Rfl: 0 .  metFORMIN (GLUCOPHAGE XR) 500 MG 24 hr tablet, Take 1 tablet (500 mg total) by mouth 2 (two) times daily., Disp: 60 tablet, Rfl: 2  Allergies  Allergen Reactions  . Nicotine     Patch...caused a rash  . Ace Inhibitors Cough    REACTION: Dry cough-lisinopril      ROS See HPI  Objective  Vitals:   02/26/18 0856  BP: 136/84  Pulse: 63  Resp: 16  Temp: 98.2 F (36.8 C)  TempSrc: Oral  SpO2: 98%  Weight: 241 lb (109.3 kg)  Height: '5\' 4"'  (1.626 m)    Body mass index is 41.37 kg/m.  Physical Exam Vital signs reviewed. Constitutional: Patient appears well-developed and well-nourished. No distress.  HENT: Head: Normocephalic and atraumatic. Nose: Nose normal. Mouth/Throat: Oropharynx is clear and moist. Eyes: Conjunctivae and EOM are normal. Pupils are equal, round, and reactive to light. No scleral icterus.  Neck: Normal range of motion. Neck supple. Cardiovascular: Normal rate, regular rhythm and normal heart sounds.  No murmur heard. No BLE edema. Distal pulses intact. Pulmonary/Chest: Effort normal and breath sounds  normal. No respiratory distress. Abdominal: Soft. NO distension. Musculoskeletal: No gross deformities Neurological: She is alert and oriented to person, place, and time.Coordination, balance, strength, speech and gait are normal.  Skin: Skin is warm and dry. No  rash noted. No erythema.  Psychiatric: Patient has a normal mood and affect. behavior is normal.   Assessment & Plan RTC around May for F/U of diabetes: switching metformin for metformin XR, repeat A1c; fatigue; depression   Chronic pain of both knees We were able to schedule an appointment with our sports medicine provider for her on April 9 for further evaluation and management I provided a prescription for mobic but she said she has tried this before with no relief and doubts she will take it- we did discuss dosing and side effects. Also discussed trial of scheduling tylenol daily rather than prn to prevent pain exacerbation, but not to exceed more than 3 g of tylenol a day. She says she actually prefers not to take medication if not needed will await her sports med eval for further treatment - meloxicam (MOBIC) 7.5 MG tablet; Take 1 tablet (7.5 mg total) by mouth daily.  Dispense: 14 tablet; Refill: 0  Other fatigue Recent Vitamin D, TSH, CMET, CBC, A1c on 2/21 reflected diabetes but otherwise normal. Sleep evaluation scheduled with [facility] pulmonology May 9 We discussed that fatigue could be related to numerous factors including lack of regular exercise and healthy diet, obesity, uncontrolled diabetes, sleep apnea, nighttime/shift work, depression. Referral to nutrition education today We will continue to work on glucose control Will re-assess her depression at upcoming [place] in May after pulmonology visit  for F/U

## 2018-03-04 ENCOUNTER — Emergency Department (HOSPITAL_BASED_OUTPATIENT_CLINIC_OR_DEPARTMENT_OTHER)
Admission: EM | Admit: 2018-03-04 | Discharge: 2018-03-04 | Disposition: A | Discharge status: Home / Self Care | Payer: BLUE CROSS/BLUE SHIELD | Attending: Emergency Medicine | Admitting: Emergency Medicine

## 2018-03-04 ENCOUNTER — Encounter (HOSPITAL_BASED_OUTPATIENT_CLINIC_OR_DEPARTMENT_OTHER): Payer: Self-pay | Admitting: *Deleted

## 2018-03-04 DIAGNOSIS — Z7984 Long term (current) use of oral hypoglycemic drugs: Secondary | ICD-10-CM | POA: Insufficient documentation

## 2018-03-04 DIAGNOSIS — M25562 Pain in left knee: Secondary | ICD-10-CM | POA: Insufficient documentation

## 2018-03-04 DIAGNOSIS — M25561 Pain in right knee: Secondary | ICD-10-CM

## 2018-03-04 DIAGNOSIS — I10 Essential (primary) hypertension: Secondary | ICD-10-CM | POA: Diagnosis not present

## 2018-03-04 DIAGNOSIS — F17210 Nicotine dependence, cigarettes, uncomplicated: Secondary | ICD-10-CM | POA: Insufficient documentation

## 2018-03-04 DIAGNOSIS — E119 Type 2 diabetes mellitus without complications: Secondary | ICD-10-CM | POA: Diagnosis not present

## 2018-03-04 DIAGNOSIS — Z7982 Long term (current) use of aspirin: Secondary | ICD-10-CM | POA: Diagnosis not present

## 2018-03-04 DIAGNOSIS — Z79899 Other long term (current) drug therapy: Secondary | ICD-10-CM | POA: Diagnosis not present

## 2018-03-04 MED ORDER — NAPROXEN 375 MG PO TABS: 375.0000 mg | ORAL_TABLET | Freq: Two times a day (BID) | ORAL | 0 refills | Status: DC

## 2018-03-04 MED FILL — NAPROXEN 375 MG TABLET: 375 | 15 days supply | Qty: 30 | Fill #0

## 2018-03-04 NOTE — ED Provider Notes (Signed)
[facility] EMERGENCY DEPARTMENT Provider Note   CSN: [id number] Arrival date & time: 03/04/18  1453     History   Chief Complaint Chief Complaint  Patient presents with  . Knee Pain    HPI Theresa Gibson is a 62 y.o. female.  She is complaining of bilateral knee pain that is been going on for a few weeks.  She does not endorse any specific trauma and just states it is worse with ambulation.  They are throbbing in nature.  It causes her to feel unbalanced.  There is been no fever no nausea no vomiting no diarrhea no dysuria.  She is a prior history of getting arthroscopic surgery on both knees and they have clean some cartilage in the past.  She is been an appointment with an orthopedist but is not until May.  She infrequently tries any kind of anti-inflammatory because her doctor told her it was in good with her cardiac condition.  The history is provided by the patient.  Knee Pain   This is a recurrent problem. The current episode started more than 1 week ago. The problem occurs constantly. The problem has not changed since onset.The pain is present in the left knee and right knee. The quality of the pain is described as aching. The pain is moderate. Associated symptoms include stiffness. Pertinent negatives include no numbness, full range of motion, no tingling and no itching. The symptoms are aggravated by activity. She has tried OTC pain medications for the symptoms. The treatment provided mild relief. There has been no history of extremity trauma. Family history is significant for no rheumatoid arthritis and no gout.    Past Medical History:  Diagnosis Date  . Acute MI, inferior wall ([place]) 08/2016   s/p DES x 2 to the RCA  . Allergy   . Arthritis   . Depression   . GERD (gastroesophageal reflux disease)   . Hypertension   . Meningioma ([place])   . PONV (postoperative nausea and vomiting)    nausea only  . Wears dentures    upper    Patient Active Problem List   Diagnosis Date Noted  . Diabetes mellitus without complication ([place]) 97/35/3299  . Chronic sinusitis 03/29/2017  . Situational anxiety 11/19/2016  . Myocardial infarction ([place]) 11/19/2016  . Vitamin D deficiency 09/13/2015  . Generalized headaches 04/21/2015  . Osteoarthritis 04/21/2015  . Decreased libido 09/21/2014  . CARPAL TUNNEL SYNDROME, BILATERAL 12/03/2007  . Hypersomnia with sleep apnea 06/24/2007  . CAVERNOUS HEMANGIOMA 04/18/2007  . Hyperlipidemia with target LDL less than 70 12/10/2006  . Obesity 12/10/2006  . Depression 12/10/2006  . Essential hypertension 12/10/2006  . ALLERGIC RHINITIS 12/10/2006  . GERD 12/10/2006    Past Surgical History:  Procedure Laterality Date  . ABDOMINAL HYSTERECTOMY     Total   . BREAST SURGERY     BX right breast  . COLONOSCOPY W/ BIOPSIES AND POLYPECTOMY    . CORONARY ANGIOPLASTY WITH STENT PLACEMENT    . KNEE SURGERY Bilateral    arthroscopy  . LAPAROSCOPIC APPENDECTOMY N/A 12/10/2015   Procedure: APPENDECTOMY LAPAROSCOPIC;  Surgeon: Erroll Luna, MD;  Location: [facility];  Service: General;  Laterality: N/A;  . MULTIPLE TOOTH EXTRACTIONS    . NASAL/SINUS ENDOSCOPY Bilateral 03/29/2017   ENDOSCOPIC SINUS SURGERY WITH NAVIGATION (Bilateral)  . SINUS ENDO W/FUSION Bilateral 03/29/2017   Procedure: ENDOSCOPIC SINUS SURGERY WITH NAVIGATION;  Surgeon: Melissa Montane, MD;  Location: [facility];  Service: ENT;  Laterality: Bilateral;  .  TUBAL LIGATION    . TURBINATE REDUCTION Bilateral 03/29/2017   Procedure: TURBINATE REDUCTION;  Surgeon: Melissa Montane, MD;  Location: [facility];  Service: ENT;  Laterality: Bilateral;     OB History   None      Home Medications    Prior to Admission medications   Medication Sig Start Date End Date Taking? Authorizing Provider  acetaminophen (TYLENOL) 500 MG tablet Take 2 tablets (1,000 mg total) by mouth every 8 (eight) hours as needed for moderate pain. 09/30/17   Nche, Charlene Brooke, NP  aspirin EC 81 MG tablet  Take 81 mg by mouth daily.    [provider]  atorvastatin (LIPITOR) 40 MG tablet Take 40 mg by mouth daily at 6 PM.     [provider]  blood glucose meter kit and supplies KIT Dispense based on patient and insurance preference. Use up to four times daily as directed. DX CODE: E11.9 01/28/18   Lance Sell, NP  blood glucose meter kit and supplies KIT Dispense based on patient and insurance preference. Use up to four times daily as directed. Dx Code: E11.9 02/26/18   Lance Sell, NP  buPROPion (WELLBUTRIN SR) 150 MG 12 hr tablet Take 1 tablet (150 mg total) by mouth 2 (two) times daily. 01/23/18   Binnie Rail, MD  carvedilol (COREG) 3.125 MG tablet Take 3.125 mg by mouth 2 (two) times daily with a meal.    [provider]  cetirizine (ZYRTEC) 10 MG tablet Take 10 mg by mouth daily as needed for allergies.    [provider]  cholecalciferol (VITAMIN D) 1000 units tablet Take 1,000 Units by mouth daily.    [provider]  Diclofenac Sodium (PENNSAID) 2 % SOLN Place 1 inch onto the skin 2 (two) times daily as needed. 09/30/17   Nche, Charlene Brooke, NP  fluticasone (FLONASE) 50 MCG/ACT nasal spray Place 2 sprays into both nostrils daily. 12/20/17   Palumbo, April, MD  losartan (COZAAR) 50 MG tablet Take 50 mg by mouth daily.    [provider]  meloxicam (MOBIC) 7.5 MG tablet Take 1 tablet (7.5 mg total) by mouth daily. 02/26/18   Lance Sell, NP  metFORMIN (GLUCOPHAGE XR) 500 MG 24 hr tablet Take 1 tablet (500 mg total) by mouth 2 (two) times daily. 02/26/18   Lance Sell, NP  nitroGLYCERIN (NITROSTAT) 0.4 MG SL tablet Place 0.4 mg under the tongue every 5 (five) minutes as needed for chest pain.    [provider]  pantoprazole (PROTONIX) 40 MG tablet Take 1 tablet (40 mg total) by mouth daily at 6 (six) AM. 03/02/17   Bhagat, Bhavinkumar, PA  ticagrelor (BRILINTA) 90 MG TABS tablet Take 90 mg by mouth 2  (two) times daily.    [provider]  topiramate (TOPAMAX) 50 MG tablet TAKE 1 TAB BY MOUTH TWICE DAILY 08/22/17   Golden Circle, FNP  triamcinolone (NASACORT) 55 MCG/ACT AERO nasal inhaler Place 2 sprays into the nose daily. Patient taking differently: Place 1-2 sprays into the nose daily as needed (NASAL CONGESTION).  01/04/17   Molpus, John, MD    Family History Family History  Problem Relation Age of Onset  . Hypertension Mother   . Arthritis Mother   . Heart attack Father        In his 27's  . Ovarian cancer Maternal Grandmother   . Colon cancer Neg Hx   . Colon polyps Neg Hx   .  Rectal cancer Neg Hx   . Stomach cancer Neg Hx     Social History Social History   Tobacco Use  . Smoking status: Current Every Day Smoker    Packs/day: 0.50    Years: 42.00    Pack years: 21.00    Types: Cigarettes  . Smokeless tobacco: Never Used  Substance Use Topics  . Alcohol use: Yes    Alcohol/week: 0.0 oz    Comment: occ  . Drug use: No     Allergies   Nicotine and Ace inhibitors   Review of Systems Review of Systems  Constitutional: Negative for fever.  HENT: Negative for sore throat.   Respiratory: Negative for shortness of breath.   Cardiovascular: Negative for chest pain.  Gastrointestinal: Negative for abdominal pain.  Genitourinary: Negative for dysuria.  Musculoskeletal: Positive for stiffness.  Skin: Negative for itching and rash.  Neurological: Negative for tingling and numbness.     Physical Exam Updated Vital Signs BP (!) 157/79 (BP Location: Left Arm)   Pulse 60   Temp 98.4 F (36.9 C) (Oral)   Resp 18   Ht _0  (1.626 m)   Wt 109.3 kg (241 lb)   SpO2 95%   BMI 41.37 kg/m   Physical Exam  Constitutional: She appears well-developed and well-nourished.  HENT:  Head: Normocephalic and atraumatic.  Eyes: Conjunctivae are normal.  Neck: Neck supple.  Musculoskeletal:       Right knee: She exhibits no swelling, no effusion, no  deformity, normal patellar mobility and no MCL laxity. Tenderness (diffuse) found.       Left knee: She exhibits no swelling, no effusion, no deformity, no erythema, no LCL laxity and no MCL laxity. Tenderness (diffuse) found.       Right lower leg: Normal.       Left lower leg: Normal.  Bilateral knees full range of motion.  No ligamentous laxity.  No significant effusion or warmth.  Positive DP pulse and no calf swelling no cords or edema.  Full range of motion of the hips without any tenderness.  Neurological: She is alert. GCS eye subscore is 4. GCS verbal subscore is 5. GCS motor subscore is 6.  Skin: Skin is warm and dry.  Psychiatric: She has a normal mood and affect.     ED Treatments / Results  Labs (all labs ordered are listed, but only abnormal results are displayed) Labs Reviewed - No data to display  EKG None  Radiology No results found.  Procedures Procedures (including critical care time)  Medications Ordered in ED Medications - No data to display   Initial Impression / Assessment and Plan / ED Course  I have reviewed the triage vital signs and the nursing notes.  Pertinent labs & imaging results that were available during my care of the patient were reviewed by me and considered in my medical decision making (see chart for details).     Differential diagnosis includes osteoarthritis, gout, septic arthritis, radicular pain.  On exam she is got diffuse tenderness of the knee associated with some stiffness and difficulty with ambulation.  Is not associate with any back pain in any systemic symptoms.  There is no obvious signs of any infection or inflammation in the knee.  She had a prior history of needing knee injections and arthroscopic surgery.  I think this is most likely inflammatory and probably due to repetitive use and the fact that she had gained some weight.  We discussed these and recommended  that we get her on some anti-inflammatories but that she would  need to call the orthopedist and see if they can get in sooner for evaluation.  I do not think that any imaging would be necessary as there was no specific trauma.  Final Clinical Impressions(s) / ED Diagnoses   Final diagnoses:  Acute pain of both knees    ED Discharge Orders        Ordered    naproxen (NAPROSYN) 375 MG tablet  2 times daily     03/04/18 1520       Hayden Rasmussen, MD 03/05/18 1115

## 2018-03-04 NOTE — ED Triage Notes (Signed)
Pt is here for worsening knee pain in both knees.  Pt reports hx of arthritis and chronic knee pain.  Pt is waiting for her appointment with a orthopedic MD in may.  Pt has been seen by PCP for the same.  No fall or trauma with this.

## 2018-03-04 NOTE — Discharge Instructions (Addendum)
Your evaluated in the emergency department for bilateral knee pain.  This is likely a flare of arthritis.  We are prescribing you some anti-inflammatory which she should take with food in your stomach.  Please contact your primary care doctor and let them know about your medication changes.  You can also try ice or heat to the area as needed for comfort.  Please contact the orthopedic clinic at [facility] orthopedist and see if they can move your appointment up.  Return if any problems.

## 2018-03-11 ENCOUNTER — Ambulatory Visit: Payer: Self-pay | Admitting: Family Medicine

## 2018-03-11 DIAGNOSIS — M25562 Pain in left knee: Secondary | ICD-10-CM | POA: Diagnosis not present

## 2018-03-11 DIAGNOSIS — M25561 Pain in right knee: Secondary | ICD-10-CM | POA: Diagnosis not present

## 2018-03-13 DIAGNOSIS — M25561 Pain in right knee: Secondary | ICD-10-CM | POA: Insufficient documentation

## 2018-03-19 ENCOUNTER — Ambulatory Visit: Payer: Self-pay | Admitting: Nurse Practitioner

## 2018-04-10 ENCOUNTER — Institutional Professional Consult (permissible substitution): Payer: Self-pay | Admitting: Pulmonary Disease

## 2018-04-16 ENCOUNTER — Other Ambulatory Visit: Payer: Self-pay | Admitting: Family

## 2018-04-22 ENCOUNTER — Ambulatory Visit: Payer: Self-pay | Admitting: Registered"

## 2018-05-23 ENCOUNTER — Institutional Professional Consult (permissible substitution): Payer: Self-pay | Admitting: Pulmonary Disease

## 2018-05-26 ENCOUNTER — Encounter: Payer: Self-pay | Admitting: Nurse Practitioner

## 2018-06-03 DIAGNOSIS — I10 Essential (primary) hypertension: Secondary | ICD-10-CM | POA: Diagnosis not present

## 2018-06-03 DIAGNOSIS — R072 Precordial pain: Secondary | ICD-10-CM | POA: Diagnosis not present

## 2018-06-03 DIAGNOSIS — Z955 Presence of coronary angioplasty implant and graft: Secondary | ICD-10-CM | POA: Insufficient documentation

## 2018-06-03 DIAGNOSIS — I2510 Atherosclerotic heart disease of native coronary artery without angina pectoris: Secondary | ICD-10-CM | POA: Diagnosis not present

## 2018-06-03 DIAGNOSIS — E782 Mixed hyperlipidemia: Secondary | ICD-10-CM | POA: Diagnosis not present

## 2018-06-19 DIAGNOSIS — M170 Bilateral primary osteoarthritis of knee: Secondary | ICD-10-CM | POA: Diagnosis not present

## 2018-06-26 ENCOUNTER — Emergency Department (HOSPITAL_COMMUNITY)
Admission: EM | Admit: 2018-06-26 | Discharge: 2018-06-26 | Disposition: A | Discharge status: Home / Self Care | Payer: BLUE CROSS/BLUE SHIELD | Attending: Emergency Medicine | Admitting: Emergency Medicine

## 2018-06-26 ENCOUNTER — Encounter (HOSPITAL_COMMUNITY): Payer: Self-pay | Admitting: *Deleted

## 2018-06-26 DIAGNOSIS — E119 Type 2 diabetes mellitus without complications: Secondary | ICD-10-CM | POA: Diagnosis not present

## 2018-06-26 DIAGNOSIS — S83241A Other tear of medial meniscus, current injury, right knee, initial encounter: Secondary | ICD-10-CM | POA: Diagnosis not present

## 2018-06-26 DIAGNOSIS — W19XXXA Unspecified fall, initial encounter: Secondary | ICD-10-CM | POA: Diagnosis not present

## 2018-06-26 DIAGNOSIS — F17210 Nicotine dependence, cigarettes, uncomplicated: Secondary | ICD-10-CM | POA: Diagnosis not present

## 2018-06-26 DIAGNOSIS — I10 Essential (primary) hypertension: Secondary | ICD-10-CM | POA: Insufficient documentation

## 2018-06-26 DIAGNOSIS — M25562 Pain in left knee: Secondary | ICD-10-CM | POA: Diagnosis not present

## 2018-06-26 DIAGNOSIS — Z7982 Long term (current) use of aspirin: Secondary | ICD-10-CM | POA: Insufficient documentation

## 2018-06-26 DIAGNOSIS — Y990 Civilian activity done for income or pay: Secondary | ICD-10-CM | POA: Diagnosis not present

## 2018-06-26 DIAGNOSIS — Y929 Unspecified place or not applicable: Secondary | ICD-10-CM | POA: Diagnosis not present

## 2018-06-26 DIAGNOSIS — M25561 Pain in right knee: Secondary | ICD-10-CM | POA: Diagnosis not present

## 2018-06-26 DIAGNOSIS — Y939 Activity, unspecified: Secondary | ICD-10-CM | POA: Insufficient documentation

## 2018-06-26 DIAGNOSIS — Z79899 Other long term (current) drug therapy: Secondary | ICD-10-CM | POA: Insufficient documentation

## 2018-06-26 MED ORDER — DICLOFENAC SODIUM 50 MG PO TBEC: 50.0000 mg | DELAYED_RELEASE_TABLET | Freq: Two times a day (BID) | ORAL | 0 refills | Status: DC

## 2018-06-26 MED ORDER — TRAMADOL HCL 50 MG PO TABS: 50.0000 mg | ORAL_TABLET | Freq: Four times a day (QID) | ORAL | 0 refills | Status: DC | PRN

## 2018-06-26 NOTE — ED Provider Notes (Signed)
[facility] EMERGENCY DEPARTMENT Provider Note   CSN: [id number] Arrival date & time: 06/26/18  1417     History   Chief Complaint Chief Complaint  Patient presents with  . Knee Pain    HPI Theresa Gibson is a 62 y.o. female who presents to the ED with knee pain. Patient reports that she fell at work and and has a torn meniscus. She has been to her Worker's Comp doctor and sent for MRI that confirmed dx of bilateral knees. Patient states that they were to get her an appointment with an orthopedic doctor for surgery but have not done so. Patient comes today with increased pain and has been unable to get in touch with Workers Comp people. They have not returned any calls. The injury was over a month ago.   HPI  Past Medical History:  Diagnosis Date  . Acute MI, inferior wall (HCC) 08/2016   s/p DES x 2 to the RCA  . Allergy   . Arthritis   . Depression   . GERD (gastroesophageal reflux disease)   . Hypertension   . Meningioma (HCC)   . PONV (postoperative nausea and vomiting)    nausea only  . Wears dentures    upper    Patient Active Problem List   Diagnosis Date Noted  . Diabetes mellitus without complication (HCC) 01/23/2018  . Chronic sinusitis 03/29/2017  . Situational anxiety 11/19/2016  . Myocardial infarction (HCC) 11/19/2016  . Vitamin D deficiency 09/13/2015  . Generalized headaches 04/21/2015  . Osteoarthritis 04/21/2015  . Decreased libido 09/21/2014  . CARPAL TUNNEL SYNDROME, BILATERAL 12/03/2007  . Hypersomnia with sleep apnea 06/24/2007  . CAVERNOUS HEMANGIOMA 04/18/2007  . Hyperlipidemia with target LDL less than 70 12/10/2006  . Obesity 12/10/2006  . Depression 12/10/2006  . Essential hypertension 12/10/2006  . ALLERGIC RHINITIS 12/10/2006  . GERD 12/10/2006    Past Surgical History:  Procedure Laterality Date  . ABDOMINAL HYSTERECTOMY     Total   . BREAST SURGERY     BX right breast  . COLONOSCOPY W/ BIOPSIES AND  POLYPECTOMY    . CORONARY ANGIOPLASTY WITH STENT PLACEMENT    . KNEE SURGERY Bilateral    arthroscopy  . LAPAROSCOPIC APPENDECTOMY N/A 12/10/2015   Procedure: APPENDECTOMY LAPAROSCOPIC;  Surgeon: Thomas Cornett, MD;  Location: [facility] OR;  Service: General;  Laterality: N/A;  . MULTIPLE TOOTH EXTRACTIONS    . NASAL/SINUS ENDOSCOPY Bilateral 03/29/2017   ENDOSCOPIC SINUS SURGERY WITH NAVIGATION (Bilateral)  . SINUS ENDO W/FUSION Bilateral 03/29/2017   Procedure: ENDOSCOPIC SINUS SURGERY WITH NAVIGATION;  Surgeon: John Byers, MD;  Location: [facility] OR;  Service: ENT;  Laterality: Bilateral;  . TUBAL LIGATION    . TURBINATE REDUCTION Bilateral 03/29/2017   Procedure: TURBINATE REDUCTION;  Surgeon: John Byers, MD;  Location: [facility] OR;  Service: ENT;  Laterality: Bilateral;     OB History   None      Home Medications    Prior to Admission medications   Medication Sig Start Date End Date Taking? Authorizing Provider  acetaminophen (TYLENOL) 500 MG tablet Take 2 tablets (1,000 mg total) by mouth every 8 (eight) hours as needed for moderate pain. 09/30/17   Nche, Charlotte Lum, NP  aspirin EC 81 MG tablet Take 81 mg by mouth daily.    [provider]  atorvastatin (LIPITOR) 40 MG tablet Take 40 mg by mouth daily at 6 PM.     [provider]  blood glucose meter kit   and supplies KIT Dispense based on patient and insurance preference. Use up to four times daily as directed. DX CODE: E11.9 01/28/18   Shambley, Ashleigh N, NP  blood glucose meter kit and supplies KIT Dispense based on patient and insurance preference. Use up to four times daily as directed. Dx Code: E11.9 02/26/18   Shambley, Ashleigh N, NP  buPROPion (WELLBUTRIN SR) 150 MG 12 hr tablet Take 1 tablet (150 mg total) by mouth 2 (two) times daily. 01/23/18   Burns, Stacy J, MD  carvedilol (COREG) 3.125 MG tablet Take 3.125 mg by mouth 2 (two) times daily with a meal.    [provider]  cetirizine (ZYRTEC) 10 MG tablet Take  10 mg by mouth daily as needed for allergies.    [provider]  cholecalciferol (VITAMIN D) 1000 units tablet Take 1,000 Units by mouth daily.    [provider]  diclofenac (VOLTAREN) 50 MG EC tablet Take 1 tablet (50 mg total) by mouth 2 (two) times daily. 06/26/18   Neese, Hope M, NP  Diclofenac Sodium (PENNSAID) 2 % SOLN Place 1 inch onto the skin 2 (two) times daily as needed. 09/30/17   Nche, Charlotte Lum, NP  fluticasone (FLONASE) 50 MCG/ACT nasal spray Place 2 sprays into both nostrils daily. 12/20/17   Palumbo, April, MD  losartan (COZAAR) 50 MG tablet Take 50 mg by mouth daily.    [provider]  metFORMIN (GLUCOPHAGE XR) 500 MG 24 hr tablet Take 1 tablet (500 mg total) by mouth 2 (two) times daily. 02/26/18   Shambley, Ashleigh N, NP  nitroGLYCERIN (NITROSTAT) 0.4 MG SL tablet Place 0.4 mg under the tongue every 5 (five) minutes as needed for chest pain.    [provider]  pantoprazole (PROTONIX) 40 MG tablet Take 1 tablet (40 mg total) by mouth daily at 6 (six) AM. 03/02/17   Bhagat, Bhavinkumar, PA  ticagrelor (BRILINTA) 90 MG TABS tablet Take 90 mg by mouth 2 (two) times daily.    [provider]  topiramate (TOPAMAX) 50 MG tablet TAKE 1 TAB BY MOUTH TWICE DAILY 08/22/17   Calone, Gregory D, FNP  traMADol (ULTRAM) 50 MG tablet Take 1 tablet (50 mg total) by mouth every 6 (six) hours as needed. 06/26/18   Neese, Hope M, NP  triamcinolone (NASACORT) 55 MCG/ACT AERO nasal inhaler Place 2 sprays into the nose daily. Patient taking differently: Place 1-2 sprays into the nose daily as needed (NASAL CONGESTION).  01/04/17   Molpus, John, MD    Family History Family History  Problem Relation Age of Onset  . Hypertension Mother   . Arthritis Mother   . Heart attack Father        In his 50's  . Ovarian cancer Maternal Grandmother   . Colon cancer Neg Hx   . Colon polyps Neg Hx   . Rectal cancer Neg Hx   . Stomach cancer Neg Hx     Social  History Social History   Tobacco Use  . Smoking status: Current Every Day Smoker    Packs/day: 0.50    Years: 42.00    Pack years: 21.00    Types: Cigarettes  . Smokeless tobacco: Never Used  Substance Use Topics  . Alcohol use: Yes    Alcohol/week: 0.0 oz    Comment: occ  . Drug use: No     Allergies   Nicotine and Ace inhibitors   Review of Systems Review of Systems  Musculoskeletal: Positive for arthralgias.         Bilateral knee pain  All other systems reviewed and are negative.    Physical Exam Updated Vital Signs BP 99/79 (BP Location: Right Arm)   Pulse 83   Temp 98 F (36.7 C) (Oral)   Resp 20   SpO2 100%   Physical Exam  Constitutional: She appears well-developed and well-nourished. No distress.  HENT:  Head: Normocephalic and atraumatic.  Eyes: EOM are normal.  Neck: Neck supple.  Cardiovascular: Normal rate.  Pulmonary/Chest: Effort normal.  Musculoskeletal:       Right knee: She exhibits decreased range of motion (due to pain) and swelling. Tenderness found. Medial joint line tenderness noted.  Neurological: She is alert.  Skin: Skin is warm and dry.  Psychiatric: She has a normal mood and affect. Her behavior is normal.  Nursing note and vitals reviewed.    ED Treatments / Results  Labs (all labs ordered are listed, but only abnormal results are displayed) Labs Reviewed - No data to display  Radiology No results found.  Procedures Procedures (including critical care time)  Medications Ordered in ED Medications - No data to display I discussed this case with Dr. Ellender Hose and since the patient has seen Dr. Wynelle Link in the past and can not get referral appointment from worker's comp to see anyone patient will call and schedule an appointment with Dr. Wynelle Link. We will treat patient for pain. Stable for d/c.   Initial Impression / Assessment and Plan / ED Course  I have reviewed the triage vital signs and the nursing notes.   Final  Clinical Impressions(s) / ED Diagnoses   Final diagnoses:  Acute meniscal tear, medial, right, initial encounter    ED Discharge Orders        Ordered    traMADol (ULTRAM) 50 MG tablet  Every 6 hours PRN     06/26/18 1452    diclofenac (VOLTAREN) 50 MG EC tablet  2 times daily     06/26/18 1452       Janit Bern [place], NP 06/26/18 1535    Duffy Bruce, MD 06/26/18 2248

## 2018-06-26 NOTE — Discharge Instructions (Signed)
Call Dr. Wynelle Link and schedule an appointment. Tell him you were seen in the ED.

## 2018-06-26 NOTE — ED Triage Notes (Signed)
Pt in c/o bilateral knee pain after a fall at work last month, has been evaluated and had MRI's completed and has been seen by orthopedic MD, states they recommended follow up but has been unable to get an appointment, in due to increased pain

## 2018-06-26 NOTE — ED Notes (Signed)
Declined W/C at D/C and was escorted to lobby by RN. 

## 2018-07-16 ENCOUNTER — Institutional Professional Consult (permissible substitution): Payer: Self-pay | Admitting: Pulmonary Disease

## 2018-07-18 ENCOUNTER — Ambulatory Visit: Payer: BLUE CROSS/BLUE SHIELD | Admitting: Nurse Practitioner

## 2018-07-28 ENCOUNTER — Encounter: Payer: Self-pay | Admitting: Nurse Practitioner

## 2018-07-28 ENCOUNTER — Ambulatory Visit: Payer: BLUE CROSS/BLUE SHIELD | Admitting: Nurse Practitioner

## 2018-07-28 ENCOUNTER — Other Ambulatory Visit (INDEPENDENT_AMBULATORY_CARE_PROVIDER_SITE_OTHER): Payer: BLUE CROSS/BLUE SHIELD

## 2018-07-28 VITALS — BP 120/70 | HR 62 | Ht 64.0 in | Wt 243.0 lb

## 2018-07-28 DIAGNOSIS — Z1239 Encounter for other screening for malignant neoplasm of breast: Secondary | ICD-10-CM

## 2018-07-28 DIAGNOSIS — M25561 Pain in right knee: Secondary | ICD-10-CM

## 2018-07-28 DIAGNOSIS — Z1231 Encounter for screening mammogram for malignant neoplasm of breast: Secondary | ICD-10-CM

## 2018-07-28 DIAGNOSIS — E119 Type 2 diabetes mellitus without complications: Secondary | ICD-10-CM | POA: Diagnosis not present

## 2018-07-28 DIAGNOSIS — I10 Essential (primary) hypertension: Secondary | ICD-10-CM | POA: Diagnosis not present

## 2018-07-28 DIAGNOSIS — G8929 Other chronic pain: Secondary | ICD-10-CM

## 2018-07-28 DIAGNOSIS — H6121 Impacted cerumen, right ear: Secondary | ICD-10-CM

## 2018-07-28 DIAGNOSIS — E785 Hyperlipidemia, unspecified: Secondary | ICD-10-CM

## 2018-07-28 DIAGNOSIS — M25562 Pain in left knee: Secondary | ICD-10-CM

## 2018-07-28 DIAGNOSIS — R2681 Unsteadiness on feet: Secondary | ICD-10-CM

## 2018-07-28 DIAGNOSIS — H9313 Tinnitus, bilateral: Secondary | ICD-10-CM

## 2018-07-28 DIAGNOSIS — F324 Major depressive disorder, single episode, in partial remission: Secondary | ICD-10-CM

## 2018-07-28 LAB — HEMOGLOBIN A1C: Hgb A1c MFr Bld: 7.7 % — ABNORMAL HIGH (ref 4.6–6.5)

## 2018-07-28 MED ORDER — LOSARTAN POTASSIUM 50 MG PO TABS: 50.0000 mg | ORAL_TABLET | Freq: Every day | ORAL | 3 refills | Status: DC

## 2018-07-28 MED ORDER — DICLOFENAC SODIUM 1 % TD GEL: 4.0000 g | Freq: Four times a day (QID) | TRANSDERMAL | 1 refills | Status: DC

## 2018-07-28 MED ORDER — BUPROPION HCL ER (XL) 150 MG PO TB24: 150.0000 mg | ORAL_TABLET | Freq: Every day | ORAL | 1 refills | Status: DC

## 2018-07-28 MED ORDER — METFORMIN HCL ER 500 MG PO TB24: 500.0000 mg | ORAL_TABLET | Freq: Two times a day (BID) | ORAL | 2 refills | Status: DC

## 2018-07-28 NOTE — Assessment & Plan Note (Signed)
Continue metformin at current dosage Update labs F/U with further recommendations pending lab results - metFORMIN (GLUCOPHAGE XR) 500 MG 24 hr tablet; Take 1 tablet (500 mg total) by mouth 2 (two) times daily.  Dispense: 60 tablet; Refill: 2 - Hemoglobin A1c; Future

## 2018-07-28 NOTE — Progress Notes (Signed)
Name: Theresa Gibson   MRN: [id number]    DOB: 11/16/1956   Date:07/28/2018       Progress Note  Subjective  Chief Complaint Follow up  HPI Theresa Gibson is here today for routine follow up and medication refills. She tells me since our last OV she has seen her cardiologist for routine F/U of atherosclerosis, HLD, HTN and old myocardial infarction, her brilinta was stopped but no other changes were made to her medications. She is also requesting a refill of voltaren for knee pain and prescription for cane during todays visit.  Diabetes- maintained on metformin 500 BID , which was switched to XR formula at her last OV on 3/27 due to GI side effects, I wanted her to return around may to repeat A1c but she didn't return until today. Reports she has switched the metformin as instructed with improvement in GI side effects Reports she occasionally checks her blood sugars, no recent readings >180, readings mostly run 110s-120s  Lab Results  Component Value Date   HGBA1C 8.6 (H) 01/23/2018   Hypertension -maintained on losartan 50 daily, carvedilol 3.125 mg BID Reports daily medication compliance without noted adverse medication effects. Reports she occasionally checks her blood pressure at work with normal readings  BP Readings from Last 3 Encounters:  07/28/18 120/70  06/26/18 99/79  03/04/18 (!) 157/79   Cholesterol- maintained on atorvastatin 40  Reports daily medication compliance without noted adverse medication effects  Lab Results  Component Value Date   CHOL 137 01/23/2018   HDL 55.20 01/23/2018   LDLCALC 66 01/23/2018   TRIG 79.0 01/23/2018   CHOLHDL 2 01/23/2018   Knee pain -  This is not a new problem, She has been experiencing increasing bilateral knee pain this year and is currently awaiting upcoming orthopedic evaluation. She was given a prescription for voltaren tablets in the ER for her knee pain which helped but has run out and requests refill today. She would also like  a cane to help steady her gait, shes been using a walking stick for support  Depression- maintained on wellbutrin SR 150 BID, reports daily medication compliance with adequate control of feelings of depression. She also likes the wellbutrin because it helped her stop smoking and decreases her cravings for cigarettes She has noticed her ears ringing since starting wellbutrin, and more recently shes had decreased hearing from her right ear   Patient Active Problem List   Diagnosis Date Noted  . Diabetes mellitus without complication ([place]) 76/72/0947  . Chronic sinusitis 03/29/2017  . Situational anxiety 11/19/2016  . Myocardial infarction ([place]) 11/19/2016  . Vitamin D deficiency 09/13/2015  . Generalized headaches 04/21/2015  . Osteoarthritis 04/21/2015  . Decreased libido 09/21/2014  . CARPAL TUNNEL SYNDROME, BILATERAL 12/03/2007  . Hypersomnia with sleep apnea 06/24/2007  . CAVERNOUS HEMANGIOMA 04/18/2007  . Hyperlipidemia with target LDL less than 70 12/10/2006  . Obesity 12/10/2006  . Depression 12/10/2006  . Essential hypertension 12/10/2006  . ALLERGIC RHINITIS 12/10/2006  . GERD 12/10/2006    Past Surgical History:  Procedure Laterality Date  . ABDOMINAL HYSTERECTOMY     Total   . BREAST SURGERY     BX right breast  . COLONOSCOPY W/ BIOPSIES AND POLYPECTOMY    . CORONARY ANGIOPLASTY WITH STENT PLACEMENT    . KNEE SURGERY Bilateral    arthroscopy  . LAPAROSCOPIC APPENDECTOMY N/A 12/10/2015   Procedure: APPENDECTOMY LAPAROSCOPIC;  Surgeon: Erroll Luna, MD;  Location: [facility];  Service: General;  Laterality:  N/A;  . MULTIPLE TOOTH EXTRACTIONS    . NASAL/SINUS ENDOSCOPY Bilateral 03/29/2017   ENDOSCOPIC SINUS SURGERY WITH NAVIGATION (Bilateral)  . SINUS ENDO W/FUSION Bilateral 03/29/2017   Procedure: ENDOSCOPIC SINUS SURGERY WITH NAVIGATION;  Surgeon: Melissa Montane, MD;  Location: [facility];  Service: ENT;  Laterality: Bilateral;  . TUBAL LIGATION    . TURBINATE REDUCTION  Bilateral 03/29/2017   Procedure: TURBINATE REDUCTION;  Surgeon: Melissa Montane, MD;  Location: [facility] OR;  Service: ENT;  Laterality: Bilateral;    Family History  Problem Relation Age of Onset  . Hypertension Mother   . Arthritis Mother   . Heart attack Father        In his 54's  . Ovarian cancer Maternal Grandmother   . Colon cancer Neg Hx   . Colon polyps Neg Hx   . Rectal cancer Neg Hx   . Stomach cancer Neg Hx     Social History   Socioeconomic History  . Marital status: Divorced    Spouse name: Not on file  . Number of children: Not on file  . Years of education: 89  . Highest education level: Not on file  Occupational History  . Occupation: Optician, dispensing: [employer]  . Financial resource strain: Not on file  . Food insecurity:    Worry: Not on file    Inability: Not on file  . Transportation needs:    Medical: Not on file    Non-medical: Not on file  Tobacco Use  . Smoking status: Former Smoker    Packs/day: 0.50    Years: 42.00    Pack years: 21.00    Types: Cigarettes    Last attempt to quit: 01/03/2018    Years since quitting: 0.5  . Smokeless tobacco: Never Used  Substance and Sexual Activity  . Alcohol use: Yes    Alcohol/week: 0.0 standard drinks    Comment: occ  . Drug use: No  . Sexual activity: Not on file  Lifestyle  . Physical activity:    Days per week: Not on file    Minutes per session: Not on file  . Stress: Not on file  Relationships  . Social connections:    Talks on phone: Not on file    Gets together: Not on file    Attends religious service: Not on file    Active member of club or organization: Not on file    Attends meetings of clubs or organizations: Not on file    Relationship status: Not on file  . Intimate partner violence:    Fear of current or ex partner: Not on file    Emotionally abused: Not on file    Physically abused: Not on file    Forced sexual activity: Not on file  Other Topics Concern  . Not  on file  Social History Narrative   Born and raised in [place], [place]. 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 2 children (daughter 53) (daughter 46)   Plays with grandchildren   Recently started exercising with personal trainer   Started eating better     Current Outpatient Medications:  .  acetaminophen (TYLENOL) 500 MG tablet, Take 2 tablets (1,000 mg total) by mouth every 8 (eight) hours as needed for moderate pain., Disp: 30 tablet, Rfl: 0 .  aspirin EC 81 MG tablet, Take 81 mg by mouth daily., Disp: , Rfl:  .  atorvastatin (LIPITOR) 40 MG tablet, Take 40 mg by  mouth daily at 6 PM. , Disp: , Rfl:  .  blood glucose meter kit and supplies KIT, Dispense based on patient and insurance preference. Use up to four times daily as directed. DX CODE: E11.9, Disp: 1 each, Rfl: 0 .  blood glucose meter kit and supplies KIT, Dispense based on patient and insurance preference. Use up to four times daily as directed. Dx Code: E11.9, Disp: 1 each, Rfl: 0 .  buPROPion (WELLBUTRIN SR) 150 MG 12 hr tablet, Take 1 tablet (150 mg total) by mouth 2 (two) times daily., Disp: 180 tablet, Rfl: 0 .  carvedilol (COREG) 3.125 MG tablet, Take 3.125 mg by mouth 2 (two) times daily with a meal., Disp: , Rfl:  .  cetirizine (ZYRTEC) 10 MG tablet, Take 10 mg by mouth daily as needed for allergies., Disp: , Rfl:  .  cholecalciferol (VITAMIN D) 1000 units tablet, Take 1,000 Units by mouth daily., Disp: , Rfl:  .  diclofenac (VOLTAREN) 50 MG EC tablet, Take 1 tablet (50 mg total) by mouth 2 (two) times daily., Disp: 15 tablet, Rfl: 0 .  Diclofenac Sodium (PENNSAID) 2 % SOLN, Place 1 inch onto the skin 2 (two) times daily as needed., Disp: 2 g, Rfl: 0 .  fluticasone (FLONASE) 50 MCG/ACT nasal spray, Place 2 sprays into both nostrils daily., Disp: 16 g, Rfl: 0 .  losartan (COZAAR) 50 MG tablet, Take 50 mg by mouth daily., Disp: , Rfl:  .  metFORMIN (GLUCOPHAGE XR) 500 MG 24 hr tablet, Take 1 tablet (500 mg  total) by mouth 2 (two) times daily., Disp: 60 tablet, Rfl: 2 .  nitroGLYCERIN (NITROSTAT) 0.4 MG SL tablet, Place 0.4 mg under the tongue every 5 (five) minutes as needed for chest pain., Disp: , Rfl:  .  pantoprazole (PROTONIX) 40 MG tablet, Take 1 tablet (40 mg total) by mouth daily at 6 (six) AM., Disp: 30 tablet, Rfl: 3 .  ticagrelor (BRILINTA) 90 MG TABS tablet, Take 90 mg by mouth 2 (two) times daily., Disp: , Rfl:  .  topiramate (TOPAMAX) 50 MG tablet, TAKE 1 TAB BY MOUTH TWICE DAILY, Disp: 180 tablet, Rfl: 0 .  traMADol (ULTRAM) 50 MG tablet, Take 1 tablet (50 mg total) by mouth every 6 (six) hours as needed., Disp: 15 tablet, Rfl: 0 .  triamcinolone (NASACORT) 55 MCG/ACT AERO nasal inhaler, Place 2 sprays into the nose daily. (Patient taking differently: Place 1-2 sprays into the nose daily as needed (NASAL CONGESTION). ), Disp: , Rfl:   Allergies  Allergen Reactions  . Nicotine     Patch...caused a rash  . Ace Inhibitors Cough    REACTION: Dry cough-lisinopril      ROS See HPI  Objective  Vitals:   07/28/18 0815  BP: 120/70  Pulse: 62  SpO2: 98%  Weight: 243 lb (110.2 kg)  Height: '5\' 4"'  (1.626 m)    Body mass index is 41.71 kg/m.  Physical Exam Vital signs reviewed. Constitutional: Patient appears well-developed and well-nourished. No distress.  HENT: Head: Normocephalic and atraumatic. Ears: Bilateral TMs without erythema or effusion, Right TM visualized after irrigation; Nose: Nose normal. Mouth/Throat: Oropharynx is clear and moist. No oropharyngeal exudate.  Eyes: Conjunctivae and EOM are normal. Pupils are equal, round, and reactive to light. No scleral icterus.  Neck: Normal range of motion. Neck supple. Cardiovascular: Normal rate, regular rhythm and normal heart sounds.  No murmur heard.  Distal pulses intact. Pulmonary/Chest: Effort normal and breath sounds normal. No respiratory distress.  Musculoskeletal: No gross deformities Neurological: She is alert  and oriented to person, place, and time. No cranial nerve deficit. Coordination, balance, strength, speech are normal. Ambulatory with cane. Skin: Skin is warm and dry. No rash noted. No erythema.  Psychiatric: Patient has a normal mood and affect. behavior is normal. Judgment and thought content normal.   Assessment & Plan RTC in 1 month for F/U- anxiety and depression, tinninus- adjusting wellbutrin formula  Health maintenance: Screening for breast cancer- MM DIGITAL SCREENING BILATERAL; Future  Impacted cerumen of right ear Irrigation/lavage of right ear with removal of impacted cerumen and improved hearing after procedure Patient tolerated well  Tinnitus of both ears Seems to have started with wellbutrin per patient, will try adjusting her wellbutrin today to see if this improves RTC in 1 month for follow up- consider additional testing, treatment if tinnitus persists  Unsteady gait - For home use only DME [employer]

## 2018-07-28 NOTE — Assessment & Plan Note (Signed)
Stable Continue current medictations - losartan (COZAAR) 50 MG tablet; Take 1 tablet (50 mg total) by mouth daily.  Dispense: 90 tablet; Refill: 3

## 2018-07-28 NOTE — Assessment & Plan Note (Signed)
Due to reported side effect of tinnitus, will trial XL version to see if symptoms improve-dosing and side effects discussed RTC in 1 month for F/U - buPROPion (WELLBUTRIN XL) 150 MG 24 hr tablet; Take 1 tablet (150 mg total) by mouth daily.  Dispense: 30 tablet; Refill: 1

## 2018-07-28 NOTE — Assessment & Plan Note (Signed)
Discussed long term risks of oral NSAIDs and she would prefer to try topical NSAID for continue knee pain voltaren gel Rx sent- dosing and side effects discussed Continue follow up with orthopedics as planned - diclofenac sodium (VOLTAREN) 1 % GEL; Apply 4 g topically 4 (four) times daily.  Dispense: 100 g; Refill: 1

## 2018-07-28 NOTE — Patient Instructions (Addendum)
Please head downstairs for lab work. If any of your test results are critically abnormal, you will be contacted right away. Otherwise, I will contact you within a week about your test results and follow up recommendations  Please CHANGE wellbutrin SR twice daily to Wellbutrin XL 24 hour tablet 150 mg once daily  Please start voltaren gel to knee up to four times daily as needed.  Please return in about 1 month for follow up, so I can see how you are doing on the new wellbutrin

## 2018-07-28 NOTE — Assessment & Plan Note (Signed)
Stable Continue atorvastatin Labs are up to date

## 2018-07-29 ENCOUNTER — Emergency Department (HOSPITAL_BASED_OUTPATIENT_CLINIC_OR_DEPARTMENT_OTHER)
Admission: EM | Admit: 2018-07-29 | Discharge: 2018-07-29 | Disposition: A | Discharge status: Home / Self Care | Payer: BLUE CROSS/BLUE SHIELD | Attending: Emergency Medicine | Admitting: Emergency Medicine

## 2018-07-29 ENCOUNTER — Other Ambulatory Visit: Payer: Self-pay

## 2018-07-29 ENCOUNTER — Encounter (HOSPITAL_BASED_OUTPATIENT_CLINIC_OR_DEPARTMENT_OTHER): Payer: Self-pay | Admitting: *Deleted

## 2018-07-29 DIAGNOSIS — I10 Essential (primary) hypertension: Secondary | ICD-10-CM | POA: Insufficient documentation

## 2018-07-29 DIAGNOSIS — G8929 Other chronic pain: Secondary | ICD-10-CM

## 2018-07-29 DIAGNOSIS — E119 Type 2 diabetes mellitus without complications: Secondary | ICD-10-CM | POA: Insufficient documentation

## 2018-07-29 DIAGNOSIS — Z79899 Other long term (current) drug therapy: Secondary | ICD-10-CM | POA: Insufficient documentation

## 2018-07-29 DIAGNOSIS — Z87891 Personal history of nicotine dependence: Secondary | ICD-10-CM | POA: Diagnosis not present

## 2018-07-29 DIAGNOSIS — Z7982 Long term (current) use of aspirin: Secondary | ICD-10-CM | POA: Diagnosis not present

## 2018-07-29 DIAGNOSIS — M25561 Pain in right knee: Secondary | ICD-10-CM | POA: Insufficient documentation

## 2018-07-29 DIAGNOSIS — E785 Hyperlipidemia, unspecified: Secondary | ICD-10-CM | POA: Insufficient documentation

## 2018-07-29 DIAGNOSIS — Z7984 Long term (current) use of oral hypoglycemic drugs: Secondary | ICD-10-CM | POA: Insufficient documentation

## 2018-07-29 DIAGNOSIS — I252 Old myocardial infarction: Secondary | ICD-10-CM | POA: Diagnosis not present

## 2018-07-29 MED ORDER — DICLOFENAC SODIUM 25 MG PO TBEC: 25.0000 mg | DELAYED_RELEASE_TABLET | Freq: Two times a day (BID) | ORAL | 0 refills | Status: DC

## 2018-07-29 NOTE — ED Provider Notes (Signed)
[facility] EMERGENCY DEPARTMENT Provider Note   CSN: [id number] Arrival date & time: 07/29/18  2220     History   Chief Complaint Chief Complaint  Patient presents with  . Knee Pain    HPI Theresa Gibson is a 62 y.o. female.  The history is provided by the patient. No language interpreter was used.  Knee Pain     Theresa Gibson is a 62 y.o. female who presents to the Emergency Department complaining of knee pain. Resents to the emergency department for evaluation of right knee pain. She had a fall at work a few months ago and fell onto to flexed knees. She has been evaluated by her workers comp doctor and had MRIs performed that demonstrated torn menisci in both knees. She has had pain in her right knee since the fall. Overall over the last few days her pain is significantly worsened. It is located along the anterior knee and the medial joint line. Pain is worse with ambulation. She has taken diclofenac with mild improvement as well as tramadol with significant improvement. She is currently out of both diclofenac and tramadol. She currently does not have a referral to orthopedics at this time. She denies any fevers, swelling, chest pain, shortness of breath. She does feel chills at times. She is using a cane to ambulate secondary to pain.  Past Medical History:  Diagnosis Date  . Acute MI, inferior wall ([place]) 08/2016   s/p DES x 2 to the RCA  . Allergy   . Arthritis   . Depression   . GERD (gastroesophageal reflux disease)   . Hypertension   . Meningioma ([place])   . PONV (postoperative nausea and vomiting)    nausea only  . Wears dentures    upper    Patient Active Problem List   Diagnosis Date Noted  . Chronic pain of both knees 07/28/2018  . Diabetes mellitus without complication ([place]) 79/15/0569  . Chronic sinusitis 03/29/2017  . Situational anxiety 11/19/2016  . Myocardial infarction ([place]) 11/19/2016  . Vitamin D deficiency 09/13/2015  . Generalized  headaches 04/21/2015  . Osteoarthritis 04/21/2015  . Decreased libido 09/21/2014  . CARPAL TUNNEL SYNDROME, BILATERAL 12/03/2007  . Hypersomnia with sleep apnea 06/24/2007  . CAVERNOUS HEMANGIOMA 04/18/2007  . Hyperlipidemia with target LDL less than 70 12/10/2006  . Obesity 12/10/2006  . Depression 12/10/2006  . Essential hypertension 12/10/2006  . ALLERGIC RHINITIS 12/10/2006  . GERD 12/10/2006    Past Surgical History:  Procedure Laterality Date  . ABDOMINAL HYSTERECTOMY     Total   . BREAST SURGERY     BX right breast  . COLONOSCOPY W/ BIOPSIES AND POLYPECTOMY    . CORONARY ANGIOPLASTY WITH STENT PLACEMENT    . KNEE SURGERY Bilateral    arthroscopy  . LAPAROSCOPIC APPENDECTOMY N/A 12/10/2015   Procedure: APPENDECTOMY LAPAROSCOPIC;  Surgeon: Erroll Luna, MD;  Location: [place];  Service: General;  Laterality: N/A;  . MULTIPLE TOOTH EXTRACTIONS    . NASAL/SINUS ENDOSCOPY Bilateral 03/29/2017   ENDOSCOPIC SINUS SURGERY WITH NAVIGATION (Bilateral)  . SINUS ENDO W/FUSION Bilateral 03/29/2017   Procedure: ENDOSCOPIC SINUS SURGERY WITH NAVIGATION;  Surgeon: Melissa Montane, MD;  Location: [place];  Service: ENT;  Laterality: Bilateral;  . TUBAL LIGATION    . TURBINATE REDUCTION Bilateral 03/29/2017   Procedure: TURBINATE REDUCTION;  Surgeon: Melissa Montane, MD;  Location: [place];  Service: ENT;  Laterality: Bilateral;     OB History   None  Home Medications    Prior to Admission medications   Medication Sig Start Date End Date Taking? Authorizing Provider  acetaminophen (TYLENOL) 500 MG tablet Take 2 tablets (1,000 mg total) by mouth every 8 (eight) hours as needed for moderate pain. 09/30/17   Nche, Charlene Brooke, NP  aspirin EC 81 MG tablet Take 81 mg by mouth daily.    [provider]  atorvastatin (LIPITOR) 40 MG tablet Take 40 mg by mouth daily at 6 PM.     [provider]  blood glucose meter kit and supplies KIT Dispense based on patient and insurance  preference. Use up to four times daily as directed. DX CODE: E11.9 01/28/18   Lance Sell, NP  blood glucose meter kit and supplies KIT Dispense based on patient and insurance preference. Use up to four times daily as directed. Dx Code: E11.9 02/26/18   Lance Sell, NP  buPROPion (WELLBUTRIN XL) 150 MG 24 hr tablet Take 1 tablet (150 mg total) by mouth daily. 07/28/18   Lance Sell, NP  carvedilol (COREG) 3.125 MG tablet Take 3.125 mg by mouth 2 (two) times daily with a meal.    [provider]  cetirizine (ZYRTEC) 10 MG tablet Take 10 mg by mouth daily as needed for allergies.    [provider]  cholecalciferol (VITAMIN D) 1000 units tablet Take 1,000 Units by mouth daily.    [provider]  diclofenac (VOLTAREN) 25 MG EC tablet Take 1 tablet (25 mg total) by mouth 2 (two) times daily. 07/29/18   Quintella Reichert, MD  diclofenac sodium (VOLTAREN) 1 % GEL Apply 4 g topically 4 (four) times daily. 07/28/18   Lance Sell, NP  fluticasone (FLONASE) 50 MCG/ACT nasal spray Place 2 sprays into both nostrils daily. 12/20/17   Palumbo, April, MD  losartan (COZAAR) 50 MG tablet Take 1 tablet (50 mg total) by mouth daily. 07/28/18   Lance Sell, NP  metFORMIN (GLUCOPHAGE XR) 500 MG 24 hr tablet Take 1 tablet (500 mg total) by mouth 2 (two) times daily. 07/28/18   Lance Sell, NP  nitroGLYCERIN (NITROSTAT) 0.4 MG SL tablet Place 0.4 mg under the tongue every 5 (five) minutes as needed for chest pain.    [provider]  pantoprazole (PROTONIX) 40 MG tablet Take 1 tablet (40 mg total) by mouth daily at 6 (six) AM. 03/02/17   Bhagat, Bhavinkumar, PA  topiramate (TOPAMAX) 50 MG tablet TAKE 1 TAB BY MOUTH TWICE DAILY 08/22/17   Golden Circle, FNP  traMADol (ULTRAM) 50 MG tablet Take 1 tablet (50 mg total) by mouth every 6 (six) hours as needed. 06/26/18   Ashley Murrain, NP  triamcinolone (NASACORT) 55 MCG/ACT AERO nasal inhaler Place  2 sprays into the nose daily. Patient taking differently: Place 1-2 sprays into the nose daily as needed (NASAL CONGESTION).  01/04/17   Molpus, John, MD    Family History Family History  Problem Relation Age of Onset  . Hypertension Mother   . Arthritis Mother   . Heart attack Father        In his 18's  . Ovarian cancer Maternal Grandmother   . Colon cancer Neg Hx   . Colon polyps Neg Hx   . Rectal cancer Neg Hx   . Stomach cancer Neg Hx     Social History Social History   Tobacco Use  . Smoking status: Former Smoker    Packs/day: 0.50    Years:  42.00    Pack years: 21.00    Types: Cigarettes    Last attempt to quit: 01/03/2018    Years since quitting: 0.5  . Smokeless tobacco: Never Used  Substance Use Topics  . Alcohol use: Yes    Alcohol/week: 0.0 standard drinks    Comment: occ  . Drug use: No     Allergies   Nicotine and Ace inhibitors   Review of Systems Review of Systems  All other systems reviewed and are negative.    Physical Exam Updated Vital Signs BP (!) 163/85   Pulse 73   Temp 97.7 F (36.5 C) (Oral)   Resp 18   Ht '5\' 4"'  (1.626 m)   Wt 110.2 kg   SpO2 100%   BMI 41.70 kg/m   Physical Exam  Constitutional: She is oriented to person, place, and time. She appears well-developed and well-nourished. No distress.  HENT:  Head: Normocephalic and atraumatic.  Cardiovascular: Normal rate.  Pulmonary/Chest: Effort normal. No respiratory distress.  Musculoskeletal: Normal range of motion. She exhibits no edema or deformity.  2+ DP pulses bilaterally. There is mild tenderness to palpation over the heel joint line of the right knee. There is no erythema or fusion in the knee. Flexion and extension are intact and the knee.  Neurological: She is alert and oriented to person, place, and time.  Skin: Skin is warm and dry. Capillary refill takes less than 2 seconds.  Psychiatric: She has a normal mood and affect. Her behavior is normal.  Nursing note  and vitals reviewed.    ED Treatments / Results  Labs (all labs ordered are listed, but only abnormal results are displayed) Labs Reviewed - No data to display  EKG None  Radiology No results found.  Procedures Procedures (including critical care time)  Medications Ordered in ED Medications - No data to display   Initial Impression / Assessment and Plan / ED Course  I have reviewed the triage vital signs and the nursing notes.  Pertinent labs & imaging results that were available during my care of the patient were reviewed by me and considered in my medical decision making (see chart for details).     Patient here for evaluation of knee pain following a fall a few months ago. She is already had outpatient imaging demonstrating a torn meniscus. She is neurovascular early intact on examination with no concerning features for septic or gouty arthritis. No historical features for new injury. Discussed with patient home care for arthralgia of the knee. Discussed over-the-counter Tylenol, will prescribe diclofenac as well. Discussed the tramadol is not an appropriate medication at this time due to the chronic nature of her pain. Discussed importance of orthopedics follow-up as well as return precautions.  Final Clinical Impressions(s) / ED Diagnoses   Final diagnoses:  Chronic pain of right knee    ED Discharge Orders         Ordered    diclofenac (VOLTAREN) 25 MG EC tablet  2 times daily     07/29/18 2316           Quintella Reichert, MD 07/29/18 2321

## 2018-07-29 NOTE — ED Triage Notes (Addendum)
Right knee pain. She fell 2 months ago at work and tore her meniscus. This is workmans comp. They have not given her a orthopedic referral. She was seen by her MD yesterday and was given Voltaren Gel but it is not helping the pain. She is here tonight to see if she can get a Rx for Tramadol.

## 2018-07-30 ENCOUNTER — Other Ambulatory Visit: Payer: Self-pay | Admitting: Nurse Practitioner

## 2018-07-30 DIAGNOSIS — E119 Type 2 diabetes mellitus without complications: Secondary | ICD-10-CM

## 2018-07-30 MED ORDER — METFORMIN HCL ER 500 MG PO TB24: 1000.0000 mg | ORAL_TABLET | Freq: Two times a day (BID) | ORAL | 2 refills | Status: DC

## 2018-08-21 ENCOUNTER — Other Ambulatory Visit: Payer: Self-pay | Admitting: Internal Medicine

## 2018-08-27 ENCOUNTER — Ambulatory Visit: Payer: Self-pay

## 2018-09-01 ENCOUNTER — Encounter: Payer: Self-pay | Admitting: Nurse Practitioner

## 2018-09-01 ENCOUNTER — Ambulatory Visit (INDEPENDENT_AMBULATORY_CARE_PROVIDER_SITE_OTHER): Payer: BLUE CROSS/BLUE SHIELD | Admitting: Nurse Practitioner

## 2018-09-01 VITALS — BP 118/80 | HR 79 | Ht 64.0 in | Wt 242.0 lb

## 2018-09-01 DIAGNOSIS — F32A Depression, unspecified: Secondary | ICD-10-CM

## 2018-09-01 DIAGNOSIS — F419 Anxiety disorder, unspecified: Secondary | ICD-10-CM | POA: Diagnosis not present

## 2018-09-01 DIAGNOSIS — G4700 Insomnia, unspecified: Secondary | ICD-10-CM

## 2018-09-01 DIAGNOSIS — Z23 Encounter for immunization: Secondary | ICD-10-CM

## 2018-09-01 DIAGNOSIS — E119 Type 2 diabetes mellitus without complications: Secondary | ICD-10-CM | POA: Diagnosis not present

## 2018-09-01 DIAGNOSIS — H9313 Tinnitus, bilateral: Secondary | ICD-10-CM

## 2018-09-01 DIAGNOSIS — F329 Major depressive disorder, single episode, unspecified: Secondary | ICD-10-CM

## 2018-09-01 MED ORDER — VENLAFAXINE HCL ER 75 MG PO CP24: 75.0000 mg | ORAL_CAPSULE | Freq: Every day | ORAL | 1 refills | Status: DC

## 2018-09-01 MED ORDER — METFORMIN HCL ER 500 MG PO TB24: 1000.0000 mg | ORAL_TABLET | Freq: Two times a day (BID) | ORAL | 2 refills | Status: DC

## 2018-09-01 MED ORDER — BUPROPION HCL ER (XL) 150 MG PO TB24: 150.0000 mg | ORAL_TABLET | Freq: Every day | ORAL | 1 refills | Status: DC

## 2018-09-01 NOTE — Assessment & Plan Note (Signed)
Instructed to increased metformin to 1000 BID She will let me know if she experiences any adverse effects Discussed the role of healthy diet and exercise in the management of DM, additional education provided on AVS Will plan to recheck A1c in about 3 months- around 11/2018 - metFORMIN (GLUCOPHAGE XR) 500 MG 24 hr tablet; Take 2 tablets (1,000 mg total) by mouth 2 (two) times daily.  Dispense: 120 tablet; Refill: 2

## 2018-09-01 NOTE — Progress Notes (Signed)
Name: Theresa Gibson   MRN: [id number]    DOB: 1956/09/08   Date:09/01/2018       Progress Note  Subjective  Chief Complaint Follow up  HPI Theresa Gibson is here today for follow up anxiety and depression, diabetes.  I last saw her on 07/28/18 and we decided to switch her wellbutrin to XL version due to a possible side effects of tinnitus. She returns today for follow up, has switched to XL version of wellbutrin, says her ears continue to ring. The tinnitus occurs daily, she describes as "humming" in bilateral ears.She has been on wellbutrin for quite some time for anxiety and depression, she does like wellbutrin but says she does continue to experience some daily symptoms of anxiety and depression, feeling restless, trouble relaxing, lies awake worrying about things at night- her job, bills, knee pain. She was on celexa in the past, but cant remember how it made her feel, this was many years ago. She denies SI, HI.  Diabetes- She was instructed to increase metformin to 1000 BID after 8/26 A1c was elevated, but says she did not understand the instructions to increase and has been taking only 500 BID still. She has been trying to watch her diet, realizes overeating is a problem for her, but has been trying to reduce sweets and watch portions. It is hard for her to exercise due to current knee pain/problems, seeing orthopedic doctor this coming Friday for this problem  Lab Results  Component Value Date   HGBA1C 7.7 (H) 07/28/2018      Patient Active Problem List   Diagnosis Date Noted  . Chronic pain of both knees 07/28/2018  . Diabetes mellitus without complication ([place]) 14/97/0263  . Chronic sinusitis 03/29/2017  . Situational anxiety 11/19/2016  . Myocardial infarction ([place]) 11/19/2016  . Vitamin D deficiency 09/13/2015  . Generalized headaches 04/21/2015  . Osteoarthritis 04/21/2015  . Decreased libido 09/21/2014  . CARPAL TUNNEL SYNDROME, BILATERAL 12/03/2007  . Hypersomnia with sleep  apnea 06/24/2007  . CAVERNOUS HEMANGIOMA 04/18/2007  . Hyperlipidemia with target LDL less than 70 12/10/2006  . Obesity 12/10/2006  . Depression 12/10/2006  . Essential hypertension 12/10/2006  . ALLERGIC RHINITIS 12/10/2006  . GERD 12/10/2006    Past Surgical History:  Procedure Laterality Date  . ABDOMINAL HYSTERECTOMY     Total   . BREAST SURGERY     BX right breast  . COLONOSCOPY W/ BIOPSIES AND POLYPECTOMY    . CORONARY ANGIOPLASTY WITH STENT PLACEMENT    . KNEE SURGERY Bilateral    arthroscopy  . LAPAROSCOPIC APPENDECTOMY N/A 12/10/2015   Procedure: APPENDECTOMY LAPAROSCOPIC;  Surgeon: Erroll Luna, MD;  Location: [facility];  Service: General;  Laterality: N/A;  . MULTIPLE TOOTH EXTRACTIONS    . NASAL/SINUS ENDOSCOPY Bilateral 03/29/2017   ENDOSCOPIC SINUS SURGERY WITH NAVIGATION (Bilateral)  . SINUS ENDO W/FUSION Bilateral 03/29/2017   Procedure: ENDOSCOPIC SINUS SURGERY WITH NAVIGATION;  Surgeon: Melissa Montane, MD;  Location: [facility];  Service: ENT;  Laterality: Bilateral;  . TUBAL LIGATION    . TURBINATE REDUCTION Bilateral 03/29/2017   Procedure: TURBINATE REDUCTION;  Surgeon: Melissa Montane, MD;  Location: [facility];  Service: ENT;  Laterality: Bilateral;    Family History  Problem Relation Age of Onset  . Hypertension Mother   . Arthritis Mother   . Heart attack Father        In his 47's  . Ovarian cancer Maternal Grandmother   . Colon cancer Neg Hx   .  Colon polyps Neg Hx   . Rectal cancer Neg Hx   . Stomach cancer Neg Hx     Social History   Socioeconomic History  . Marital status: Divorced    Spouse name: Not on file  . Number of children: Not on file  . Years of education: 55  . Highest education level: Not on file  Occupational History  . Occupation: Optician, dispensing: [employer]  . Financial resource strain: Not on file  . Food insecurity:    Worry: Not on file    Inability: Not on file  . Transportation needs:    Medical: Not on file     Non-medical: Not on file  Tobacco Use  . Smoking status: Former Smoker    Packs/day: 0.50    Years: 42.00    Pack years: 21.00    Types: Cigarettes    Last attempt to quit: 01/03/2018    Years since quitting: 0.6  . Smokeless tobacco: Never Used  Substance and Sexual Activity  . Alcohol use: Yes    Alcohol/week: 0.0 standard drinks    Comment: occ  . Drug use: No  . Sexual activity: Not on file  Lifestyle  . Physical activity:    Days per week: Not on file    Minutes per session: Not on file  . Stress: Not on file  Relationships  . Social connections:    Talks on phone: Not on file    Gets together: Not on file    Attends religious service: Not on file    Active member of club or organization: Not on file    Attends meetings of clubs or organizations: Not on file    Relationship status: Not on file  . Intimate partner violence:    Fear of current or ex partner: Not on file    Emotionally abused: Not on file    Physically abused: Not on file    Forced sexual activity: Not on file  Other Topics Concern  . Not on file  Social History Narrative   Born and raised in [place], [place]. 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 2 children (daughter 62) (daughter 1)   Plays with grandchildren   Recently started exercising with personal trainer   Started eating better     Current Outpatient Medications:  .  acetaminophen (TYLENOL) 500 MG tablet, Take 2 tablets (1,000 mg total) by mouth every 8 (eight) hours as needed for moderate pain., Disp: 30 tablet, Rfl: 0 .  aspirin EC 81 MG tablet, Take 81 mg by mouth daily., Disp: , Rfl:  .  atorvastatin (LIPITOR) 40 MG tablet, Take 40 mg by mouth daily at 6 PM. , Disp: , Rfl:  .  blood glucose meter kit and supplies KIT, Dispense based on patient and insurance preference. Use up to four times daily as directed. DX CODE: E11.9, Disp: 1 each, Rfl: 0 .  blood glucose meter kit and supplies KIT, Dispense based on patient and  insurance preference. Use up to four times daily as directed. Dx Code: E11.9, Disp: 1 each, Rfl: 0 .  buPROPion (WELLBUTRIN XL) 150 MG 24 hr tablet, Take 1 tablet (150 mg total) by mouth daily., Disp: 30 tablet, Rfl: 1 .  carvedilol (COREG) 3.125 MG tablet, Take 3.125 mg by mouth 2 (two) times daily with a meal., Disp: , Rfl:  .  cetirizine (ZYRTEC) 10 MG tablet, Take 10 mg by mouth daily as needed  for allergies., Disp: , Rfl:  .  cholecalciferol (VITAMIN D) 1000 units tablet, Take 1,000 Units by mouth daily., Disp: , Rfl:  .  diclofenac (VOLTAREN) 25 MG EC tablet, Take 1 tablet (25 mg total) by mouth 2 (two) times daily., Disp: 14 tablet, Rfl: 0 .  diclofenac sodium (VOLTAREN) 1 % GEL, Apply 4 g topically 4 (four) times daily., Disp: 100 g, Rfl: 1 .  fluticasone (FLONASE) 50 MCG/ACT nasal spray, Place 2 sprays into both nostrils daily., Disp: 16 g, Rfl: 0 .  losartan (COZAAR) 50 MG tablet, Take 1 tablet (50 mg total) by mouth daily., Disp: 90 tablet, Rfl: 3 .  metFORMIN (GLUCOPHAGE XR) 500 MG 24 hr tablet, Take 2 tablets (1,000 mg total) by mouth 2 (two) times daily., Disp: 120 tablet, Rfl: 2 .  nitroGLYCERIN (NITROSTAT) 0.4 MG SL tablet, Place 0.4 mg under the tongue every 5 (five) minutes as needed for chest pain., Disp: , Rfl:  .  pantoprazole (PROTONIX) 40 MG tablet, Take 1 tablet (40 mg total) by mouth daily at 6 (six) AM., Disp: 30 tablet, Rfl: 3 .  topiramate (TOPAMAX) 50 MG tablet, TAKE 1 TAB BY MOUTH TWICE DAILY, Disp: 180 tablet, Rfl: 0 .  traMADol (ULTRAM) 50 MG tablet, Take 1 tablet (50 mg total) by mouth every 6 (six) hours as needed., Disp: 15 tablet, Rfl: 0 .  triamcinolone (NASACORT) 55 MCG/ACT AERO nasal inhaler, Place 2 sprays into the nose daily. (Patient taking differently: Place 1-2 sprays into the nose daily as needed (NASAL CONGESTION). ), Disp: , Rfl:   Allergies  Allergen Reactions  . Nicotine     Patch...caused a rash  . Ace Inhibitors Cough    REACTION: Dry  cough-lisinopril      ROS Vital signs reviewed.  Objective  Vitals:   09/01/18 0824  BP: 118/80  Pulse: 79  SpO2: 98%  Weight: 242 lb (109.8 kg)  Height: '5\' 4"'  (1.626 m)     Body mass index is 41.54 kg/m.  Physical Exam Vital signs reviewed. Constitutional: Patient appears well-developed and well-nourished. No distress.  HENT: Head: Normocephalic and atraumatic. Ears: Bilateral TMs without erythema or effusion; Nose: Nose normal. Mouth/Throat: Oropharynx is clear and moist. No oropharyngeal exudate.  Eyes: Conjunctivae and EOM are normal. Pupils are equal, round, and reactive to light. No scleral icterus.  Neck: Normal range of motion. Neck supple. Cardiovascular: Normal rate, regular rhythm and normal heart sounds.  No murmur heard. Distal pulses intact. Pulmonary/Chest: Effort normal and breath sounds normal. No respiratory distress. Neurological: She is alert and oriented to person, place, and time. No cranial nerve deficit. Coordination, balance, strength, speech and gait are normal.  Skin: Skin is warm and dry. No rash noted. No erythema.  Psychiatric: Patient has a normal mood and affect. behavior is normal. Judgment and thought content normal.    Assessment & Plan RTC in 1 month for F/U: Anxiety and depression, insomnia, tinnitus- switching wellbutrin to effexor   -Reviewed Health Maintenance:  Need for influenza vaccination- Flu Vaccine QUAD 36+ mos IM  Insomnia, unspecified type It sounds like her anxiety/depression are major contributor, will work on plan for this today RTC in 1 month for F/U  Tinnitus of both ears Could be related to wellbutrin, we will discontinue wellbutrin today to see if this resolves RTC In 1 month for F/u

## 2018-09-01 NOTE — Patient Instructions (Addendum)
Stop wellbutrin   Start effexor 75mg  once daily. Some side effects such as nausea, drowsiness and weight gain can occur.  Also rarely people have experienced suicidal thoughts when taking this medication.  Please discontinue the medication and go directly to ED if this occurs.  Id like to see you back in about 1 month to evaluate progress.    Please increase metformin to 1000 mg twice daily, let me know if you are not able to tolerate this dosage.  Please return in about 1 month for follow up, so I can see how you are doing on the new medication Diabetes Mellitus and Nutrition When you have diabetes (diabetes mellitus), it is very important to have healthy eating habits because your blood sugar (glucose) levels are greatly affected by what you eat and drink. Eating healthy foods in the appropriate amounts, at about the same times every day, can help you:  Control your blood glucose.  Lower your risk of heart disease.  Improve your blood pressure.  Reach or maintain a healthy weight.  Every person with diabetes is different, and each person has different needs for a meal plan. Your health care provider may recommend that you work with a diet and nutrition specialist (dietitian) to make a meal plan that is best for you. Your meal plan may vary depending on factors such as:  The calories you need.  The medicines you take.  Your weight.  Your blood glucose, blood pressure, and cholesterol levels.  Your activity level.  Other health conditions you have, such as heart or kidney disease.  How do carbohydrates affect me? Carbohydrates affect your blood glucose level more than any other type of food. Eating carbohydrates naturally increases the amount of glucose in your blood. Carbohydrate counting is a method for keeping track of how many carbohydrates you eat. Counting carbohydrates is important to keep your blood glucose at a healthy level, especially if you use insulin or take certain  oral diabetes medicines. It is important to know how many carbohydrates you can safely have in each meal. This is different for every person. Your dietitian can help you calculate how many carbohydrates you should have at each meal and for snack. Foods that contain carbohydrates include:  Bread, cereal, rice, pasta, and crackers.  Potatoes and corn.  Peas, beans, and lentils.  Milk and yogurt.  Fruit and juice.  Desserts, such as cakes, cookies, ice cream, and candy.  How does alcohol affect me? Alcohol can cause a sudden decrease in blood glucose (hypoglycemia), especially if you use insulin or take certain oral diabetes medicines. Hypoglycemia can be a life-threatening condition. Symptoms of hypoglycemia (sleepiness, dizziness, and confusion) are similar to symptoms of having too much alcohol. If your health care provider says that alcohol is safe for you, follow these guidelines:  Limit alcohol intake to no more than 1 drink per day for nonpregnant women and 2 drinks per day for men. One drink equals 12 oz of beer, 5 oz of wine, or 1 oz of hard liquor.  Do not drink on an empty stomach.  Keep yourself hydrated with water, diet soda, or unsweetened iced tea.  Keep in mind that regular soda, juice, and other mixers may contain a lot of sugar and must be counted as carbohydrates.  What are tips for following this plan? Reading food labels  Start by checking the serving size on the label. The amount of calories, carbohydrates, fats, and other nutrients listed on the label are based  on one serving of the food. Many foods contain more than one serving per package.  Check the total grams (g) of carbohydrates in one serving. You can calculate the number of servings of carbohydrates in one serving by dividing the total carbohydrates by 15. For example, if a food has 30 g of total carbohydrates, it would be equal to 2 servings of carbohydrates.  Check the number of grams (g) of saturated  and trans fats in one serving. Choose foods that have low or no amount of these fats.  Check the number of milligrams (mg) of sodium in one serving. Most people should limit total sodium intake to less than 2,300 mg per day.  Always check the nutrition information of foods labeled as "low-fat" or "nonfat". These foods may be higher in added sugar or refined carbohydrates and should be avoided.  Talk to your dietitian to identify your daily goals for nutrients listed on the label. Shopping  Avoid buying canned, premade, or processed foods. These foods tend to be high in fat, sodium, and added sugar.  Shop around the outside edge of the grocery store. This includes fresh fruits and vegetables, bulk grains, fresh meats, and fresh dairy. Cooking  Use low-heat cooking methods, such as baking, instead of high-heat cooking methods like deep frying.  Cook using healthy oils, such as olive, canola, or sunflower oil.  Avoid cooking with butter, cream, or high-fat meats. Meal planning  Eat meals and snacks regularly, preferably at the same times every day. Avoid going long periods of time without eating.  Eat foods high in fiber, such as fresh fruits, vegetables, beans, and whole grains. Talk to your dietitian about how many servings of carbohydrates you can eat at each meal.  Eat 4-6 ounces of lean protein each day, such as lean meat, chicken, fish, eggs, or tofu. 1 ounce is equal to 1 ounce of meat, chicken, or fish, 1 egg, or 1/4 cup of tofu.  Eat some foods each day that contain healthy fats, such as avocado, nuts, seeds, and fish. Lifestyle   Check your blood glucose regularly.  Exercise at least 30 minutes 5 or more days each week, or as told by your health care provider.  Take medicines as told by your health care provider.  Do not use any products that contain nicotine or tobacco, such as cigarettes and e-cigarettes. If you need help quitting, ask your health care provider.  Work  with a Social worker or diabetes educator to identify strategies to manage stress and any emotional and social challenges. What are some questions to ask my health care provider?  Do I need to meet with a diabetes educator?  Do I need to meet with a dietitian?  What number can I call if I have questions?  When are the best times to check my blood glucose? Where to find more information:  American Diabetes Association: diabetes.org/food-and-fitness/food  Academy of Nutrition and Dietetics: PokerClues.dk  [employer] of Diabetes and Digestive and Kidney Diseases (NIH): ContactWire.be Summary  A healthy meal plan will help you control your blood glucose and maintain a healthy lifestyle.  Working with a diet and nutrition specialist (dietitian) can help you make a meal plan that is best for you.  Keep in mind that carbohydrates and alcohol have immediate effects on your blood glucose levels. It is important to count carbohydrates and to use alcohol carefully. This information is not intended to replace advice given to you by your health care provider. Make sure  you discuss any questions you have with your health care provider. Document Released: 08/16/2005 Document Revised: 12/24/2016 Document Reviewed: 12/24/2016 Elsevier Interactive Patient Education  [employer].

## 2018-09-01 NOTE — Assessment & Plan Note (Signed)
Due to possible side effect of tinnitus and continued symptoms of anxiety/depression, will stop wellbutrin, start effexor-dosing and side effects discussed She declines counseling referral RTC in 1 month for F/U- can increase effexor dosage if indicated - venlafaxine XR (EFFEXOR XR) 75 MG 24 hr capsule; Take 1 capsule (75 mg total) by mouth daily with breakfast.  Dispense: 30 capsule; Refill: 1

## 2018-09-04 ENCOUNTER — Ambulatory Visit
Admission: RE | Admit: 2018-09-04 | Discharge: 2018-09-04 | Disposition: A | Discharge status: Home / Self Care | Payer: BLUE CROSS/BLUE SHIELD | Source: Ambulatory Visit | Attending: Nurse Practitioner | Admitting: Nurse Practitioner

## 2018-09-04 DIAGNOSIS — Z1231 Encounter for screening mammogram for malignant neoplasm of breast: Secondary | ICD-10-CM | POA: Diagnosis not present

## 2018-09-04 DIAGNOSIS — Z1239 Encounter for other screening for malignant neoplasm of breast: Secondary | ICD-10-CM

## 2018-09-05 DIAGNOSIS — M1711 Unilateral primary osteoarthritis, right knee: Secondary | ICD-10-CM | POA: Diagnosis not present

## 2018-09-05 DIAGNOSIS — M170 Bilateral primary osteoarthritis of knee: Secondary | ICD-10-CM | POA: Diagnosis not present

## 2018-09-09 ENCOUNTER — Telehealth: Payer: Self-pay | Admitting: *Deleted

## 2018-09-09 NOTE — Telephone Encounter (Signed)
Left mess for patient to call back. Need to know 1. Is she checking her CBGs when she feels weak and nauseated and also let her know PCP is out of office until 09/10/18  Also, 2. If the Venlafaxine 75 mg is helping with anxiety/depression.     Copied from [place] [id number]. Topic: General - Other >> Sep 09, 2018 11:34 AM Yvette Rack wrote: Reason for CRM: Pt states the metformin was increased and she prescribed a new medication and together the medications are making her feel nauseated and weak. Pt would like to know if she should be doing something different. Pt requests a call back. Cb# [phone]

## 2018-09-10 NOTE — Telephone Encounter (Signed)
Called patient again. No answer/unable to leave vm. Mailbox is full. Will try again later.

## 2018-09-11 NOTE — Telephone Encounter (Signed)
Patient returned call and confirmed contact # is correct. Patient states she will be waiting on the call

## 2018-09-11 NOTE — Telephone Encounter (Signed)
I called pt back- NO answer/unable to leave vm. PEC please ask patient the following:  1. Is she checking her CBGs when she feels weak and nauseated? 2. If the Venlafaxine 75 mg is helping with anxiety/depression?  3. Is she still having nausea and feeling weak now 2 days after original call?

## 2018-09-12 NOTE — Telephone Encounter (Signed)
Called patient again- no answer. Left mess for patient to call back.

## 2018-09-17 DIAGNOSIS — M25562 Pain in left knee: Secondary | ICD-10-CM | POA: Diagnosis not present

## 2018-09-17 DIAGNOSIS — M170 Bilateral primary osteoarthritis of knee: Secondary | ICD-10-CM | POA: Diagnosis not present

## 2018-10-06 ENCOUNTER — Ambulatory Visit: Payer: Self-pay | Admitting: Nurse Practitioner

## 2018-10-15 ENCOUNTER — Telehealth: Payer: Self-pay | Admitting: Nurse Practitioner

## 2018-10-15 ENCOUNTER — Ambulatory Visit: Payer: BLUE CROSS/BLUE SHIELD | Admitting: Nurse Practitioner

## 2018-10-15 ENCOUNTER — Encounter: Payer: Self-pay | Admitting: Nurse Practitioner

## 2018-10-15 VITALS — BP 140/82 | HR 81 | Ht 64.0 in | Wt 245.0 lb

## 2018-10-15 DIAGNOSIS — F329 Major depressive disorder, single episode, unspecified: Secondary | ICD-10-CM

## 2018-10-15 DIAGNOSIS — F32A Depression, unspecified: Secondary | ICD-10-CM

## 2018-10-15 DIAGNOSIS — H9313 Tinnitus, bilateral: Secondary | ICD-10-CM | POA: Diagnosis not present

## 2018-10-15 DIAGNOSIS — E119 Type 2 diabetes mellitus without complications: Secondary | ICD-10-CM | POA: Diagnosis not present

## 2018-10-15 DIAGNOSIS — F419 Anxiety disorder, unspecified: Secondary | ICD-10-CM

## 2018-10-15 DIAGNOSIS — J3489 Other specified disorders of nose and nasal sinuses: Secondary | ICD-10-CM | POA: Diagnosis not present

## 2018-10-15 MED ORDER — BUPROPION HCL ER (SR) 150 MG PO TB12: 150.0000 mg | ORAL_TABLET | Freq: Two times a day (BID) | ORAL | 0 refills | Status: DC

## 2018-10-15 MED ORDER — EMPAGLIFLOZIN 10 MG PO TABS: 10.0000 mg | ORAL_TABLET | Freq: Every day | ORAL | 3 refills | Status: DC

## 2018-10-15 NOTE — Telephone Encounter (Signed)
Copied from [place] [id number]. Topic: General - Other >> Oct 15, 2018  5:26 PM Cecelia Byars, NT wrote: Reason for CRM: Patient called and would like to know if the practice has any coupons for the  empagliflozin (JARDIANCE) 10 MG TABS tablet  please call her tomorrow at  [phone], the cost without it is 60.00

## 2018-10-15 NOTE — Progress Notes (Signed)
Theresa Gibson is a 62 y.o. female with the following history as recorded in EpicCare:  Patient Active Problem List   Diagnosis Date Noted  . Chronic pain of both knees 07/28/2018  . Diabetes mellitus without complication ([place]) 50/93/2671  . Chronic sinusitis 03/29/2017  . Situational anxiety 11/19/2016  . Myocardial infarction ([place]) 11/19/2016  . Vitamin D deficiency 09/13/2015  . Generalized headaches 04/21/2015  . Osteoarthritis 04/21/2015  . Decreased libido 09/21/2014  . CARPAL TUNNEL SYNDROME, BILATERAL 12/03/2007  . Hypersomnia with sleep apnea 06/24/2007  . CAVERNOUS HEMANGIOMA 04/18/2007  . Hyperlipidemia with target LDL less than 70 12/10/2006  . Obesity 12/10/2006  . Depression 12/10/2006  . Essential hypertension 12/10/2006  . ALLERGIC RHINITIS 12/10/2006  . GERD 12/10/2006    Current Outpatient Medications  Medication Sig Dispense Refill  . acetaminophen (TYLENOL) 500 MG tablet Take 2 tablets (1,000 mg total) by mouth every 8 (eight) hours as needed for moderate pain. 30 tablet 0  . aspirin EC 81 MG tablet Take 81 mg by mouth daily.    Marland Kitchen atorvastatin (LIPITOR) 40 MG tablet Take 40 mg by mouth daily at 6 PM.     . blood glucose meter kit and supplies KIT Dispense based on patient and insurance preference. Use up to four times daily as directed. DX CODE: E11.9 1 each 0  . blood glucose meter kit and supplies KIT Dispense based on patient and insurance preference. Use up to four times daily as directed. Dx Code: E11.9 1 each 0  . carvedilol (COREG) 3.125 MG tablet Take 3.125 mg by mouth 2 (two) times daily with a meal.    . cetirizine (ZYRTEC) 10 MG tablet Take 10 mg by mouth daily as needed for allergies.    . cholecalciferol (VITAMIN D) 1000 units tablet Take 1,000 Units by mouth daily.    . diclofenac (VOLTAREN) 25 MG EC tablet Take 1 tablet (25 mg total) by mouth 2 (two) times daily. 14 tablet 0  . diclofenac sodium (VOLTAREN) 1 % GEL Apply 4 g topically 4 (four)  times daily. 100 g 1  . fluticasone (FLONASE) 50 MCG/ACT nasal spray Place 2 sprays into both nostrils daily. 16 g 0  . losartan (COZAAR) 50 MG tablet Take 1 tablet (50 mg total) by mouth daily. 90 tablet 3  . metFORMIN (GLUCOPHAGE XR) 500 MG 24 hr tablet Take 2 tablets (1,000 mg total) by mouth 2 (two) times daily. (Patient taking differently: Take 500 mg by mouth 2 (two) times daily. ) 120 tablet 2  . nitroGLYCERIN (NITROSTAT) 0.4 MG SL tablet Place 0.4 mg under the tongue every 5 (five) minutes as needed for chest pain.    . pantoprazole (PROTONIX) 40 MG tablet Take 1 tablet (40 mg total) by mouth daily at 6 (six) AM. 30 tablet 3  . traMADol (ULTRAM) 50 MG tablet Take 1 tablet (50 mg total) by mouth every 6 (six) hours as needed. 15 tablet 0  . triamcinolone (NASACORT) 55 MCG/ACT AERO nasal inhaler Place 2 sprays into the nose daily. (Patient taking differently: Place 1-2 sprays into the nose daily as needed (NASAL CONGESTION). )    . azithromycin (ZITHROMAX) 250 MG tablet Take 2 tablets on first day, then 1 tablet daily until complete 6 tablet 0  . buPROPion (WELLBUTRIN SR) 150 MG 12 hr tablet Take 1 tablet (150 mg total) by mouth 2 (two) times daily. 180 tablet 0  . empagliflozin (JARDIANCE) 10 MG TABS tablet Take 10 mg by mouth  daily. 30 tablet 3  . topiramate (TOPAMAX) 50 MG tablet TAKE 1 TAB BY MOUTH TWICE DAILY (Patient not taking: Reported on 10/15/2018) 180 tablet 0  . venlafaxine XR (EFFEXOR XR) 75 MG 24 hr capsule Take 1 capsule (75 mg total) by mouth daily with breakfast. (Patient not taking: Reported on 10/15/2018) 30 capsule 1   No current facility-administered medications for this visit.     Allergies: Nicotine and Ace inhibitors  Past Medical History:  Diagnosis Date  . Acute MI, inferior wall ([place]) 08/2016   s/p DES x 2 to the RCA  . Allergy   . Arthritis   . Depression   . GERD (gastroesophageal reflux disease)   . Hypertension   . Meningioma ([place])   . PONV  (postoperative nausea and vomiting)    nausea only  . Wears dentures    upper    Past Surgical History:  Procedure Laterality Date  . ABDOMINAL HYSTERECTOMY     Total   . BREAST EXCISIONAL BIOPSY Left    approx 30+ years ago  . BREAST SURGERY     BX right breast  . COLONOSCOPY W/ BIOPSIES AND POLYPECTOMY    . CORONARY ANGIOPLASTY WITH STENT PLACEMENT    . KNEE SURGERY Bilateral    arthroscopy  . LAPAROSCOPIC APPENDECTOMY N/A 12/10/2015   Procedure: APPENDECTOMY LAPAROSCOPIC;  Surgeon: Erroll Luna, MD;  Location: [facility];  Service: General;  Laterality: N/A;  . MULTIPLE TOOTH EXTRACTIONS    . NASAL/SINUS ENDOSCOPY Bilateral 03/29/2017   ENDOSCOPIC SINUS SURGERY WITH NAVIGATION (Bilateral)  . SINUS ENDO W/FUSION Bilateral 03/29/2017   Procedure: ENDOSCOPIC SINUS SURGERY WITH NAVIGATION;  Surgeon: Melissa Montane, MD;  Location: [facility];  Service: ENT;  Laterality: Bilateral;  . TUBAL LIGATION    . TURBINATE REDUCTION Bilateral 03/29/2017   Procedure: TURBINATE REDUCTION;  Surgeon: Melissa Montane, MD;  Location: [facility];  Service: ENT;  Laterality: Bilateral;    Family History  Problem Relation Age of Onset  . Hypertension Mother   . Arthritis Mother   . Heart attack Father        In his 57's  . Ovarian cancer Maternal Grandmother   . Colon cancer Neg Hx   . Colon polyps Neg Hx   . Rectal cancer Neg Hx   . Stomach cancer Neg Hx   . Breast cancer Neg Hx     Social History   Tobacco Use  . Smoking status: Former Smoker    Packs/day: 0.50    Years: 42.00    Pack years: 21.00    Types: Cigarettes    Last attempt to quit: 01/03/2018    Years since quitting: 0.8  . Smokeless tobacco: Never Used  Substance Use Topics  . Alcohol use: Yes    Alcohol/week: 0.0 standard drinks    Comment: occ     Subjective:  Theresa Gibson is here today for f/u of anxiety and depression, insomnia, tinnitus- discussed at last OV on 09/01/18- she was switched from wellbutrin to effexor to see if we could get  better control of mood, insomnia and see if the wellbutrin was in fact causing her tinnitus. She was also instructed to increase her metformin to 1000 BID at 09/01/18 OV due to elevated A1c on 8/26 labs. She is back today for follow up, tinnitus continues despite medication adjustments. She also tells me that she stopped the effexor and was only able to increase metformin for about 1 week because she was nauseated with med changes,  nausea improved after stopping the effexor and reducing metformin back to 500 BID. She has continued the metformin 500 BID, Does occasionally check sugars at home - 120-170s- recently, says she has been trying to cut sweets and eat better since she couldn't take more of the metformin. She continues to feel anxious and depressed most days, this has been worse for her recently since shes been out of work due to knee surgery, feels like she wants to go back on wellbutrin because she felt better on the wellbutrin, does not want to go to counseling, does not think she can afford this right now. She is also requesting abx for recent sinus symptoms, about 2-3 day history of sinus pain and pressure, nasal congestion, postnasal drip. Has not tried anything at home for her symptoms, reports hx sinus infections and sinus surgery in the past.  Lab Results  Component Value Date   HGBA1C 7.7 (H) 07/28/2018    ROS- See HPI  Objective:  Vitals:   10/15/18 1023  BP: 140/82  Pulse: 81  SpO2: 99%  Weight: 245 lb (111.1 kg)  Height: '5\' 4"'  (1.626 m)    General: Well developed, well nourished, in no acute distress  Skin : Warm and dry.  Head: Normocephalic and atraumatic  Eyes: Sclera and conjunctiva clear; pupils round and reactive to light; extraocular movements intact  Ears: External normal; canals clear; tympanic membranes normal  Nose: fontal and maxillary tenderness noted bilaterally Oropharynx: Pink, supple. No suspicious lesions  Neck: Supple without thyromegaly, adenopathy   Lungs: Respirations unlabored; clear to auscultation bilaterally without wheeze, rales, rhonchi  CVS exam: normal rate, regular rhythm, normal S1, S2, no murmurs, rubs, clicks or gallops.  Extremities: No edema, cyanosis Vessels: Symmetric bilaterally  Neurologic: Alert and oriented; speech intact; face symmetrical; moves all extremities well; CNII-XII intact without focal deficit  Psychiatric: Normal mood and affect.   Assessment:  1. Anxiety and depression   2. Diabetes mellitus without complication ([place])   3. Tinnitus of both ears   4. Sinus pressure     Plan:   Return in about 1 month (around 11/14/2018) for F/U- Anx, dep, insomnia-resuming wellbutrin; DM-staring jardiance- check glucose logs.  Orders Placed This Encounter  Procedures  . Ambulatory referral to ENT    Referral Priority:   Routine    Referral Type:   Consultation    Referral Reason:   Specialty Services Required    Requested Specialty:   Otolaryngology    Number of Visits Requested:   1    Requested Prescriptions   Signed Prescriptions Disp Refills  . empagliflozin (JARDIANCE) 10 MG TABS tablet 30 tablet 3    Sig: Take 10 mg by mouth daily.  Marland Kitchen buPROPion (WELLBUTRIN SR) 150 MG 12 hr tablet 180 tablet 0    Sig: Take 1 tablet (150 mg total) by mouth 2 (two) times daily.    Tinnitus of both ears Recommend referral to ENT for further evaluation, she is agreeable - Ambulatory referral to ENT  Sinus pressure Symptoms x 2-3 days, we discussed Home management-flonase, zyrtec, red flags and return precautions including when to seek immediate care discussed and printed on AVS

## 2018-10-15 NOTE — Patient Instructions (Addendum)
Try flonase and zyrtec for sinus symptoms as discussed. Please follow up for fevers over 101, if your symptoms get worse, or if your symptoms dont get better with the flonase, zyrtec.  Continue metformin 500 twice daily and ADD jardiance 10mg  daily  Resume wellbutrin 150 mg once daily for one week then increase to twice daily.

## 2018-10-16 ENCOUNTER — Telehealth: Payer: Self-pay | Admitting: Nurse Practitioner

## 2018-10-16 MED ORDER — AZITHROMYCIN 250 MG PO TABS: ORAL_TABLET | ORAL | 0 refills | Status: DC

## 2018-10-16 NOTE — Telephone Encounter (Signed)
Copied from [place] [id number]. Topic: Quick Communication - Rx Refill/Question >> Oct 16, 2018  9:35 AM Scherrie Gerlach wrote: Medication: abx for sinus infection  Pt seen yesterday and had a sinus issue going on.  Pt states she has tried what Ashleigh recommended, but it has not helped. Pt states she got worse in the night, sore throat, feeling sick all over, nasal pressure,yellow drainage cough, low grade 99.9 fever. Pt instructed to call back if worse and she could get a med called in.  [place], [place]. [phone] (Phone) [phone] (Fax)

## 2018-10-16 NOTE — Telephone Encounter (Signed)
I would recommend to continue over the counter treatments as discussed yesterday She can also take tylenol or ibuprofen OTC for pain or discomfort, fevers I have sent a prescription for an antibiotic course, azithromycin, to her pharmacy, however this should only be started if her symptoms persist for > 1 week, she can start this if she is still having symptoms on this coming Sunday or Monday. The antibiotic will not likely help if her symptoms have not been going on for at least 1 week

## 2018-10-16 NOTE — Telephone Encounter (Signed)
I will look

## 2018-10-16 NOTE — Telephone Encounter (Signed)
Called pt again- straight to vm. Unable to leave message. Will try again later.

## 2018-10-16 NOTE — Telephone Encounter (Signed)
Left mess for patient to call back. CRM created.  

## 2018-10-17 DIAGNOSIS — M170 Bilateral primary osteoarthritis of knee: Secondary | ICD-10-CM | POA: Diagnosis not present

## 2018-10-28 ENCOUNTER — Emergency Department (HOSPITAL_BASED_OUTPATIENT_CLINIC_OR_DEPARTMENT_OTHER): Payer: BLUE CROSS/BLUE SHIELD

## 2018-10-28 ENCOUNTER — Other Ambulatory Visit: Payer: Self-pay

## 2018-10-28 ENCOUNTER — Encounter (HOSPITAL_BASED_OUTPATIENT_CLINIC_OR_DEPARTMENT_OTHER): Payer: Self-pay | Admitting: Emergency Medicine

## 2018-10-28 ENCOUNTER — Emergency Department (HOSPITAL_BASED_OUTPATIENT_CLINIC_OR_DEPARTMENT_OTHER)
Admission: EM | Admit: 2018-10-28 | Discharge: 2018-10-28 | Disposition: A | Discharge status: Home / Self Care | Payer: BLUE CROSS/BLUE SHIELD | Attending: Emergency Medicine | Admitting: Emergency Medicine

## 2018-10-28 DIAGNOSIS — Z79899 Other long term (current) drug therapy: Secondary | ICD-10-CM | POA: Diagnosis not present

## 2018-10-28 DIAGNOSIS — R079 Chest pain, unspecified: Secondary | ICD-10-CM | POA: Diagnosis not present

## 2018-10-28 DIAGNOSIS — J069 Acute upper respiratory infection, unspecified: Secondary | ICD-10-CM

## 2018-10-28 DIAGNOSIS — B9789 Other viral agents as the cause of diseases classified elsewhere: Secondary | ICD-10-CM | POA: Diagnosis not present

## 2018-10-28 DIAGNOSIS — Z87891 Personal history of nicotine dependence: Secondary | ICD-10-CM | POA: Diagnosis not present

## 2018-10-28 DIAGNOSIS — Z7901 Long term (current) use of anticoagulants: Secondary | ICD-10-CM | POA: Diagnosis not present

## 2018-10-28 DIAGNOSIS — I10 Essential (primary) hypertension: Secondary | ICD-10-CM | POA: Diagnosis not present

## 2018-10-28 DIAGNOSIS — I252 Old myocardial infarction: Secondary | ICD-10-CM | POA: Diagnosis not present

## 2018-10-28 DIAGNOSIS — E119 Type 2 diabetes mellitus without complications: Secondary | ICD-10-CM | POA: Insufficient documentation

## 2018-10-28 DIAGNOSIS — Z7984 Long term (current) use of oral hypoglycemic drugs: Secondary | ICD-10-CM | POA: Insufficient documentation

## 2018-10-28 DIAGNOSIS — R05 Cough: Secondary | ICD-10-CM | POA: Diagnosis not present

## 2018-10-28 LAB — TROPONIN I: Troponin I: <0.03 ng/mL (ref <0.03)

## 2018-10-28 NOTE — ED Notes (Signed)
ED Provider at bedside. 

## 2018-10-28 NOTE — ED Triage Notes (Signed)
Cough x 2 weeks, completed a zpack but cough persists.

## 2018-10-28 NOTE — Discharge Instructions (Addendum)
Your cough is likely related to a viral upper respiratory infection which has resolved.  Often times nonproductive cough can persist for weeks.  I would anticipate your cough resolving over the next 1-2 weeks.  You can use a tablespoon of honey as needed for your cough.  If your cough persists beyond 2 weeks, please follow-up with your primary care physician.

## 2018-10-28 NOTE — ED Provider Notes (Signed)
[facility] EMERGENCY DEPARTMENT Provider Note   CSN: [id number] Arrival date & time: 10/28/18  1530     History   Chief Complaint Chief Complaint  Patient presents with  . Cough    HPI Theresa Gibson is a 62 y.o. female.  Patient is a 62 year old female presenting with nonproductive cough for 2 weeks.  PMH significant for CAD with history of inferior STEMI s/p x2DES and recently d/c's brilinta, HTN, NIDDM, GERD, allergies, obesity.  Patient was seen by her PCP 2 weeks ago and requested antibiotics for 2-3 days of sinus pain and pressure with nasal congestion and postnasal drip.  Patient did complete a 5-day course of azithromycin.  She does have a history of sinus surgery and recurrent sinus infections.  Her symptoms have resolved with exception to a nonproductive cough.  Patient also reports a sharp chest pain which is triggered by her cough and is primarily under her left breast.  She states that this feels different than her STEMI she had back in 2017.  She has taken an aspirin at home but did not take her nitroglycerin.  This pain is not worse with exertion or improved with rest.  She is a former smoker.     Past Medical History:  Diagnosis Date  . Acute MI, inferior wall ([place]) 08/2016   s/p DES x 2 to the RCA  . Allergy   . Arthritis   . Depression   . GERD (gastroesophageal reflux disease)   . Hypertension   . Meningioma ([place])   . PONV (postoperative nausea and vomiting)    nausea only  . Wears dentures    upper    Patient Active Problem List   Diagnosis Date Noted  . Chronic pain of both knees 07/28/2018  . Diabetes mellitus without complication ([place]) 14/09/3012  . Chronic sinusitis 03/29/2017  . Situational anxiety 11/19/2016  . Myocardial infarction ([place]) 11/19/2016  . Vitamin D deficiency 09/13/2015  . Generalized headaches 04/21/2015  . Osteoarthritis 04/21/2015  . Decreased libido 09/21/2014  . CARPAL TUNNEL SYNDROME, BILATERAL 12/03/2007    . Hypersomnia with sleep apnea 06/24/2007  . CAVERNOUS HEMANGIOMA 04/18/2007  . Hyperlipidemia with target LDL less than 70 12/10/2006  . Obesity 12/10/2006  . Depression 12/10/2006  . Essential hypertension 12/10/2006  . ALLERGIC RHINITIS 12/10/2006  . GERD 12/10/2006    Past Surgical History:  Procedure Laterality Date  . ABDOMINAL HYSTERECTOMY     Total   . BREAST EXCISIONAL BIOPSY Left    approx 30+ years ago  . BREAST SURGERY     BX right breast  . COLONOSCOPY W/ BIOPSIES AND POLYPECTOMY    . CORONARY ANGIOPLASTY WITH STENT PLACEMENT    . KNEE SURGERY Bilateral    arthroscopy  . LAPAROSCOPIC APPENDECTOMY N/A 12/10/2015   Procedure: APPENDECTOMY LAPAROSCOPIC;  Surgeon: Erroll Luna, MD;  Location: [facility];  Service: General;  Laterality: N/A;  . MULTIPLE TOOTH EXTRACTIONS    . NASAL/SINUS ENDOSCOPY Bilateral 03/29/2017   ENDOSCOPIC SINUS SURGERY WITH NAVIGATION (Bilateral)  . SINUS ENDO W/FUSION Bilateral 03/29/2017   Procedure: ENDOSCOPIC SINUS SURGERY WITH NAVIGATION;  Surgeon: Melissa Montane, MD;  Location: [facility];  Service: ENT;  Laterality: Bilateral;  . TUBAL LIGATION    . TURBINATE REDUCTION Bilateral 03/29/2017   Procedure: TURBINATE REDUCTION;  Surgeon: Melissa Montane, MD;  Location: [facility];  Service: ENT;  Laterality: Bilateral;     OB History   None      Home Medications  Prior to Admission medications   Medication Sig Start Date End Date Taking? Authorizing Provider  aspirin EC 81 MG tablet Take 81 mg by mouth daily.   Yes [provider]  atorvastatin (LIPITOR) 40 MG tablet Take 40 mg by mouth daily at 6 PM.    Yes [provider]  buPROPion (WELLBUTRIN SR) 150 MG 12 hr tablet Take 1 tablet (150 mg total) by mouth 2 (two) times daily. 10/15/18  Yes Lance Sell, NP  carvedilol (COREG) 3.125 MG tablet Take 3.125 mg by mouth 2 (two) times daily with a meal.   Yes [provider]  cholecalciferol (VITAMIN D) 1000 units tablet Take  1,000 Units by mouth daily.   Yes [provider]  empagliflozin (JARDIANCE) 10 MG TABS tablet Take 10 mg by mouth daily. 10/15/18  Yes Lance Sell, NP  losartan (COZAAR) 50 MG tablet Take 1 tablet (50 mg total) by mouth daily. 07/28/18  Yes Lance Sell, NP  acetaminophen (TYLENOL) 500 MG tablet Take 2 tablets (1,000 mg total) by mouth every 8 (eight) hours as needed for moderate pain. 09/30/17   Nche, Charlene Brooke, NP  azithromycin (ZITHROMAX) 250 MG tablet Take 2 tablets on first day, then 1 tablet daily until complete 10/16/18   Lance Sell, NP  blood glucose meter kit and supplies KIT Dispense based on patient and insurance preference. Use up to four times daily as directed. DX CODE: E11.9 01/28/18   Lance Sell, NP  blood glucose meter kit and supplies KIT Dispense based on patient and insurance preference. Use up to four times daily as directed. Dx Code: E11.9 02/26/18   Lance Sell, NP  cetirizine (ZYRTEC) 10 MG tablet Take 10 mg by mouth daily as needed for allergies.    [provider]  diclofenac (VOLTAREN) 25 MG EC tablet Take 1 tablet (25 mg total) by mouth 2 (two) times daily. 07/29/18   Quintella Reichert, MD  diclofenac sodium (VOLTAREN) 1 % GEL Apply 4 g topically 4 (four) times daily. 07/28/18   Lance Sell, NP  fluticasone (FLONASE) 50 MCG/ACT nasal spray Place 2 sprays into both nostrils daily. 12/20/17   Palumbo, April, MD  metFORMIN (GLUCOPHAGE XR) 500 MG 24 hr tablet Take 2 tablets (1,000 mg total) by mouth 2 (two) times daily. Patient taking differently: Take 500 mg by mouth 2 (two) times daily.  09/01/18   Lance Sell, NP  nitroGLYCERIN (NITROSTAT) 0.4 MG SL tablet Place 0.4 mg under the tongue every 5 (five) minutes as needed for chest pain.    [provider]  pantoprazole (PROTONIX) 40 MG tablet Take 1 tablet (40 mg total) by mouth daily at 6 (six) AM. 03/02/17   Bhagat, Bhavinkumar, PA    topiramate (TOPAMAX) 50 MG tablet TAKE 1 TAB BY MOUTH TWICE DAILY Patient not taking: Reported on 10/15/2018 08/22/17   Golden Circle, FNP  traMADol (ULTRAM) 50 MG tablet Take 1 tablet (50 mg total) by mouth every 6 (six) hours as needed. 06/26/18   Ashley Murrain, NP  triamcinolone (NASACORT) 55 MCG/ACT AERO nasal inhaler Place 2 sprays into the nose daily. Patient taking differently: Place 1-2 sprays into the nose daily as needed (NASAL CONGESTION).  01/04/17   Molpus, Jenny Reichmann, MD  venlafaxine XR (EFFEXOR XR) 75 MG 24 hr capsule Take 1 capsule (75 mg total) by mouth daily with breakfast. Patient not taking: Reported on 10/15/2018 09/01/18   Lance Sell, NP  Family History Family History  Problem Relation Age of Onset  . Hypertension Mother   . Arthritis Mother   . Heart attack Father        In his 77's  . Ovarian cancer Maternal Grandmother   . Colon cancer Neg Hx   . Colon polyps Neg Hx   . Rectal cancer Neg Hx   . Stomach cancer Neg Hx   . Breast cancer Neg Hx     Social History Social History   Tobacco Use  . Smoking status: Former Smoker    Packs/day: 0.50    Years: 42.00    Pack years: 21.00    Types: Cigarettes    Last attempt to quit: 01/03/2018    Years since quitting: 0.8  . Smokeless tobacco: Never Used  Substance Use Topics  . Alcohol use: Yes    Alcohol/week: 0.0 standard drinks    Comment: occ  . Drug use: No     Allergies   Nicotine and Ace inhibitors   Review of Systems Review of Systems  Constitutional: Negative for chills, diaphoresis, fatigue and fever.  HENT: Positive for congestion, postnasal drip, rhinorrhea and sore throat. Negative for ear pain and sneezing.   Eyes: Negative for pain and visual disturbance.  Respiratory: Positive for cough. Negative for shortness of breath and wheezing.   Cardiovascular: Positive for chest pain and leg swelling. Negative for palpitations.  Gastrointestinal: Negative for abdominal pain, nausea and  vomiting.  Genitourinary: Negative for dysuria and frequency.  Musculoskeletal: Negative for arthralgias and myalgias.  Skin: Negative for color change and rash.  Neurological: Positive for headaches. Negative for dizziness and light-headedness.  All other systems reviewed and are negative.    Physical Exam Updated Vital Signs BP 130/82 (BP Location: Left Arm)   Pulse 73   Temp 98.2 F (36.8 C) (Oral)   Resp 18   Ht '5\' 4"'  (1.626 m)   Wt 108.9 kg   SpO2 99%   BMI 41.20 kg/m   Physical Exam  Constitutional: She appears well-developed and well-nourished. No distress.  HENT:  Head: Normocephalic and atraumatic.  Eyes: Pupils are equal, round, and reactive to light. Conjunctivae are normal.  Neck: Normal range of motion. Neck supple.  Cardiovascular: Normal rate and regular rhythm.  No murmur heard. Pulmonary/Chest: Effort normal and breath sounds normal. No respiratory distress.  Non-productive cough present  Abdominal: Soft. There is no tenderness.  Musculoskeletal: She exhibits no edema.  Neurological: She is alert.  Skin: Skin is warm and dry. She is not diaphoretic.  Psychiatric: She has a normal mood and affect.  Nursing note and vitals reviewed.    ED Treatments / Results  Labs (all labs ordered are listed, but only abnormal results are displayed) Labs Reviewed  TROPONIN I    EKG EKG Interpretation  Date/Time:  Tuesday October 28 2018 16:30:47 EST Ventricular Rate:  72 PR Interval:    QRS Duration: 87 QT Interval:  415 QTC Calculation: 455 R Axis:   12 Text Interpretation:  Sinus rhythm No STEMI.  Confirmed by Nanda Quinton [phone]) on 10/28/2018 4:55:51 PM   Radiology Dg Chest 2 View  Result Date: 10/28/2018 CLINICAL DATA:  Cough for 2 weeks with chest pain EXAM: CHEST - 2 VIEW COMPARISON:  08/24/2016 FINDINGS: The heart size and mediastinal contours are within normal limits. Both lungs are clear with the exception of minimal scarring in the left mid  lung. The visualized skeletal structures are unremarkable. IMPRESSION: No active cardiopulmonary disease.  Electronically Signed   By: Inez Catalina M.D.   On: 10/28/2018 15:49    Procedures Procedures (including critical care time)  Medications Ordered in ED Medications - No data to display   Initial Impression / Assessment and Plan / ED Course  I have reviewed the triage vital signs and the nursing notes.  Pertinent labs & imaging results that were available during my care of the patient were reviewed by me and considered in my medical decision making (see chart for details).  Patient is a 62 year old female presenting with 2 weeks of nonproductive cough following viral URI symptoms and sinus pressure which have improved following a course of azithromycin.  She is here today because her nonproductive cough persists.  Vital signs stable and afebrile on arrival.  Patient without increased work of breathing.  Lungs clear to auscultation.  On exam, patient does have an occasional nonproductive cough approximately 1 time during evaluation.  CXR negative for acute cardiopulmonary processes.  Patient also reports some sharp chest pain with cough.  She has a history of STEMI back in 2017, however this chest pain is very different.  She has no associated shortness of breath did report bilateral increased leg swelling which has resolved.  Well score 0 making PE unlikely.  Will evaluate with EKG and troponin.  EKG NSR 72 bpm without ST changes or T wave abnormalities, QTc 455 without blocks..  Troponin negative x1.  HEART score 3. Low admission for ACS.  Suspect nonproductive cough to viral bronchitis and history of pleuritic chest pain.  Discussed conservative treatment.  Reviewed return precautions.  Patient stable for discharge.  Final Clinical Impressions(s) / ED Diagnoses   Final diagnoses:  Viral URI with cough    ED Discharge Orders    None       Towanda Bing, DO 10/28/18 1717      Margette Fast, MD 10/28/18 [phone]

## 2018-10-29 ENCOUNTER — Encounter: Payer: Self-pay | Admitting: Nurse Practitioner

## 2018-10-29 NOTE — Assessment & Plan Note (Addendum)
Restart wellbutrin-dosing, side effects discussed  Declines counseling Home management, red flags and return precautions including when to seek immediate care discussed and printed on AVS RTC in 1 month for F/U - buPROPion (WELLBUTRIN SR) 150 MG 12 hr tablet; Take 1 tablet (150 mg total) by mouth 2 (two) times daily.  Dispense: 180 tablet; Refill: 0

## 2018-10-29 NOTE — Assessment & Plan Note (Signed)
Will continue metformin 500 BID and try adding jardiance- dosing, side effects discussed RTC in 1 month for F/U-check glucose logs - empagliflozin (JARDIANCE) 10 MG TABS tablet; Take 10 mg by mouth daily.  Dispense: 30 tablet; Refill: 3

## 2018-11-06 DIAGNOSIS — H9311 Tinnitus, right ear: Secondary | ICD-10-CM | POA: Diagnosis not present

## 2018-11-06 DIAGNOSIS — H903 Sensorineural hearing loss, bilateral: Secondary | ICD-10-CM | POA: Insufficient documentation

## 2018-11-06 DIAGNOSIS — K219 Gastro-esophageal reflux disease without esophagitis: Secondary | ICD-10-CM | POA: Insufficient documentation

## 2018-11-06 DIAGNOSIS — H9313 Tinnitus, bilateral: Secondary | ICD-10-CM | POA: Diagnosis not present

## 2018-11-06 DIAGNOSIS — J324 Chronic pansinusitis: Secondary | ICD-10-CM | POA: Diagnosis not present

## 2018-11-06 DIAGNOSIS — R059 Cough, unspecified: Secondary | ICD-10-CM | POA: Insufficient documentation

## 2018-11-06 DIAGNOSIS — R05 Cough: Secondary | ICD-10-CM | POA: Diagnosis not present

## 2018-11-14 ENCOUNTER — Ambulatory Visit: Payer: BLUE CROSS/BLUE SHIELD | Admitting: Nurse Practitioner

## 2018-11-21 ENCOUNTER — Ambulatory Visit: Payer: BLUE CROSS/BLUE SHIELD | Admitting: Nurse Practitioner

## 2018-11-21 DIAGNOSIS — Z0289 Encounter for other administrative examinations: Secondary | ICD-10-CM

## 2018-11-29 ENCOUNTER — Emergency Department (HOSPITAL_BASED_OUTPATIENT_CLINIC_OR_DEPARTMENT_OTHER): Payer: BLUE CROSS/BLUE SHIELD

## 2018-11-29 ENCOUNTER — Encounter (HOSPITAL_BASED_OUTPATIENT_CLINIC_OR_DEPARTMENT_OTHER): Payer: Self-pay | Admitting: Emergency Medicine

## 2018-11-29 ENCOUNTER — Emergency Department (HOSPITAL_BASED_OUTPATIENT_CLINIC_OR_DEPARTMENT_OTHER)
Admission: EM | Admit: 2018-11-29 | Discharge: 2018-11-29 | Disposition: A | Discharge status: Home / Self Care | Payer: BLUE CROSS/BLUE SHIELD | Attending: Emergency Medicine | Admitting: Emergency Medicine

## 2018-11-29 ENCOUNTER — Other Ambulatory Visit: Payer: Self-pay

## 2018-11-29 DIAGNOSIS — Z87891 Personal history of nicotine dependence: Secondary | ICD-10-CM | POA: Diagnosis not present

## 2018-11-29 DIAGNOSIS — Z79899 Other long term (current) drug therapy: Secondary | ICD-10-CM | POA: Diagnosis not present

## 2018-11-29 DIAGNOSIS — Z955 Presence of coronary angioplasty implant and graft: Secondary | ICD-10-CM | POA: Diagnosis not present

## 2018-11-29 DIAGNOSIS — F329 Major depressive disorder, single episode, unspecified: Secondary | ICD-10-CM | POA: Diagnosis not present

## 2018-11-29 DIAGNOSIS — E119 Type 2 diabetes mellitus without complications: Secondary | ICD-10-CM | POA: Insufficient documentation

## 2018-11-29 DIAGNOSIS — I10 Essential (primary) hypertension: Secondary | ICD-10-CM | POA: Insufficient documentation

## 2018-11-29 DIAGNOSIS — F419 Anxiety disorder, unspecified: Secondary | ICD-10-CM | POA: Diagnosis not present

## 2018-11-29 DIAGNOSIS — Z7982 Long term (current) use of aspirin: Secondary | ICD-10-CM | POA: Insufficient documentation

## 2018-11-29 DIAGNOSIS — Z7984 Long term (current) use of oral hypoglycemic drugs: Secondary | ICD-10-CM | POA: Diagnosis not present

## 2018-11-29 DIAGNOSIS — R0789 Other chest pain: Secondary | ICD-10-CM | POA: Diagnosis not present

## 2018-11-29 DIAGNOSIS — R079 Chest pain, unspecified: Secondary | ICD-10-CM | POA: Diagnosis not present

## 2018-11-29 DIAGNOSIS — I252 Old myocardial infarction: Secondary | ICD-10-CM | POA: Diagnosis not present

## 2018-11-29 LAB — BASIC METABOLIC PANEL
Anion gap: 9 (ref 5–15)
BUN: 14 mg/dL (ref 6–20)
CALCIUM: 9.8 mg/dL (ref 8.9–10.3)
CO2: 25 mmol/L (ref 22–32)
CREATININE: 0.72 mg/dL (ref 0.44–1.00)
Chloride: 102 mmol/L (ref 98–111)
GFR calc Af Amer: >60 mL/min (ref >60)
Glucose, Bld: 97 mg/dL (ref 70–99)
Potassium: 4.3 mmol/L (ref 3.5–5.1)
Sodium: 136 mmol/L (ref 135–145)

## 2018-11-29 LAB — TROPONIN I: Troponin I: <0.03 ng/mL (ref <0.03)

## 2018-11-29 LAB — CBC
HCT: 46.4 % — ABNORMAL HIGH (ref 36.0–46.0)
Hemoglobin: 14.4 g/dL (ref 12.0–15.0)
MCH: 27 pg (ref 26.0–34.0)
MCHC: 31 g/dL (ref 30.0–36.0)
MCV: 86.9 fL (ref 80.0–100.0)
PLATELETS: 281 10*3/uL (ref 150–400)
RBC: 5.34 MIL/uL — ABNORMAL HIGH (ref 3.87–5.11)
RDW: 14.6 % (ref 11.5–15.5)
WBC: 10.6 10*3/uL — ABNORMAL HIGH (ref 4.0–10.5)
nRBC: 0 % (ref 0.0–0.2)

## 2018-11-29 LAB — D-DIMER, QUANTITATIVE: D-Dimer, Quant: 0.48 ug/mL-FEU (ref 0.00–0.50)

## 2018-11-29 LAB — HEPATIC FUNCTION PANEL
ALT: 20 U/L (ref 0–44)
AST: 19 U/L (ref 15–41)
Albumin: 4 g/dL (ref 3.5–5.0)
Alkaline Phosphatase: 98 U/L (ref 38–126)
Bilirubin, Direct: 0.1 mg/dL (ref 0.0–0.2)
Indirect Bilirubin: 0.4 mg/dL (ref 0.3–0.9)
Total Bilirubin: 0.5 mg/dL (ref 0.3–1.2)
Total Protein: 7.4 g/dL (ref 6.5–8.1)

## 2018-11-29 LAB — LIPASE, BLOOD: Lipase: 29 U/L (ref 11–51)

## 2018-11-29 MED ORDER — NITROGLYCERIN 0.4 MG SL SUBL
0.4000 mg | SUBLINGUAL_TABLET | SUBLINGUAL | Status: DC | PRN
  Administered 2018-11-29: 0.4 mg via SUBLINGUAL
  Filled 2018-11-29: qty 1

## 2018-11-29 MED ORDER — ALUM & MAG HYDROXIDE-SIMETH 200-200-20 MG/5ML PO SUSP
30.0000 mL | Freq: Once | ORAL | Status: AC
  Administered 2018-11-29: 30 mL via ORAL
  Filled 2018-11-29: qty 30

## 2018-11-29 MED ORDER — ASPIRIN 81 MG PO CHEW
162.0000 mg | CHEWABLE_TABLET | Freq: Once | ORAL | Status: AC
  Administered 2018-11-29: 162 mg via ORAL
  Filled 2018-11-29: qty 2

## 2018-11-29 MED ORDER — ACETAMINOPHEN 325 MG PO TABS
650.0000 mg | ORAL_TABLET | Freq: Once | ORAL | Status: AC
  Administered 2018-11-29: 650 mg via ORAL
  Filled 2018-11-29: qty 2

## 2018-11-29 MED ORDER — NITROGLYCERIN 0.4 MG SL SUBL: 0.4000 mg | SUBLINGUAL_TABLET | SUBLINGUAL | Status: DC | PRN

## 2018-11-29 MED ORDER — ONDANSETRON HCL 4 MG/2ML IJ SOLN
4.0000 mg | Freq: Once | INTRAMUSCULAR | Status: AC
  Administered 2018-11-29: 4 mg via INTRAVENOUS
  Filled 2018-11-29: qty 2

## 2018-11-29 NOTE — ED Notes (Signed)
Pt reports nitro made her vomit, provider notified. Reports no change in pain, reports she does not want any more nitro

## 2018-11-29 NOTE — ED Notes (Signed)
ED Provider at bedside. 

## 2018-11-29 NOTE — Discharge Instructions (Addendum)
The cause of your pain was not identified today.  Please follow up with your doctor for recheck.  Get rechecked immediately if you have new or concerning symptoms.

## 2018-11-29 NOTE — ED Notes (Signed)
Notified Kim in lab regarding additional tests.

## 2018-11-29 NOTE — ED Notes (Signed)
Patient transported to X-ray 

## 2018-11-29 NOTE — ED Provider Notes (Signed)
[facility]ERGENC[facility] DEPARTMENT Provider Note   CSN: [id number] Arrival date & time: 11/29/18  1916     History   Chief Complaint Chief Complaint  Patient presents with  . Chest Pain    HPI Theresa Gibson is a 62 y.o. female.  The history is provided by the patient and medical records. No language interpreter was used.  Chest Pain     Theresa Gibson is a 62 y.o. female who presents to the Emergency Department complaining of chest pain. She presents to the emergency department complaining of chest pain that started around 10 o'clock this morning. Pain is described as a labor type pain and located in the left chest. Episodes are waxing and waning and last about five minutes to time. There are no clear alleviating or worsening factors. No consistent change with activity or with rest. She denies any fevers, coughing, shortness of breath. She does have occasional intermittent left arm pain as well as nausea. No vomiting, diarrhea. She had transient right sided abdominal pain earlier today, now resolved. She has a history of prior MI two years ago with similar symptoms. Past Medical History:  Diagnosis Date  . Acute MI, inferior wall ([place]) 08/2016   s/p DES x 2 to the RCA  . Allergy   . Arthritis   . Depression   . GERD (gastroesophageal reflux disease)   . Hypertension   . Meningioma ([place])   . PONV (postoperative nausea and vomiting)    nausea only  . Wears dentures    upper    Patient Active Problem List   Diagnosis Date Noted  . Chronic pain of both knees 07/28/2018  . Diabetes mellitus without complication ([place]) 26/37/8588  . Chronic sinusitis 03/29/2017  . Situational anxiety 11/19/2016  . Myocardial infarction ([place]) 11/19/2016  . Vitamin D deficiency 09/13/2015  . Generalized headaches 04/21/2015  . Osteoarthritis 04/21/2015  . Decreased libido 09/21/2014  . CARPAL TUNNEL SYNDROME, BILATERAL 12/03/2007  . Hypersomnia with sleep apnea 06/24/2007  .  CAVERNOUS HEMANGIOMA 04/18/2007  . Hyperlipidemia with target LDL less than 70 12/10/2006  . Obesity 12/10/2006  . Anxiety and depression 12/10/2006  . Essential hypertension 12/10/2006  . ALLERGIC RHINITIS 12/10/2006  . GERD 12/10/2006    Past Surgical History:  Procedure Laterality Date  . ABDOMINAL HYSTERECTOMY     Total   . BREAST EXCISIONAL BIOPSY Left    approx 30+ years ago  . BREAST SURGERY     BX right breast  . COLONOSCOPY W/ BIOPSIES AND POLYPECTOMY    . CORONARY ANGIOPLASTY WITH STENT PLACEMENT    . KNEE SURGERY Bilateral    arthroscopy  . LAPAROSCOPIC APPENDECTOMY N/A 12/10/2015   Procedure: APPENDECTOMY LAPAROSCOPIC;  Surgeon: Erroll Luna, MD;  Location: [facility];  Service: General;  Laterality: N/A;  . MULTIPLE TOOTH EXTRACTIONS    . NASAL/SINUS ENDOSCOPY Bilateral 03/29/2017   ENDOSCOPIC SINUS SURGERY WITH NAVIGATION (Bilateral)  . SINUS ENDO W/FUSION Bilateral 03/29/2017   Procedure: ENDOSCOPIC SINUS SURGERY WITH NAVIGATION;  Surgeon: Melissa Montane, MD;  Location: [facility];  Service: ENT;  Laterality: Bilateral;  . TUBAL LIGATION    . TURBINATE REDUCTION Bilateral 03/29/2017   Procedure: TURBINATE REDUCTION;  Surgeon: Melissa Montane, MD;  Location: [facility];  Service: ENT;  Laterality: Bilateral;     OB History   No obstetric history on file.      Home Medications    Prior to Admission medications   Medication Sig Start Date End Date  Taking? Authorizing Provider  acetaminophen (TYLENOL) 500 MG tablet Take 2 tablets (1,000 mg total) by mouth every 8 (eight) hours as needed for moderate pain. 09/30/17   Nche, Charlene Brooke, NP  aspirin EC 81 MG tablet Take 81 mg by mouth daily.    [provider]  atorvastatin (LIPITOR) 40 MG tablet Take 40 mg by mouth daily at 6 PM.     [provider]  azithromycin (ZITHROMAX) 250 MG tablet Take 2 tablets on first day, then 1 tablet daily until complete 10/16/18   Lance Sell, NP  blood glucose meter kit and  supplies KIT Dispense based on patient and insurance preference. Use up to four times daily as directed. DX CODE: E11.9 01/28/18   Lance Sell, NP  blood glucose meter kit and supplies KIT Dispense based on patient and insurance preference. Use up to four times daily as directed. Dx Code: E11.9 02/26/18   Lance Sell, NP  buPROPion (WELLBUTRIN SR) 150 MG 12 hr tablet Take 1 tablet (150 mg total) by mouth 2 (two) times daily. 10/15/18   Lance Sell, NP  carvedilol (COREG) 3.125 MG tablet Take 3.125 mg by mouth 2 (two) times daily with a meal.    [provider]  cetirizine (ZYRTEC) 10 MG tablet Take 10 mg by mouth daily as needed for allergies.    [provider]  cholecalciferol (VITAMIN D) 1000 units tablet Take 1,000 Units by mouth daily.    [provider]  diclofenac (VOLTAREN) 25 MG EC tablet Take 1 tablet (25 mg total) by mouth 2 (two) times daily. 07/29/18   Quintella Reichert, MD  diclofenac sodium (VOLTAREN) 1 % GEL Apply 4 g topically 4 (four) times daily. 07/28/18   Lance Sell, NP  empagliflozin (JARDIANCE) 10 MG TABS tablet Take 10 mg by mouth daily. 10/15/18   Lance Sell, NP  fluticasone (FLONASE) 50 MCG/ACT nasal spray Place 2 sprays into both nostrils daily. 12/20/17   Palumbo, April, MD  losartan (COZAAR) 50 MG tablet Take 1 tablet (50 mg total) by mouth daily. 07/28/18   Lance Sell, NP  metFORMIN (GLUCOPHAGE XR) 500 MG 24 hr tablet Take 2 tablets (1,000 mg total) by mouth 2 (two) times daily. Patient taking differently: Take 500 mg by mouth 2 (two) times daily.  09/01/18   Lance Sell, NP  nitroGLYCERIN (NITROSTAT) 0.4 MG SL tablet Place 0.4 mg under the tongue every 5 (five) minutes as needed for chest pain.    [provider]  pantoprazole (PROTONIX) 40 MG tablet Take 1 tablet (40 mg total) by mouth daily at 6 (six) AM. 03/02/17   Bhagat, Bhavinkumar, PA  topiramate (TOPAMAX) 50 MG tablet TAKE  1 TAB BY MOUTH TWICE DAILY Patient not taking: Reported on 10/15/2018 08/22/17   Golden Circle, FNP  traMADol (ULTRAM) 50 MG tablet Take 1 tablet (50 mg total) by mouth every 6 (six) hours as needed. 06/26/18   Ashley Murrain, NP  triamcinolone (NASACORT) 55 MCG/ACT AERO nasal inhaler Place 2 sprays into the nose daily. Patient taking differently: Place 1-2 sprays into the nose daily as needed (NASAL CONGESTION).  01/04/17   Molpus, Jenny Reichmann, MD  venlafaxine XR (EFFEXOR XR) 75 MG 24 hr capsule Take 1 capsule (75 mg total) by mouth daily with breakfast. Patient not taking: Reported on 10/15/2018 09/01/18   Lance Sell, NP    Family History Family History  Problem Relation Age of Onset  .  Hypertension Mother   . Arthritis Mother   . Heart attack Father        In his 22's  . Ovarian cancer Maternal Grandmother   . Colon cancer Neg Hx   . Colon polyps Neg Hx   . Rectal cancer Neg Hx   . Stomach cancer Neg Hx   . Breast cancer Neg Hx     Social History Social History   Tobacco Use  . Smoking status: Former Smoker    Packs/day: 0.50    Years: 42.00    Pack years: 21.00    Types: Cigarettes    Last attempt to quit: 01/03/2018    Years since quitting: 0.9  . Smokeless tobacco: Never Used  Substance Use Topics  . Alcohol use: Yes    Alcohol/week: 0.0 standard drinks    Frequency: Never    Comment: occ  . Drug use: No     Allergies   Nicotine and Ace inhibitors   Review of Systems Review of Systems  Cardiovascular: Positive for chest pain.  All other systems reviewed and are negative.    Physical Exam Updated Vital Signs BP (!) 111/54   Pulse 76   Temp 98.3 F (36.8 C) (Oral)   Resp 19   Ht '5\' 3"'  (1.6 m)   Wt 108.9 kg   SpO2 98%   BMI 42.51 kg/m   Physical Exam Vitals signs and nursing note reviewed.  Constitutional:      Appearance: She is well-developed.  HENT:     Head: Normocephalic and atraumatic.  Neck:     Musculoskeletal: Neck supple.    Cardiovascular:     Rate and Rhythm: Normal rate and regular rhythm.     Heart sounds: No murmur.  Pulmonary:     Effort: Pulmonary effort is normal. No respiratory distress.     Breath sounds: Normal breath sounds.  Abdominal:     Palpations: Abdomen is soft.     Tenderness: There is no abdominal tenderness. There is no guarding or rebound.  Musculoskeletal:        General: No swelling or tenderness.  Skin:    General: Skin is warm and dry.     Capillary Refill: Capillary refill takes less than 2 seconds.  Neurological:     Mental Status: She is alert and oriented to person, place, and time.  Psychiatric:        Mood and Affect: Mood normal.        Behavior: Behavior normal.      ED Treatments / Results  Labs (all labs ordered are listed, but only abnormal results are displayed) Labs Reviewed  CBC - Abnormal; Notable for the following components:      Result Value   WBC 10.6 (*)    RBC 5.34 (*)    HCT 46.4 (*)    All other components within normal limits  BASIC METABOLIC PANEL  TROPONIN I  LIPASE, BLOOD  HEPATIC FUNCTION PANEL  D-DIMER, QUANTITATIVE (NOT AT [facility])  TROPONIN I    EKG EKG Interpretation  Date/Time:  Saturday November 29 2018 19:48:18 EST Ventricular Rate:  77 PR Interval:    QRS Duration: 75 QT Interval:  391 QTC Calculation: 443 R Axis:   42 Text Interpretation:  Sinus rhythm Low voltage, precordial leads Confirmed by Quintella Reichert [phone]) on 11/29/2018 7:52:01 PM   Radiology Dg Chest 2 View  Result Date: 11/29/2018 CLINICAL DATA:  Worsening chest pain today. EXAM: CHEST - 2 VIEW COMPARISON:  October 28, 2018 or FINDINGS: The heart size and mediastinal contours are within normal limits. There is no focal infiltrate, pulmonary edema, or pleural effusion. There is mild atelectasis of lateral left lung base. The visualized skeletal structures are unremarkable. IMPRESSION: No active cardiopulmonary disease. Electronically Signed   By:  Abelardo Diesel M.D.   On: 11/29/2018 20:17    Procedures Procedures (including critical care time)  Medications Ordered in ED Medications  nitroGLYCERIN (NITROSTAT) SL tablet 0.4 mg (0.4 mg Sublingual Refused 11/29/18 2134)  aspirin chewable tablet 162 mg (162 mg Oral Given 11/29/18 2006)  alum & mag hydroxide-simeth (MAALOX/MYLANTA) 200-200-20 MG/5ML suspension 30 mL (30 mLs Oral Given 11/29/18 2006)  ondansetron (ZOFRAN) injection 4 mg (4 mg Intravenous Given 11/29/18 2150)  acetaminophen (TYLENOL) tablet 650 mg (650 mg Oral Given 11/29/18 2239)     Initial Impression / Assessment and Plan / ED Course  I have reviewed the triage vital signs and the nursing notes.  Pertinent labs & imaging results that were available during my care of the patient were reviewed by me and considered in my medical decision making (see chart for details).     Patient with history of coronary artery disease here for evaluation of waxing and waning chest pain that started at 10 o'clock today. EKG without acute ischemic changes. Troponin is negative times two. Her symptoms are improved after G.I. medications. Current presentation is not consistent with ACS, PE, dissection, cholecystitis.  D/w pt home care, outpatient follow up and return precautions.    HEART score of 3.    Final Clinical Impressions(s) / ED Diagnoses   Final diagnoses:  Atypical chest pain    ED Discharge Orders    None       Quintella Reichert, MD 11/30/18 0040

## 2018-11-29 NOTE — ED Notes (Signed)
Pt understood dc material. NAD noted. 

## 2018-11-29 NOTE — ED Triage Notes (Signed)
L sided CP that started this morning, and has gradually worsened. Describes pain as dull and intermittent. Also reports L arm pain and nausea. Hx of MI with stent placement 2 years ago. States she took 2 81mg  ASA at home.

## 2019-01-17 ENCOUNTER — Emergency Department (HOSPITAL_BASED_OUTPATIENT_CLINIC_OR_DEPARTMENT_OTHER)
Admission: EM | Admit: 2019-01-17 | Discharge: 2019-01-17 | Disposition: A | Discharge status: Home / Self Care | Payer: BLUE CROSS/BLUE SHIELD | Attending: Emergency Medicine | Admitting: Emergency Medicine

## 2019-01-17 ENCOUNTER — Encounter (HOSPITAL_BASED_OUTPATIENT_CLINIC_OR_DEPARTMENT_OTHER): Payer: Self-pay | Admitting: Emergency Medicine

## 2019-01-17 ENCOUNTER — Other Ambulatory Visit: Payer: Self-pay

## 2019-01-17 DIAGNOSIS — Z87891 Personal history of nicotine dependence: Secondary | ICD-10-CM | POA: Insufficient documentation

## 2019-01-17 DIAGNOSIS — Z7984 Long term (current) use of oral hypoglycemic drugs: Secondary | ICD-10-CM | POA: Insufficient documentation

## 2019-01-17 DIAGNOSIS — E119 Type 2 diabetes mellitus without complications: Secondary | ICD-10-CM | POA: Insufficient documentation

## 2019-01-17 DIAGNOSIS — J209 Acute bronchitis, unspecified: Secondary | ICD-10-CM

## 2019-01-17 DIAGNOSIS — I10 Essential (primary) hypertension: Secondary | ICD-10-CM | POA: Insufficient documentation

## 2019-01-17 DIAGNOSIS — Z7982 Long term (current) use of aspirin: Secondary | ICD-10-CM | POA: Insufficient documentation

## 2019-01-17 DIAGNOSIS — Z79899 Other long term (current) drug therapy: Secondary | ICD-10-CM | POA: Insufficient documentation

## 2019-01-17 DIAGNOSIS — I252 Old myocardial infarction: Secondary | ICD-10-CM | POA: Insufficient documentation

## 2019-01-17 MED ORDER — AZITHROMYCIN 250 MG PO TABS: 250.0000 mg | ORAL_TABLET | Freq: Every day | ORAL | 0 refills | Status: DC

## 2019-01-17 MED ORDER — AZITHROMYCIN 250 MG PO TABS
500.0000 mg | ORAL_TABLET | Freq: Once | ORAL | Status: AC
  Administered 2019-01-17: 500 mg via ORAL
  Filled 2019-01-17: qty 2

## 2019-01-17 NOTE — ED Notes (Signed)
ED Provider at bedside. 

## 2019-01-17 NOTE — ED Notes (Signed)
Pt understood dc material. NAD noted. Script given at dc. All questions answered to satisfaction. 

## 2019-01-17 NOTE — ED Provider Notes (Signed)
Theresa Gibson Provider Note   CSN: [id number] Arrival date & time: 01/17/19  0456     History   Chief Complaint Chief Complaint  Patient presents with  . Cough  . Sore Throat    HPI Theresa Gibson is a 63 y.o. female.  Patient is a 63 year old female with past medical history of diabetes, coronary artery disease with stent.  She presents today for evaluation of cough and congestion.  This is been persistent for the past 2 weeks.  It is not improving despite over-the-counter medications.  She denies any chest pain or difficulty breathing.  The history is provided by the patient.  Cough  Cough characteristics:  Productive Sputum characteristics:  Yellow Severity:  Moderate Onset quality:  Gradual Duration:  2 weeks Timing:  Constant Progression:  Unchanged Chronicity:  New Smoker: no   Worsened by:  Nothing Ineffective treatments:  Decongestant Sore Throat     Past Medical History:  Diagnosis Date  . Acute MI, inferior wall ([place]) 08/2016   s/p DES x 2 to the RCA  . Allergy   . Arthritis   . Depression   . GERD (gastroesophageal reflux disease)   . Hypertension   . Meningioma ([place])   . PONV (postoperative nausea and vomiting)    nausea only  . Wears dentures    upper    Patient Active Problem List   Diagnosis Date Noted  . Chronic pain of both knees 07/28/2018  . Diabetes mellitus without complication ([place]) 18/56/3149  . Chronic sinusitis 03/29/2017  . Situational anxiety 11/19/2016  . Myocardial infarction ([place]) 11/19/2016  . Vitamin D deficiency 09/13/2015  . Generalized headaches 04/21/2015  . Osteoarthritis 04/21/2015  . Decreased libido 09/21/2014  . CARPAL TUNNEL SYNDROME, BILATERAL 12/03/2007  . Hypersomnia with sleep apnea 06/24/2007  . CAVERNOUS HEMANGIOMA 04/18/2007  . Hyperlipidemia with target LDL less than 70 12/10/2006  . Obesity 12/10/2006  . Anxiety and depression 12/10/2006  . Essential hypertension  12/10/2006  . ALLERGIC RHINITIS 12/10/2006  . GERD 12/10/2006    Past Surgical History:  Procedure Laterality Date  . ABDOMINAL HYSTERECTOMY     Total   . BREAST EXCISIONAL BIOPSY Left    approx 30+ years ago  . BREAST SURGERY     BX right breast  . COLONOSCOPY W/ BIOPSIES AND POLYPECTOMY    . CORONARY ANGIOPLASTY WITH STENT PLACEMENT    . KNEE SURGERY Bilateral    arthroscopy  . LAPAROSCOPIC APPENDECTOMY N/A 12/10/2015   Procedure: APPENDECTOMY LAPAROSCOPIC;  Surgeon: Erroll Luna, MD;  Location: [facility];  Service: General;  Laterality: N/A;  . MULTIPLE TOOTH EXTRACTIONS    . NASAL/SINUS ENDOSCOPY Bilateral 03/29/2017   ENDOSCOPIC SINUS SURGERY WITH NAVIGATION (Bilateral)  . SINUS ENDO W/FUSION Bilateral 03/29/2017   Procedure: ENDOSCOPIC SINUS SURGERY WITH NAVIGATION;  Surgeon: Melissa Montane, MD;  Location: [facility];  Service: ENT;  Laterality: Bilateral;  . TUBAL LIGATION    . TURBINATE REDUCTION Bilateral 03/29/2017   Procedure: TURBINATE REDUCTION;  Surgeon: Melissa Montane, MD;  Location: [facility];  Service: ENT;  Laterality: Bilateral;     OB History   No obstetric history on file.      Home Medications    Prior to Admission medications   Medication Sig Start Date End Date Taking? Authorizing Provider  acetaminophen (TYLENOL) 500 MG tablet Take 2 tablets (1,000 mg total) by mouth every 8 (eight) hours as needed for moderate pain. 09/30/17   Nche, Charlene Brooke, NP  aspirin EC 81 MG tablet Take 81 mg by mouth daily.    [provider]  atorvastatin (LIPITOR) 40 MG tablet Take 40 mg by mouth daily at 6 PM.     [provider]  azithromycin (ZITHROMAX) 250 MG tablet Take 2 tablets on first day, then 1 tablet daily until complete 10/16/18   Lance Sell, NP  blood glucose meter kit and supplies KIT Dispense based on patient and insurance preference. Use up to four times daily as directed. DX CODE: E11.9 01/28/18   Lance Sell, NP  blood glucose meter kit  and supplies KIT Dispense based on patient and insurance preference. Use up to four times daily as directed. Dx Code: E11.9 02/26/18   Lance Sell, NP  buPROPion (WELLBUTRIN SR) 150 MG 12 hr tablet Take 1 tablet (150 mg total) by mouth 2 (two) times daily. 10/15/18   Lance Sell, NP  carvedilol (COREG) 3.125 MG tablet Take 3.125 mg by mouth 2 (two) times daily with a meal.    [provider]  cetirizine (ZYRTEC) 10 MG tablet Take 10 mg by mouth daily as needed for allergies.    [provider]  cholecalciferol (VITAMIN D) 1000 units tablet Take 1,000 Units by mouth daily.    [provider]  diclofenac (VOLTAREN) 25 MG EC tablet Take 1 tablet (25 mg total) by mouth 2 (two) times daily. 07/29/18   Quintella Reichert, MD  diclofenac sodium (VOLTAREN) 1 % GEL Apply 4 g topically 4 (four) times daily. 07/28/18   Lance Sell, NP  empagliflozin (JARDIANCE) 10 MG TABS tablet Take 10 mg by mouth daily. 10/15/18   Lance Sell, NP  fluticasone (FLONASE) 50 MCG/ACT nasal spray Place 2 sprays into both nostrils daily. 12/20/17   Palumbo, April, MD  losartan (COZAAR) 50 MG tablet Take 1 tablet (50 mg total) by mouth daily. 07/28/18   Lance Sell, NP  metFORMIN (GLUCOPHAGE XR) 500 MG 24 hr tablet Take 2 tablets (1,000 mg total) by mouth 2 (two) times daily. Patient taking differently: Take 500 mg by mouth 2 (two) times daily.  09/01/18   Lance Sell, NP  nitroGLYCERIN (NITROSTAT) 0.4 MG SL tablet Place 0.4 mg under the tongue every 5 (five) minutes as needed for chest pain.    [provider]  pantoprazole (PROTONIX) 40 MG tablet Take 1 tablet (40 mg total) by mouth daily at 6 (six) AM. 03/02/17   Bhagat, Bhavinkumar, PA  topiramate (TOPAMAX) 50 MG tablet TAKE 1 TAB BY MOUTH TWICE DAILY Patient not taking: Reported on 10/15/2018 08/22/17   Golden Circle, FNP  traMADol (ULTRAM) 50 MG tablet Take 1 tablet (50 mg total) by mouth every  6 (six) hours as needed. 06/26/18   Ashley Murrain, NP  triamcinolone (NASACORT) 55 MCG/ACT AERO nasal inhaler Place 2 sprays into the nose daily. Patient taking differently: Place 1-2 sprays into the nose daily as needed (NASAL CONGESTION).  01/04/17   Molpus, Jenny Reichmann, MD  venlafaxine XR (EFFEXOR XR) 75 MG 24 hr capsule Take 1 capsule (75 mg total) by mouth daily with breakfast. Patient not taking: Reported on 10/15/2018 09/01/18   Lance Sell, NP    Family History Family History  Problem Relation Age of Onset  . Hypertension Mother   . Arthritis Mother   . Heart attack Father        In his 60's  . Ovarian cancer Maternal Grandmother   . Colon cancer  Neg Hx   . Colon polyps Neg Hx   . Rectal cancer Neg Hx   . Stomach cancer Neg Hx   . Breast cancer Neg Hx     Social History Social History   Tobacco Use  . Smoking status: Former Smoker    Packs/day: 0.50    Years: 42.00    Pack years: 21.00    Types: Cigarettes    Last attempt to quit: 01/03/2018    Years since quitting: 1.0  . Smokeless tobacco: Never Used  Substance Use Topics  . Alcohol use: Yes    Alcohol/week: 0.0 standard drinks    Frequency: Never    Comment: occ  . Drug use: No     Allergies   Nicotine and Ace inhibitors   Review of Systems Review of Systems  Respiratory: Positive for cough.   All other systems reviewed and are negative.    Physical Exam Updated Vital Signs Pulse 77   Temp 97.9 F (36.6 C)   Resp 20   Ht _0  (1.626 m)   Wt 108.9 kg   SpO2 100%   BMI 41.20 kg/m   Physical Exam Vitals signs and nursing note reviewed.  Constitutional:      General: She is not in acute distress.    Appearance: She is well-developed. She is not diaphoretic.  HENT:     Head: Normocephalic and atraumatic.  Neck:     Musculoskeletal: Normal range of motion and neck supple.  Cardiovascular:     Rate and Rhythm: Normal rate and regular rhythm.     Heart sounds: No murmur. No friction rub. No  gallop.   Pulmonary:     Effort: Pulmonary effort is normal. No respiratory distress.     Breath sounds: Normal breath sounds. No wheezing.  Abdominal:     General: Bowel sounds are normal. There is no distension.     Palpations: Abdomen is soft.     Tenderness: There is no abdominal tenderness.  Musculoskeletal: Normal range of motion.  Skin:    General: Skin is warm and dry.  Neurological:     Mental Status: She is alert and oriented to person, place, and time.      ED Treatments / Results  Labs (all labs ordered are listed, but only abnormal results are displayed) Labs Reviewed - No data to display  EKG None  Radiology No results found.  Procedures Procedures (including critical care time)  Medications Ordered in ED Medications  azithromycin (ZITHROMAX) tablet 500 mg (has no administration in time range)     Initial Impression / Assessment and Plan / ED Course  I have reviewed the triage vital signs and the nursing notes.  Pertinent labs & imaging results that were available during my care of the patient were reviewed by me and considered in my medical decision making (see chart for details).  Patient with persistent URI symptoms for acute bronchitis with Zithromax and will be discharged to home.  Final Clinical Impressions(s) / ED Diagnoses   Final diagnoses:  None    ED Discharge Orders    None       Veryl Speak, MD 01/17/19 [phone]

## 2019-01-17 NOTE — ED Triage Notes (Signed)
Pt states that she has had an URI for two weeks and cannot get rid of it. Coughing up some yellow mucous. Been taking mucinex. States she started running fever 2-3 days ago Nausea but no vomiting

## 2019-01-17 NOTE — Discharge Instructions (Addendum)
Zithromax as prescribed.  Continue over the counter medications as needed for symptom relief.  Follow-up with your primary doctor if symptoms or not improving in the next week.

## 2019-01-17 NOTE — ED Triage Notes (Signed)
States she took 2 Excedrin approx 1 hour ago for her headache

## 2019-02-09 ENCOUNTER — Encounter: Payer: Self-pay | Admitting: Nurse Practitioner

## 2019-02-09 ENCOUNTER — Ambulatory Visit (INDEPENDENT_AMBULATORY_CARE_PROVIDER_SITE_OTHER): Payer: BLUE CROSS/BLUE SHIELD | Admitting: Nurse Practitioner

## 2019-02-09 VITALS — BP 110/72 | HR 86 | Ht 64.0 in | Wt 243.0 lb

## 2019-02-09 DIAGNOSIS — F329 Major depressive disorder, single episode, unspecified: Secondary | ICD-10-CM | POA: Diagnosis not present

## 2019-02-09 DIAGNOSIS — F419 Anxiety disorder, unspecified: Secondary | ICD-10-CM

## 2019-02-09 DIAGNOSIS — E119 Type 2 diabetes mellitus without complications: Secondary | ICD-10-CM | POA: Diagnosis not present

## 2019-02-09 DIAGNOSIS — F32A Depression, unspecified: Secondary | ICD-10-CM

## 2019-02-09 LAB — POCT GLYCOSYLATED HEMOGLOBIN (HGB A1C): Hemoglobin A1C: 7.8 % — AB (ref 4.0–5.6)

## 2019-02-09 MED ORDER — EMPAGLIFLOZIN 10 MG PO TABS: 10.0000 mg | ORAL_TABLET | Freq: Every day | ORAL | 3 refills | Status: DC

## 2019-02-09 NOTE — Patient Instructions (Signed)
Please start jardiance daily as prescribed Please continue metformin  Please schedule appointment with psychiatrist as discussed

## 2019-02-09 NOTE — Assessment & Plan Note (Signed)
Uncontrolled We have made several medication adjustments over last several visits with no improvement in mood We discussed referral to psychiatry for further E&M, she Is agreeable, psychiatry referrals sheet given She will continue wellbutrin at current dosage until she gets to psychiatry

## 2019-02-09 NOTE — Progress Notes (Signed)
Theresa Gibson is a 63 y.o. female with the following history as recorded in Epic[employer]:  Patient Active Problem List   Diagnosis Date Noted  . Chronic pain of both knees 07/28/2018  . Diabetes mellitus without complication ([place]) 47/42/5956  . Chronic sinusitis 03/29/2017  . Situational anxiety 11/19/2016  . Myocardial infarction ([place]) 11/19/2016  . Vitamin D deficiency 09/13/2015  . Generalized headaches 04/21/2015  . Osteoarthritis 04/21/2015  . Decreased libido 09/21/2014  . CARPAL TUNNEL SYNDROME, BILATERAL 12/03/2007  . Hypersomnia with sleep apnea 06/24/2007  . CAVERNOUS HEMANGIOMA 04/18/2007  . Hyperlipidemia with target LDL less than 70 12/10/2006  . Obesity 12/10/2006  . Anxiety and depression 12/10/2006  . Essential hypertension 12/10/2006  . ALLERGIC RHINITIS 12/10/2006  . GERD 12/10/2006    Current Outpatient Medications  Medication Sig Dispense Refill  . acetaminophen (TYLENOL) 500 MG tablet Take 2 tablets (1,000 mg total) by mouth every 8 (eight) hours as needed for moderate pain. 30 tablet 0  . aspirin EC 81 MG tablet Take 81 mg by mouth daily.    Marland Kitchen atorvastatin (LIPITOR) 40 MG tablet Take 40 mg by mouth daily at 6 PM.     . blood glucose meter kit and supplies KIT Dispense based on patient and insurance preference. Use up to four times daily as directed. DX CODE: E11.9 1 each 0  . blood glucose meter kit and supplies KIT Dispense based on patient and insurance preference. Use up to four times daily as directed. Dx Code: E11.9 1 each 0  . buPROPion (WELLBUTRIN SR) 150 MG 12 hr tablet Take 1 tablet (150 mg total) by mouth 2 (two) times daily. 180 tablet 0  . carvedilol (COREG) 3.125 MG tablet Take 3.125 mg by mouth 2 (two) times daily with a meal.    . cholecalciferol (VITAMIN D) 1000 units tablet Take 1,000 Units by mouth daily.    . diclofenac (VOLTAREN) 25 MG EC tablet Take 1 tablet (25 mg total) by mouth 2 (two) times daily. 14 tablet 0  . diclofenac sodium  (VOLTAREN) 1 % GEL Apply 4 g topically 4 (four) times daily. 100 g 1  . empagliflozin (JARDIANCE) 10 MG TABS tablet Take 10 mg by mouth daily. 30 tablet 3  . fexofenadine-pseudoephedrine (ALLEGRA-D 24) 180-240 MG 24 hr tablet Take 1 tablet by mouth daily.    . fluticasone (FLONASE) 50 MCG/ACT nasal spray Place 2 sprays into both nostrils daily. 16 g 0  . losartan (COZAAR) 50 MG tablet Take 1 tablet (50 mg total) by mouth daily. 90 tablet 3  . metFORMIN (GLUCOPHAGE XR) 500 MG 24 hr tablet Take 2 tablets (1,000 mg total) by mouth 2 (two) times daily. (Patient taking differently: Take 500 mg by mouth 2 (two) times daily. ) 120 tablet 2  . nitroGLYCERIN (NITROSTAT) 0.4 MG SL tablet Place 0.4 mg under the tongue every 5 (five) minutes as needed for chest pain.    . pantoprazole (PROTONIX) 40 MG tablet Take 1 tablet (40 mg total) by mouth daily at 6 (six) AM. 30 tablet 3  . topiramate (TOPAMAX) 50 MG tablet TAKE 1 TAB BY MOUTH TWICE DAILY 180 tablet 0  . traMADol (ULTRAM) 50 MG tablet Take 1 tablet (50 mg total) by mouth every 6 (six) hours as needed. 15 tablet 0  . triamcinolone (NASACORT) 55 MCG/ACT AERO nasal inhaler Place 2 sprays into the nose daily. (Patient taking differently: Place 1-2 sprays into the nose daily as needed (NASAL CONGESTION). )    .  cetirizine (ZYRTEC) 10 MG tablet Take 10 mg by mouth daily as needed for allergies.     No current facility-administered medications for this visit.     Allergies: Nicotine and Ace inhibitors  Past Medical History:  Diagnosis Date  . Acute MI, inferior wall ([place]) 08/2016   s/p DES x 2 to the RCA  . Allergy   . Arthritis   . Depression   . GERD (gastroesophageal reflux disease)   . Hypertension   . Meningioma ([place])   . PONV (postoperative nausea and vomiting)    nausea only  . Wears dentures    upper    Past Surgical History:  Procedure Laterality Date  . ABDOMINAL HYSTERECTOMY     Total   . BREAST EXCISIONAL BIOPSY Left    approx 30+  years ago  . BREAST SURGERY     BX right breast  . COLONOSCOPY W/ BIOPSIES AND POLYPECTOMY    . CORONARY ANGIOPLASTY WITH STENT PLACEMENT    . KNEE SURGERY Bilateral    arthroscopy  . LAPAROSCOPIC APPENDECTOMY N/A 12/10/2015   Procedure: APPENDECTOMY LAPAROSCOPIC;  Surgeon: Erroll Luna, MD;  Location: [facility];  Service: General;  Laterality: N/A;  . MULTIPLE TOOTH EXTRACTIONS    . NASAL/SINUS ENDOSCOPY Bilateral 03/29/2017   ENDOSCOPIC SINUS SURGERY WITH NAVIGATION (Bilateral)  . SINUS ENDO W/FUSION Bilateral 03/29/2017   Procedure: ENDOSCOPIC SINUS SURGERY WITH NAVIGATION;  Surgeon: Melissa Montane, MD;  Location: [facility];  Service: ENT;  Laterality: Bilateral;  . TUBAL LIGATION    . TURBINATE REDUCTION Bilateral 03/29/2017   Procedure: TURBINATE REDUCTION;  Surgeon: Melissa Montane, MD;  [facility];  Service: ENT;  Laterality: Bilateral;    Family History  Problem Relation Age of Onset  . Hypertension Mother   . Arthritis Mother   . Heart attack Father        In his 1's  . Ovarian cancer Maternal Grandmother   . Colon cancer Neg Hx   . Colon polyps Neg Hx   . Rectal cancer Neg Hx   . Stomach cancer Neg Hx   . Breast cancer Neg Hx     Social History   Tobacco Use  . Smoking status: Former Smoker    Packs/day: 0.50    Years: 42.00    Pack years: 21.00    Types: Cigarettes    Last attempt to quit: 01/03/2018    Years since quitting: 1.1  . Smokeless tobacco: Never Used  Substance Use Topics  . Alcohol use: Yes    Alcohol/week: 0.0 standard drinks    Frequency: Never    Comment: occ     Subjective:  Theresa Gibson is here today for follow up of diabetes and anxiety/depression, she was actually asked to return around 11/2018 for anxiety/depression follow up, but says she lost her insurance for a period since her last visit with me on 10/15/18, so could not return until today. She is due for annual lipids/labs, but she has eaten today.  Anxiety and depression- at her last OV on  10/15/18 , she told me that she had stopped her effexor due to nausea, she was continuing to feel some daily anxiety and depression and wanted to go back on wellbutrin, wellbutrin 150 daily refill was sent, She declined counseling.  Today, she says she has resumed wellbutrin 150, now taking twice a day, but its not really helping her, she still feels depressed and down a lot of days, wonders if she should try another medication.  No SI, HI  Diabetes- maintained on metformin 500 BID, and jardiance 10 rx was sent at her 10/15/18 OV. She had actually been instructed to increase her metformin after her 07/2018 labs showed elevated a1c of 7.7, but she told me at her 10/15/18 visit that she  Could not tolerate greater than 500 BID of metformin due to nausea, so jardiance rx was sent to try. Today, she says she actually never picked up the jardiance, could not afford it, but has been trying to eat a healthier diet, she has new insurance now and thinks she might be able to afford more medications with new insurance. She has continued metformin daily as prescribed.   ROS- See HPI  Objective:  Vitals:   02/09/19 0924  BP: 110/72  Pulse: 86  SpO2: 96%  Weight: 243 lb (110.2 kg)  Height: '5\' 4"'  (1.626 m)    General: Well developed, well nourished, in no acute distress  Skin : Warm and dry.  Head: Normocephalic and atraumatic  Eyes: Sclera and conjunctiva clear; pupils round and reactive to light; extraocular movements intact  Oropharynx: Pink, supple. No suspicious lesions  Neck: Supple Lungs: Respirations unlabored; clear to auscultation bilaterally without wheeze, rales, rhonchi  CVS exam: normal rate, regular rhythm, normal S1, S2, no murmurs, rubs, clicks or gallops.  Extremities: No edema, cyanosis, clubbing  Vessels: Symmetric bilaterally  Neurologic: Alert and oriented; speech intact; face symmetrical; moves all extremities well; CNII-XII intact without focal deficit  Psychiatric: Normal mood  and affect.  Assessment:  1. Diabetes mellitus without complication ([place])   2. Anxiety and depression     Plan:  She is due for annual lipids/labs, but she has eaten today, she will return for next visit fasting  Return in about 3 months (around 05/12/2019) for diabetes follow up, annual fasting labs, CPE?Marland Kitchen  Orders Placed This Encounter  Procedures  . POCT HgB A1C    Requested Prescriptions   Signed Prescriptions Disp Refills  . empagliflozin (JARDIANCE) 10 MG TABS tablet 30 tablet 3    Sig: Take 10 mg by mouth daily.

## 2019-02-09 NOTE — Assessment & Plan Note (Addendum)
A1c remains elevated Continue healthy diet Continue metformin at current dosage, add jardiance, she will let me know if she can not afford jardiance Return to clinic in 3 months for follow up, recheck A1c - POCT HgB A1C-7.8 - empagliflozin (JARDIANCE) 10 MG TABS tablet; Take 10 mg by mouth daily.  Dispense: 30 tablet; Refill: 3

## 2019-02-20 DIAGNOSIS — M25562 Pain in left knee: Secondary | ICD-10-CM | POA: Diagnosis not present

## 2019-02-20 DIAGNOSIS — M170 Bilateral primary osteoarthritis of knee: Secondary | ICD-10-CM | POA: Diagnosis not present

## 2019-02-20 DIAGNOSIS — M25561 Pain in right knee: Secondary | ICD-10-CM | POA: Diagnosis not present

## 2019-03-24 ENCOUNTER — Ambulatory Visit (INDEPENDENT_AMBULATORY_CARE_PROVIDER_SITE_OTHER): Payer: BLUE CROSS/BLUE SHIELD | Admitting: Internal Medicine

## 2019-03-24 ENCOUNTER — Other Ambulatory Visit: Payer: Self-pay | Admitting: Internal Medicine

## 2019-03-24 ENCOUNTER — Encounter: Payer: Self-pay | Admitting: Internal Medicine

## 2019-03-24 ENCOUNTER — Telehealth: Payer: Self-pay | Admitting: Nurse Practitioner

## 2019-03-24 DIAGNOSIS — N760 Acute vaginitis: Secondary | ICD-10-CM | POA: Diagnosis not present

## 2019-03-24 DIAGNOSIS — Z7982 Long term (current) use of aspirin: Secondary | ICD-10-CM | POA: Diagnosis not present

## 2019-03-24 DIAGNOSIS — I10 Essential (primary) hypertension: Secondary | ICD-10-CM

## 2019-03-24 DIAGNOSIS — E119 Type 2 diabetes mellitus without complications: Secondary | ICD-10-CM

## 2019-03-24 DIAGNOSIS — Z955 Presence of coronary angioplasty implant and graft: Secondary | ICD-10-CM | POA: Diagnosis not present

## 2019-03-24 DIAGNOSIS — E782 Mixed hyperlipidemia: Secondary | ICD-10-CM | POA: Diagnosis not present

## 2019-03-24 DIAGNOSIS — R072 Precordial pain: Secondary | ICD-10-CM | POA: Diagnosis not present

## 2019-03-24 DIAGNOSIS — I2510 Atherosclerotic heart disease of native coronary artery without angina pectoris: Secondary | ICD-10-CM | POA: Diagnosis not present

## 2019-03-24 DIAGNOSIS — Z79899 Other long term (current) drug therapy: Secondary | ICD-10-CM | POA: Diagnosis not present

## 2019-03-24 DIAGNOSIS — Z87891 Personal history of nicotine dependence: Secondary | ICD-10-CM | POA: Diagnosis not present

## 2019-03-24 IMAGING — CR DG CERVICAL SPINE COMPLETE 4+V
7 series · 7 of 7 positions shown · non-contrast
Comparison: None.

CLINICAL DATA: Right neck pain for the past 3 months, worsening. No
known injury.

EXAM:
CERVICAL SPINE - COMPLETE 4+ VIEW

[w c-spine lat]
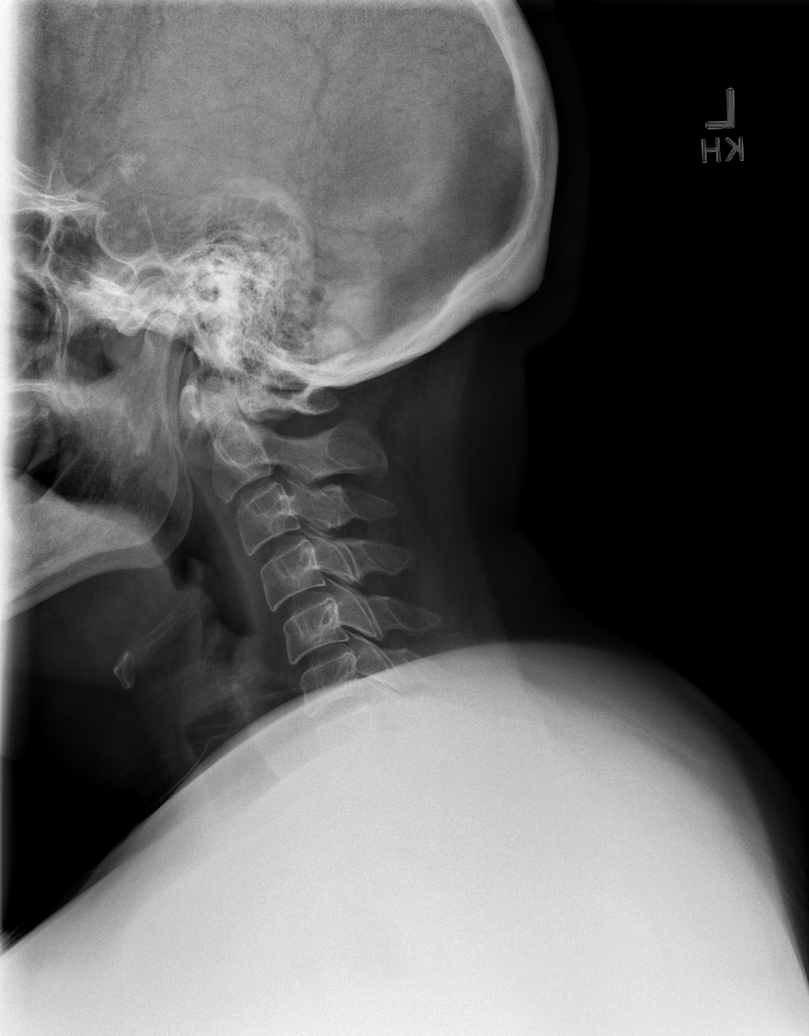

[w c-spine oblique (1 of 2)]
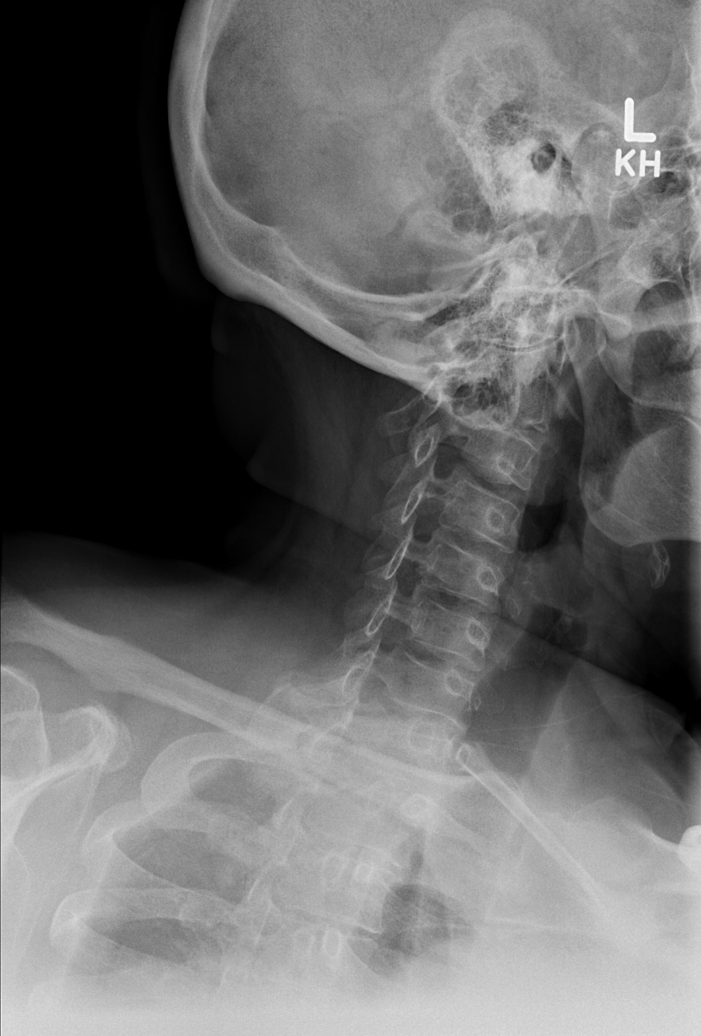

[w c-spine oblique (2 of 2)]
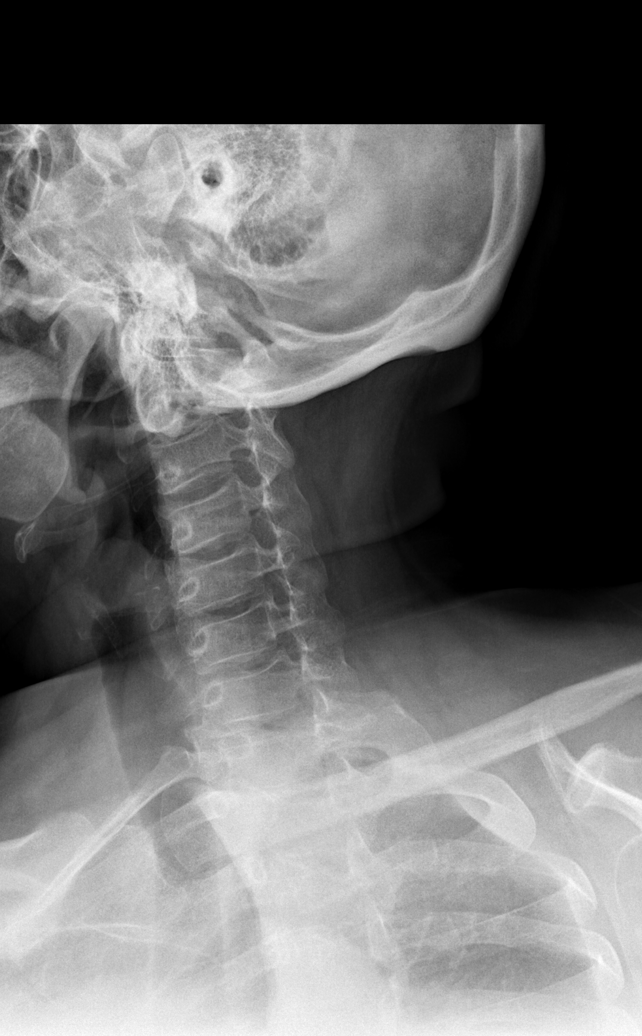

[w c-spine a.p.]
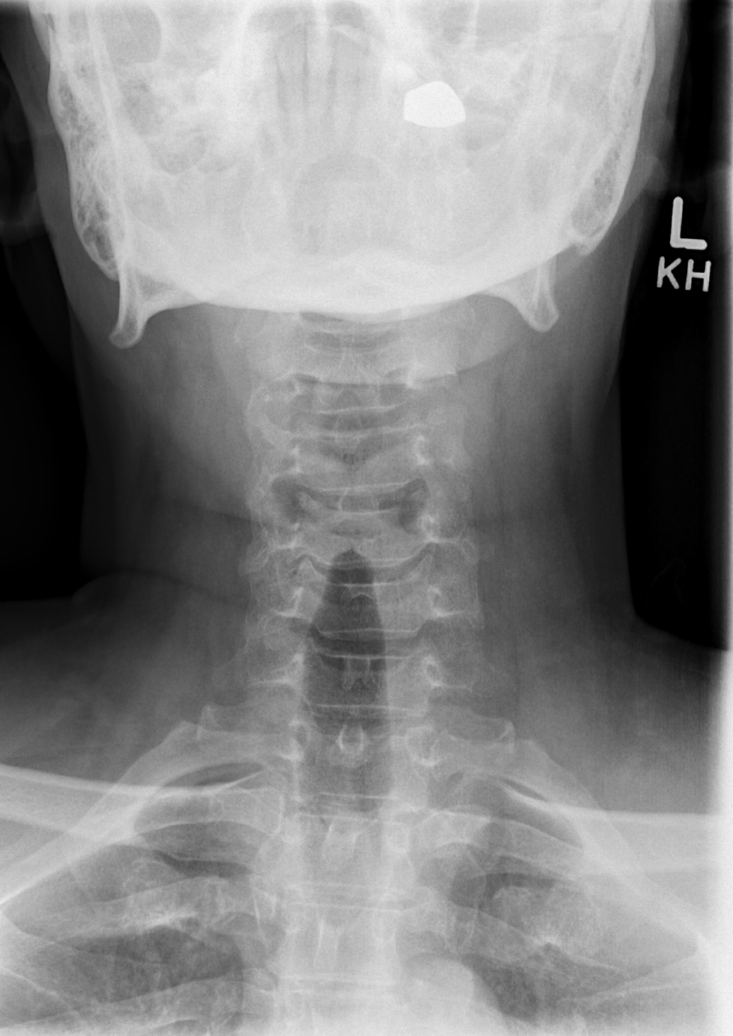

[w c-spine odontoid (1 of 2)]
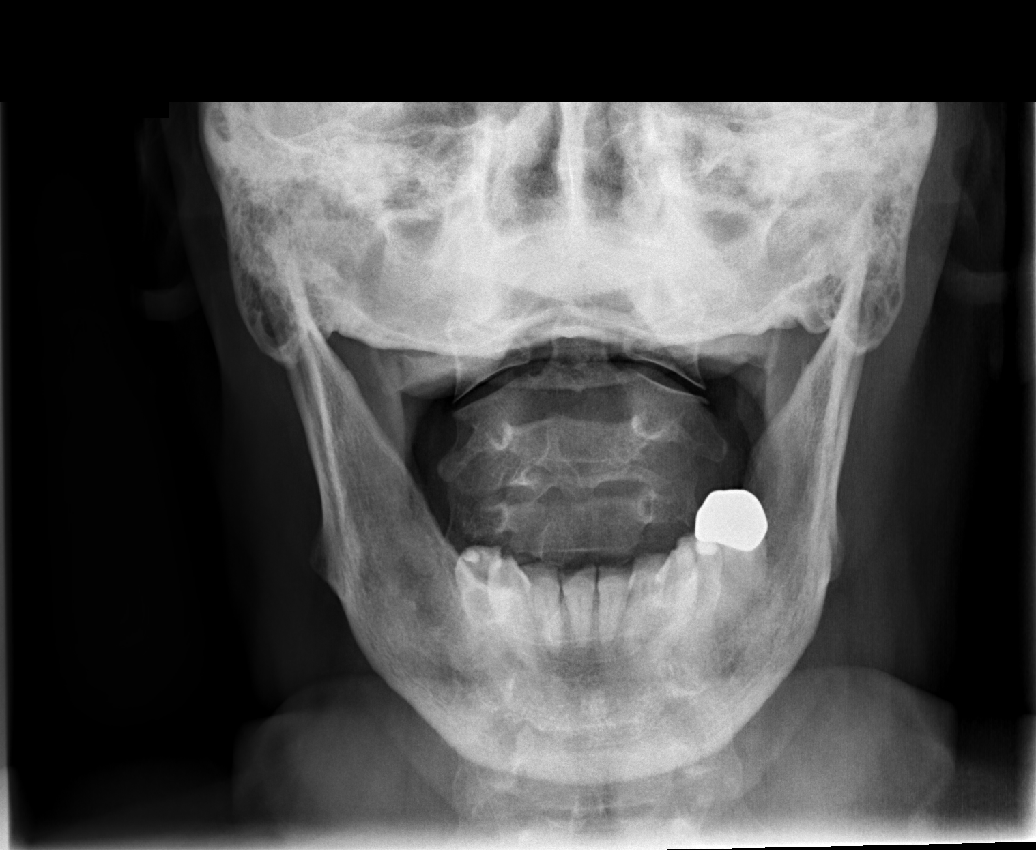

[w c-spine odontoid (2 of 2)]
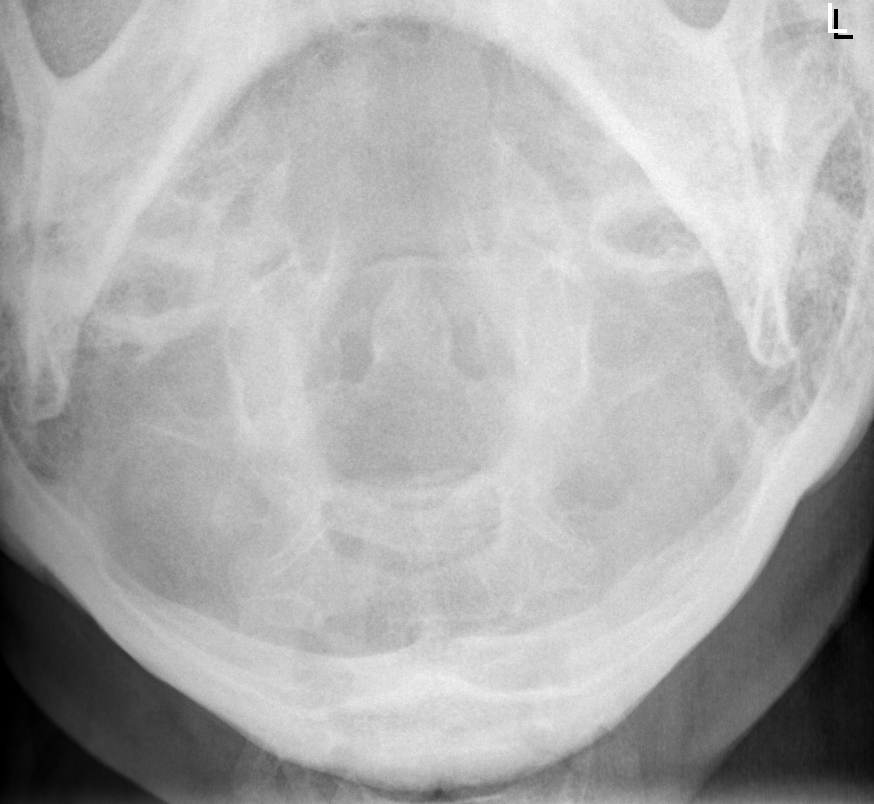

[w swimmers view]
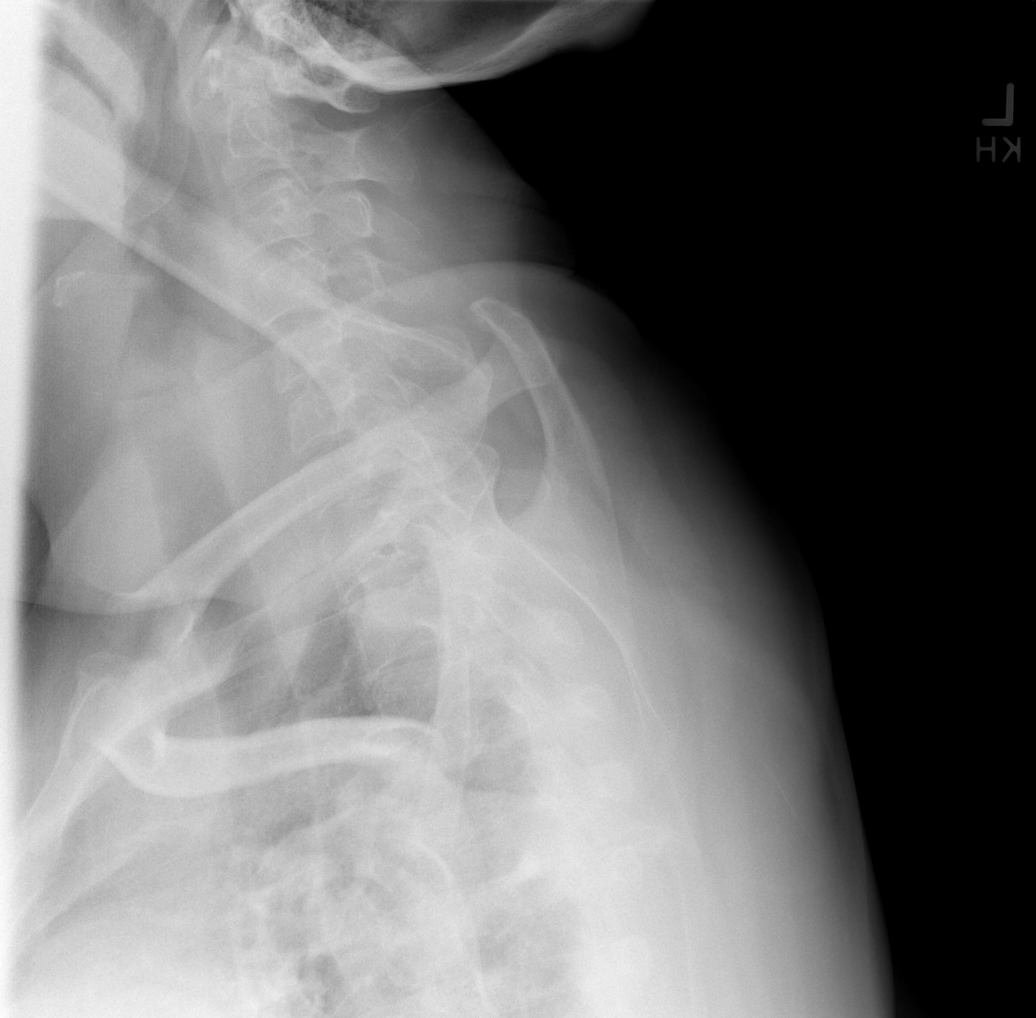

[7 of 7 positions shown; findings below may reference images not displayed]

FINDINGS: Mild reversal of the normal cervical lordosis. Minimal uncinate spur
formation on the right at the C4-5 and C5-6 levels with minimal
foraminal stenosis on the right at the C5-6 level and no significant
foraminal stenosis on the right at the C4-5 level. Otherwise, normal
appearing bones and soft tissues.
IMPRESSION: 1. Mild reversal of the normal cervical lordosis. This can be seen
with muscle spasm.
2. Minimal uncinate spur formation on the right at the C4-5 and C5-6
levels with minimal foraminal stenosis on the right at the C5-6
level.

## 2019-03-24 MED ORDER — KETOCONAZOLE 2 % EX CREA: 1.0000 "application " | TOPICAL_CREAM | Freq: Two times a day (BID) | CUTANEOUS | 1 refills | Status: DC

## 2019-03-24 MED ORDER — FLUCONAZOLE 100 MG PO TABS: 100.0000 mg | ORAL_TABLET | Freq: Every day | ORAL | 1 refills | Status: DC

## 2019-03-24 MED ORDER — FLUCONAZOLE 150 MG PO TABS: 150.0000 mg | ORAL_TABLET | Freq: Once | ORAL | 0 refills | Status: AC

## 2019-03-24 NOTE — Assessment & Plan Note (Signed)
likely atrophic and yeast together.  Will treat with p.o. Diflucan and and vaginal cream Use wet wipes instead of toilet paper

## 2019-03-24 NOTE — Telephone Encounter (Signed)
Copied from [place] [phone]. Topic: General - Other >> Mar 24, 2019  9:10 AM Keene Breath wrote: Reason for CRM: Patient called to inform the doctor [name]) that the medication she has been taking, empagliflozin (JARDIANCE) 10 MG TABS tablet, has caused her to have yeast infection, which was one of the possible side effects.  Patient would like something to treat the infection sent in for her.  Please advise and call patient back at [phone]

## 2019-03-24 NOTE — Assessment & Plan Note (Signed)
Regular blood pressure is good.  Jardiance helps.  The patient lost 10 pounds on Jardiance

## 2019-03-24 NOTE — Telephone Encounter (Signed)
Diflucan pending

## 2019-03-24 NOTE — Telephone Encounter (Signed)
Rx sent. Pt is aware.  °

## 2019-03-24 NOTE — Assessment & Plan Note (Signed)
Metformin and Jardiance

## 2019-03-24 NOTE — Progress Notes (Signed)
Virtual Visit via Telephone Note  I connected with Theresa Gibson on 03/24/19 at 10:40 AM EDT by telephone and verified that I am speaking with the correct person using two identifiers.   I discussed the limitations, risks, security and privacy concerns of performing an evaluation and management service by telephone and the availability of in person appointments. I also discussed with the patient that there may be a patient responsible charge related to this service. The patient expressed understanding and agreed to proceed.   History of Present Illness:  Former pt of [facility]  the medication she has been taking, empagliflozin (JARDIANCE) 10 MG TABS tablet, has caused her to have yeast infection, which was one of the possible side effects.  Patient would like something to treat the infection sent in for her.  There is no dysuria C/o blood small smears on the toilet paper S/p complete hysterectomy CBGs are better, she lost 10 pounds on Jardiance  Observations/Objective:  Patient looks well.  She is in no acute distress Assessment and Plan:  See plan Follow Up Instructions:    I discussed the assessment and treatment plan with the patient. The patient was provided an opportunity to ask questions and all were answered. The patient agreed with the plan and demonstrated an understanding of the instructions.   The patient was advised to call back or seek an in-person evaluation if the symptoms worsen or if the condition fails to improve as anticipated.  I provided 20 minutes of non-face-to-face time during this encounter.   Walker Kehr, MD

## 2019-03-25 DIAGNOSIS — R079 Chest pain, unspecified: Secondary | ICD-10-CM | POA: Diagnosis not present

## 2019-03-31 DIAGNOSIS — Z955 Presence of coronary angioplasty implant and graft: Secondary | ICD-10-CM | POA: Diagnosis not present

## 2019-03-31 DIAGNOSIS — I2510 Atherosclerotic heart disease of native coronary artery without angina pectoris: Secondary | ICD-10-CM | POA: Diagnosis not present

## 2019-03-31 DIAGNOSIS — R072 Precordial pain: Secondary | ICD-10-CM | POA: Diagnosis not present

## 2019-04-08 ENCOUNTER — Other Ambulatory Visit: Payer: Self-pay | Admitting: *Deleted

## 2019-04-08 DIAGNOSIS — F32A Depression, unspecified: Secondary | ICD-10-CM

## 2019-04-08 DIAGNOSIS — F419 Anxiety disorder, unspecified: Principal | ICD-10-CM

## 2019-04-08 DIAGNOSIS — F329 Major depressive disorder, single episode, unspecified: Secondary | ICD-10-CM

## 2019-04-08 MED ORDER — BUPROPION HCL ER (SR) 150 MG PO TB12: 150.0000 mg | ORAL_TABLET | Freq: Two times a day (BID) | ORAL | 0 refills | Status: DC

## 2019-04-09 DIAGNOSIS — M170 Bilateral primary osteoarthritis of knee: Secondary | ICD-10-CM | POA: Diagnosis not present

## 2019-05-01 DIAGNOSIS — M170 Bilateral primary osteoarthritis of knee: Secondary | ICD-10-CM | POA: Diagnosis not present

## 2019-05-08 DIAGNOSIS — M170 Bilateral primary osteoarthritis of knee: Secondary | ICD-10-CM | POA: Diagnosis not present

## 2019-06-11 ENCOUNTER — Telehealth: Payer: Self-pay | Admitting: *Deleted

## 2019-06-11 DIAGNOSIS — E119 Type 2 diabetes mellitus without complications: Secondary | ICD-10-CM

## 2019-06-11 MED ORDER — METFORMIN HCL 500 MG PO TABS: 500.0000 mg | ORAL_TABLET | Freq: Two times a day (BID) | ORAL | 0 refills | Status: DC

## 2019-06-11 NOTE — Telephone Encounter (Signed)
Done erx 

## 2019-06-11 NOTE — Telephone Encounter (Signed)
Left message for patient to call back. CRM created 

## 2019-06-11 NOTE — Telephone Encounter (Signed)
Received fax from pharmacy re: Metformin ER recall. Need new rx. Patient is scheduled with you 06/25/19.  Please advise. Thanks!

## 2019-06-25 ENCOUNTER — Encounter: Payer: Self-pay | Admitting: Internal Medicine

## 2019-06-25 ENCOUNTER — Ambulatory Visit (INDEPENDENT_AMBULATORY_CARE_PROVIDER_SITE_OTHER): Payer: BC Managed Care – PPO | Admitting: Internal Medicine

## 2019-06-25 ENCOUNTER — Other Ambulatory Visit: Payer: Self-pay | Admitting: Internal Medicine

## 2019-06-25 ENCOUNTER — Other Ambulatory Visit: Payer: Self-pay

## 2019-06-25 ENCOUNTER — Other Ambulatory Visit (INDEPENDENT_AMBULATORY_CARE_PROVIDER_SITE_OTHER): Payer: BC Managed Care – PPO

## 2019-06-25 VITALS — BP 124/82 | HR 80 | Temp 98.4°F | Ht 64.0 in | Wt 238.0 lb

## 2019-06-25 DIAGNOSIS — Z0001 Encounter for general adult medical examination with abnormal findings: Secondary | ICD-10-CM | POA: Insufficient documentation

## 2019-06-25 DIAGNOSIS — Z0000 Encounter for general adult medical examination without abnormal findings: Secondary | ICD-10-CM | POA: Diagnosis not present

## 2019-06-25 DIAGNOSIS — E119 Type 2 diabetes mellitus without complications: Secondary | ICD-10-CM

## 2019-06-25 DIAGNOSIS — E538 Deficiency of other specified B group vitamins: Secondary | ICD-10-CM | POA: Diagnosis not present

## 2019-06-25 DIAGNOSIS — N760 Acute vaginitis: Secondary | ICD-10-CM | POA: Diagnosis not present

## 2019-06-25 DIAGNOSIS — Z1159 Encounter for screening for other viral diseases: Secondary | ICD-10-CM | POA: Diagnosis not present

## 2019-06-25 DIAGNOSIS — E611 Iron deficiency: Secondary | ICD-10-CM | POA: Diagnosis not present

## 2019-06-25 DIAGNOSIS — Z23 Encounter for immunization: Secondary | ICD-10-CM | POA: Diagnosis not present

## 2019-06-25 DIAGNOSIS — E559 Vitamin D deficiency, unspecified: Secondary | ICD-10-CM

## 2019-06-25 LAB — IBC PANEL
Iron: 52 ug/dL (ref 42–145)
Saturation Ratios: 15.2 % — ABNORMAL LOW (ref 20.0–50.0)
Transferrin: 244 mg/dL (ref 212.0–360.0)

## 2019-06-25 LAB — CBC WITH DIFFERENTIAL/PLATELET
Basophils Absolute: 0 10*3/uL (ref 0.0–0.1)
Basophils Relative: 0.1 % (ref 0.0–3.0)
Eosinophils Absolute: 0.3 10*3/uL (ref 0.0–0.7)
Eosinophils Relative: 3.1 % (ref 0.0–5.0)
HCT: 44.1 % (ref 36.0–46.0)
Hemoglobin: 14.3 g/dL (ref 12.0–15.0)
Lymphocytes Relative: 29.7 % (ref 12.0–46.0)
Lymphs Abs: 2.6 10*3/uL (ref 0.7–4.0)
MCHC: 32.3 g/dL (ref 30.0–36.0)
MCV: 84.4 fl (ref 78.0–100.0)
Monocytes Absolute: 0.7 10*3/uL (ref 0.1–1.0)
Monocytes Relative: 7.9 % (ref 3.0–12.0)
Neutro Abs: 5.2 10*3/uL (ref 1.4–7.7)
Neutrophils Relative %: 59.2 % (ref 43.0–77.0)
Platelets: 264 10*3/uL (ref 150.0–400.0)
RBC: 5.23 Mil/uL — ABNORMAL HIGH (ref 3.87–5.11)
RDW: 16.5 % — ABNORMAL HIGH (ref 11.5–15.5)
WBC: 8.7 10*3/uL (ref 4.0–10.5)

## 2019-06-25 LAB — BASIC METABOLIC PANEL
BUN: 19 mg/dL (ref 6–23)
CO2: 25 mEq/L (ref 19–32)
Calcium: 9.7 mg/dL (ref 8.4–10.5)
Chloride: 106 mEq/L (ref 96–112)
Creatinine, Ser: 0.78 mg/dL (ref 0.40–1.20)
GFR: 90.84 mL/min (ref >60.00)
Glucose, Bld: 123 mg/dL — ABNORMAL HIGH (ref 70–99)
Potassium: 4.4 mEq/L (ref 3.5–5.1)
Sodium: 139 mEq/L (ref 135–145)

## 2019-06-25 LAB — TSH: TSH: 0.89 u[IU]/mL (ref 0.35–4.50)

## 2019-06-25 LAB — HEMOGLOBIN A1C: Hgb A1c MFr Bld: 7.4 % — ABNORMAL HIGH (ref 4.6–6.5)

## 2019-06-25 LAB — HEPATIC FUNCTION PANEL
ALT: 20 U/L (ref 0–35)
AST: 15 U/L (ref 0–37)
Albumin: 4.3 g/dL (ref 3.5–5.2)
Alkaline Phosphatase: 100 U/L (ref 39–117)
Bilirubin, Direct: 0.1 mg/dL (ref 0.0–0.3)
Total Bilirubin: 0.4 mg/dL (ref 0.2–1.2)
Total Protein: 7.4 g/dL (ref 6.0–8.3)

## 2019-06-25 LAB — LIPID PANEL
Cholesterol: 160 mg/dL (ref 0–200)
HDL: 61.4 mg/dL (ref >39.00)
LDL Cholesterol: 81 mg/dL (ref 0–99)
NonHDL: 98.39
Total CHOL/HDL Ratio: 3
Triglycerides: 89 mg/dL (ref 0.0–149.0)
VLDL: 17.8 mg/dL (ref 0.0–40.0)

## 2019-06-25 LAB — URINALYSIS, ROUTINE W REFLEX MICROSCOPIC
Bilirubin Urine: NEGATIVE
Hgb urine dipstick: NEGATIVE
Ketones, ur: NEGATIVE
Leukocytes,Ua: NEGATIVE
Nitrite: NEGATIVE
Specific Gravity, Urine: 1.015 (ref 1.000–1.030)
Total Protein, Urine: NEGATIVE
Urine Glucose: >=1000 — AB
Urobilinogen, UA: 0.2 (ref 0.0–1.0)
pH: 7 (ref 5.0–8.0)

## 2019-06-25 LAB — MICROALBUMIN / CREATININE URINE RATIO
Creatinine,U: 40.1 mg/dL
Microalb Creat Ratio: 1.7 mg/g (ref 0.0–30.0)
Microalb, Ur: <0.7 mg/dL (ref 0.0–1.9)

## 2019-06-25 LAB — VITAMIN D 25 HYDROXY (VIT D DEFICIENCY, FRACTURES): VITD: 28.34 ng/mL — ABNORMAL LOW (ref 30.00–100.00)

## 2019-06-25 LAB — VITAMIN B12: Vitamin B-12: >1500 pg/mL — ABNORMAL HIGH (ref 211–911)

## 2019-06-25 MED ORDER — FLUCONAZOLE 100 MG PO TABS: 100.0000 mg | ORAL_TABLET | Freq: Every day | ORAL | 0 refills | Status: DC

## 2019-06-25 MED ORDER — VITAMIN D (ERGOCALCIFEROL) 1.25 MG (50000 UNIT) PO CAPS: 50000.0000 [IU] | ORAL_CAPSULE | ORAL | 0 refills | Status: DC

## 2019-06-25 NOTE — Progress Notes (Signed)
Subjective:    Patient ID: Theresa Gibson, female    DOB: [birth year], 63 y.o.   MRN: [id number]  HPI  Here for wellness and f/u;  Overall doing ok;  Pt denies Chest pain, worsening SOB, DOE, wheezing, orthopnea, PND, worsening LE edema, palpitations, dizziness or syncope.  Pt denies neurological change such as new headache, facial or extremity weakness.  Pt denies polydipsia, polyuria, or low sugar symptoms. Pt states overall good compliance with treatment and medications, good tolerability, and has been trying to follow appropriate diet.  Pt denies worsening depressive symptoms, suicidal ideation or panic. No fever, night sweats, wt loss, loss of appetite, or other constitutional symptoms.  Pt states good ability with ADL's, has low fall risk, home safety reviewed and adequate, no other significant changes in hearing or vision, and not active with exercise. Is S/p bilat knee gel injections per ortho, last done about 6 wks, not really helping, has f/fu ortho tomorrow - Dr Wynelle Link.  Has been trying to avoid TKR bilat for now but maybe soon.   Applying for SSI, used to work as Nutritional therapist.  Depressing to her, still on wellbutrin but only taking once per day, Denies worsening depressive symptoms, suicidal ideation, or panic; has ongoing anxiety.  Quit smoking about 2 -3 yrs ago. Trying to lose wt but difficult to knee pain.  Also has some tinnitus and hearing loss to right ear.   Past Medical History:  Diagnosis Date  . Acute MI, inferior wall ([place]) 08/2016   s/p DES x 2 to the RCA  . Allergy   . Arthritis   . Depression   . GERD (gastroesophageal reflux disease)   . Hypertension   . Meningioma ([place])   . PONV (postoperative nausea and vomiting)    nausea only  . Wears dentures    upper   Past Surgical History:  Procedure Laterality Date  . ABDOMINAL HYSTERECTOMY     Total   . BREAST EXCISIONAL BIOPSY Left    approx 30+ years ago  . BREAST SURGERY     BX right breast  . COLONOSCOPY W/  BIOPSIES AND POLYPECTOMY    . CORONARY ANGIOPLASTY WITH STENT PLACEMENT    . KNEE SURGERY Bilateral    arthroscopy  . LAPAROSCOPIC APPENDECTOMY N/A 12/10/2015   Procedure: APPENDECTOMY LAPAROSCOPIC;  Surgeon: Erroll Luna, MD;  Location: [place];  Service: General;  Laterality: N/A;  . MULTIPLE TOOTH EXTRACTIONS    . NASAL/SINUS ENDOSCOPY Bilateral 03/29/2017   ENDOSCOPIC SINUS SURGERY WITH NAVIGATION (Bilateral)  . SINUS ENDO W/FUSION Bilateral 03/29/2017   Procedure: ENDOSCOPIC SINUS SURGERY WITH NAVIGATION;  Surgeon: Melissa Montane, MD;  Location: [place];  Service: ENT;  Laterality: Bilateral;  . TUBAL LIGATION    . TURBINATE REDUCTION Bilateral 03/29/2017   Procedure: TURBINATE REDUCTION;  Surgeon: Melissa Montane, MD;  Location: [place];  Service: ENT;  Laterality: Bilateral;    reports that she quit smoking about 17 months ago. Her smoking use included cigarettes. She has a 21.00 pack-year smoking history. She has never used smokeless tobacco. She reports current alcohol use. She reports that she does not use drugs. family history includes Arthritis in her mother; Heart attack in her father; Hypertension in her mother; Ovarian cancer in her maternal grandmother. Allergies  Allergen Reactions  . Nicotine     Patch...caused a rash  . Ace Inhibitors Cough    REACTION: Dry cough-lisinopril    Current Outpatient Medications on File Prior to Visit  Medication  Sig Dispense Refill  . acetaminophen (TYLENOL) 500 MG tablet Take 2 tablets (1,000 mg total) by mouth every 8 (eight) hours as needed for moderate pain. 30 tablet 0  . aspirin EC 81 MG tablet Take 81 mg by mouth daily.    Marland Kitchen atorvastatin (LIPITOR) 40 MG tablet Take 40 mg by mouth daily at 6 PM.     . blood glucose meter kit and supplies KIT Dispense based on patient and insurance preference. Use up to four times daily as directed. DX CODE: E11.9 1 each 0  . blood glucose meter kit and supplies KIT Dispense based on patient and insurance  preference. Use up to four times daily as directed. Dx Code: E11.9 1 each 0  . buPROPion (WELLBUTRIN SR) 150 MG 12 hr tablet Take 1 tablet (150 mg total) by mouth 2 (two) times daily. Need to establish with new provider. 180 tablet 0  . carvedilol (COREG) 3.125 MG tablet Take 3.125 mg by mouth 2 (two) times daily with a meal.    . cetirizine (ZYRTEC) 10 MG tablet Take 10 mg by mouth daily as needed for allergies.    . cholecalciferol (VITAMIN D) 1000 units tablet Take 1,000 Units by mouth daily.    . diclofenac (VOLTAREN) 25 MG EC tablet Take 1 tablet (25 mg total) by mouth 2 (two) times daily. 14 tablet 0  . diclofenac sodium (VOLTAREN) 1 % GEL Apply 4 g topically 4 (four) times daily. 100 g 1  . empagliflozin (JARDIANCE) 10 MG TABS tablet Take 10 mg by mouth daily. 30 tablet 3  . fexofenadine-pseudoephedrine (ALLEGRA-D 24) 180-240 MG 24 hr tablet Take 1 tablet by mouth daily.    . fluticasone (FLONASE) 50 MCG/ACT nasal spray Place 2 sprays into both nostrils daily. 16 g 0  . ketoconazole (NIZORAL) 2 % cream Apply 1 application topically 2 (two) times daily. Use for vaginal area for 10 days 45 g 1  . losartan (COZAAR) 50 MG tablet Take 1 tablet (50 mg total) by mouth daily. 90 tablet 3  . metFORMIN (GLUCOPHAGE) 500 MG tablet Take 1 tablet (500 mg total) by mouth 2 (two) times daily with a meal. Keep follow-up appt in July for future refills 60 tablet 0  . nitroGLYCERIN (NITROSTAT) 0.4 MG SL tablet Place 0.4 mg under the tongue every 5 (five) minutes as needed for chest pain.    . pantoprazole (PROTONIX) 40 MG tablet Take 1 tablet (40 mg total) by mouth daily at 6 (six) AM. 30 tablet 3  . topiramate (TOPAMAX) 50 MG tablet TAKE 1 TAB BY MOUTH TWICE DAILY 180 tablet 0  . traMADol (ULTRAM) 50 MG tablet Take 1 tablet (50 mg total) by mouth every 6 (six) hours as needed. 15 tablet 0  . triamcinolone (NASACORT) 55 MCG/ACT AERO nasal inhaler Place 2 sprays into the nose daily. (Patient taking differently:  Place 1-2 sprays into the nose daily as needed (NASAL CONGESTION). )     No current facility-administered medications on file prior to visit.    Review of Systems Constitutional: Negative for other unusual diaphoresis, sweats, appetite or weight changes HENT: Negative for other worsening hearing loss, ear pain, facial swelling, mouth sores or neck stiffness.   Eyes: Negative for other worsening pain, redness or other visual disturbance.  Respiratory: Negative for other stridor or swelling Cardiovascular: Negative for other palpitations or other chest pain  Gastrointestinal: Negative for worsening diarrhea or loose stools, blood in stool, distention or other pain Genitourinary: Negative for  hematuria, flank pain or other change in urine volume.  Musculoskeletal: Negative for myalgias or other joint swelling.  Skin: Negative for other color change, or other wound or worsening drainage.  Neurological: Negative for other syncope or numbness. Hematological: Negative for other adenopathy or swelling Psychiatric/Behavioral: Negative for hallucinations, other worsening agitation, SI, self-injury, or new decreased concentration All other system neg per pt    Objective:   Physical Exam BP 124/82   Pulse 80   Temp 98.4 F (36.9 C) (Oral)   Ht '5\' 4"'  (1.626 m)   Wt 238 lb (108 kg)   SpO2 97%   BMI 40.85 kg/m  VS noted,  Constitutional: Pt is oriented to person, place, and time. Appears well-developed and well-nourished, in no significant distress and comfortable Head: Normocephalic and atraumatic  Eyes: Conjunctivae and EOM are normal. Pupils are equal, round, and reactive to light Right Ear: External ear normal without discharge Left Ear: External ear normal without discharge Nose: Nose without discharge or deformity Mouth/Throat: Oropharynx is without other ulcerations and moist  Neck: Normal range of motion. Neck supple. No JVD present. No tracheal deviation present or significant neck LA  or mass Cardiovascular: Normal rate, regular rhythm, normal heart sounds and intact distal pulses.   Pulmonary/Chest: WOB normal and breath sounds without rales or wheezing  Abdominal: Soft. Bowel sounds are normal. NT. No HSM  Musculoskeletal: Normal range of motion. Exhibits no edema Lymphadenopathy: Has no other cervical adenopathy.  Neurological: Pt is alert and oriented to person, place, and time. Pt has normal reflexes. No cranial nerve deficit. Motor grossly intact, Gait intact Skin: Skin is warm and dry. No rash noted or new ulcerations Psychiatric:  Has normal mood and affect. Behavior is normal without agitation No other exam findings Lab Results  Component Value Date   WBC 8.7 06/25/2019   HGB 14.3 06/25/2019   HCT 44.1 06/25/2019   PLT 264.0 06/25/2019   GLUCOSE 123 (H) 06/25/2019   CHOL 160 06/25/2019   TRIG 89.0 06/25/2019   HDL 61.40 06/25/2019   LDLCALC 81 06/25/2019   ALT 20 06/25/2019   AST 15 06/25/2019   NA 139 06/25/2019   K 4.4 06/25/2019   CL 106 06/25/2019   CREATININE 0.78 06/25/2019   BUN 19 06/25/2019   CO2 25 06/25/2019   TSH 0.89 06/25/2019   INR 1.08 03/29/2017   HGBA1C 7.4 (H) 06/25/2019   MICROALBUR <0.7 06/25/2019        Assessment & Plan:

## 2019-06-25 NOTE — Assessment & Plan Note (Signed)

## 2019-06-25 NOTE — Assessment & Plan Note (Signed)
For diflucan asd

## 2019-06-25 NOTE — Assessment & Plan Note (Signed)
stable overall by history and exam, recent data reviewed with pt, and pt to continue medical treatment as before,  to f/u any worsening symptoms or concerns  

## 2019-06-25 NOTE — Assessment & Plan Note (Signed)
For vit d level f/u lab 

## 2019-06-25 NOTE — Patient Instructions (Signed)
You had the Prevnar 13 pneumonia shot today  Please take all new medication as prescribed - the diflucan for 1 week  Please continue all other medications as before, and refills have been done if requested.  Please have the pharmacy call with any other refills you may need.  Please continue your efforts at being more active, low cholesterol diet, and weight control.  You are otherwise up to date with prevention measures today.  Please keep your appointments with your specialists as you may have planned  Please go to the LAB in the Basement (turn left off the elevator) for the tests to be done today  You will be contacted by phone if any changes need to be made immediately.  Otherwise, you will receive a letter about your results with an explanation, but please check with MyChart first.  Please remember to sign up for MyChart if you have not done so, as this will be important to you in the future with finding out test results, communicating by private email, and scheduling acute appointments online when needed.  Please return in 6 months, or sooner if needed, with Lab testing done 3-5 days before

## 2019-06-26 ENCOUNTER — Telehealth: Payer: Self-pay

## 2019-06-26 DIAGNOSIS — M25562 Pain in left knee: Secondary | ICD-10-CM | POA: Diagnosis not present

## 2019-06-26 DIAGNOSIS — M25561 Pain in right knee: Secondary | ICD-10-CM | POA: Diagnosis not present

## 2019-06-26 LAB — HEPATITIS C ANTIBODY
Hepatitis C Ab: NONREACTIVE
SIGNAL TO CUT-OFF: 0.01 (ref <1.00)

## 2019-06-26 NOTE — Telephone Encounter (Signed)
Pt has viewed results via MyChart  

## 2019-06-26 NOTE — Telephone Encounter (Signed)
-----   Message from Biagio Borg, MD sent at 06/25/2019  4:58 PM EDT ----- Left message on MyChart, pt to cont same tx except  The test results show that your current treatment is OK, as the tests are stable or improved, except the Vitamin D level is low.  Please take Vitamin D 50000 units weekly for 12 weeks, then plan to change to OTC Vitamin D3 at 2000 units per day, indefinitely.Redmond Baseman to please inform pt, I will do rx

## 2019-07-21 ENCOUNTER — Telehealth: Payer: Self-pay | Admitting: Internal Medicine

## 2019-07-21 NOTE — Telephone Encounter (Signed)
Medication: empagliflozin (JARDIANCE) 10 MG TABS tablet    Patient states her insurance will no longer cover this medication. She can no longer afford it. Patient inquired if something similar can be called into pharmacy in it's place.

## 2019-07-21 NOTE — Telephone Encounter (Signed)
Pt has been informed to check with her insurance company to see what alternative meds that are covered. She stated she will call back tomorrow with additional information.

## 2019-08-19 DIAGNOSIS — H903 Sensorineural hearing loss, bilateral: Secondary | ICD-10-CM | POA: Diagnosis not present

## 2019-08-28 ENCOUNTER — Telehealth: Payer: Self-pay | Admitting: *Deleted

## 2019-08-28 DIAGNOSIS — E119 Type 2 diabetes mellitus without complications: Secondary | ICD-10-CM

## 2019-08-28 NOTE — Telephone Encounter (Signed)
Copied from [place] [id number]. Topic: General - Inquiry >> Aug 28, 2019 12:26 PM Virl Axe D wrote: Reason for CRM: Pt stated she would like to have her A1C checked. Requesting orders for lab. Please advise.

## 2019-08-31 NOTE — Telephone Encounter (Signed)
Pt informed of below. She states she will come next week to get labs drawn and flu vaccine.

## 2019-08-31 NOTE — Telephone Encounter (Signed)
Ok this is done 

## 2019-09-07 ENCOUNTER — Other Ambulatory Visit (INDEPENDENT_AMBULATORY_CARE_PROVIDER_SITE_OTHER): Payer: BC Managed Care – PPO

## 2019-09-07 ENCOUNTER — Other Ambulatory Visit: Payer: Self-pay | Admitting: Internal Medicine

## 2019-09-07 ENCOUNTER — Ambulatory Visit (INDEPENDENT_AMBULATORY_CARE_PROVIDER_SITE_OTHER): Payer: BC Managed Care – PPO

## 2019-09-07 ENCOUNTER — Other Ambulatory Visit: Payer: Self-pay

## 2019-09-07 DIAGNOSIS — E119 Type 2 diabetes mellitus without complications: Secondary | ICD-10-CM | POA: Diagnosis not present

## 2019-09-07 DIAGNOSIS — Z23 Encounter for immunization: Secondary | ICD-10-CM | POA: Diagnosis not present

## 2019-09-07 LAB — HEMOGLOBIN A1C: Hgb A1c MFr Bld: 7.9 % — ABNORMAL HIGH (ref 4.6–6.5)

## 2019-09-07 MED ORDER — JARDIANCE 25 MG PO TABS: 25.0000 mg | ORAL_TABLET | Freq: Every day | ORAL | 3 refills | Status: DC

## 2019-09-29 DIAGNOSIS — I10 Essential (primary) hypertension: Secondary | ICD-10-CM | POA: Diagnosis not present

## 2019-09-29 DIAGNOSIS — I2510 Atherosclerotic heart disease of native coronary artery without angina pectoris: Secondary | ICD-10-CM | POA: Diagnosis not present

## 2019-09-29 DIAGNOSIS — E782 Mixed hyperlipidemia: Secondary | ICD-10-CM | POA: Diagnosis not present

## 2019-09-29 DIAGNOSIS — Z955 Presence of coronary angioplasty implant and graft: Secondary | ICD-10-CM | POA: Diagnosis not present

## 2019-09-30 ENCOUNTER — Other Ambulatory Visit: Payer: Self-pay | Admitting: Internal Medicine

## 2019-10-02 ENCOUNTER — Other Ambulatory Visit: Payer: Self-pay | Admitting: Internal Medicine

## 2019-10-02 DIAGNOSIS — M25561 Pain in right knee: Secondary | ICD-10-CM | POA: Diagnosis not present

## 2019-10-02 DIAGNOSIS — M25562 Pain in left knee: Secondary | ICD-10-CM | POA: Diagnosis not present

## 2019-10-02 DIAGNOSIS — M170 Bilateral primary osteoarthritis of knee: Secondary | ICD-10-CM | POA: Diagnosis not present

## 2019-10-02 DIAGNOSIS — F419 Anxiety disorder, unspecified: Secondary | ICD-10-CM

## 2019-10-02 DIAGNOSIS — F329 Major depressive disorder, single episode, unspecified: Secondary | ICD-10-CM

## 2019-10-02 DIAGNOSIS — F32A Depression, unspecified: Secondary | ICD-10-CM

## 2019-10-15 DIAGNOSIS — R635 Abnormal weight gain: Secondary | ICD-10-CM | POA: Diagnosis not present

## 2019-10-22 DIAGNOSIS — F419 Anxiety disorder, unspecified: Secondary | ICD-10-CM | POA: Diagnosis not present

## 2019-10-22 DIAGNOSIS — E669 Obesity, unspecified: Secondary | ICD-10-CM | POA: Diagnosis not present

## 2019-10-22 DIAGNOSIS — E119 Type 2 diabetes mellitus without complications: Secondary | ICD-10-CM | POA: Diagnosis not present

## 2019-10-22 DIAGNOSIS — Z6841 Body Mass Index (BMI) 40.0 and over, adult: Secondary | ICD-10-CM | POA: Diagnosis not present

## 2019-11-09 ENCOUNTER — Encounter: Payer: Self-pay | Admitting: Internal Medicine

## 2019-11-09 ENCOUNTER — Ambulatory Visit (INDEPENDENT_AMBULATORY_CARE_PROVIDER_SITE_OTHER): Payer: BC Managed Care – PPO | Admitting: Internal Medicine

## 2019-11-09 DIAGNOSIS — J309 Allergic rhinitis, unspecified: Secondary | ICD-10-CM

## 2019-11-09 DIAGNOSIS — E119 Type 2 diabetes mellitus without complications: Secondary | ICD-10-CM | POA: Diagnosis not present

## 2019-11-09 DIAGNOSIS — J329 Chronic sinusitis, unspecified: Secondary | ICD-10-CM | POA: Diagnosis not present

## 2019-11-09 MED ORDER — PREDNISONE 10 MG PO TABS: ORAL_TABLET | ORAL | 0 refills | Status: DC

## 2019-11-09 MED ORDER — AZITHROMYCIN 250 MG PO TABS: ORAL_TABLET | ORAL | 0 refills | Status: DC

## 2019-11-09 NOTE — Assessment & Plan Note (Signed)
/  Mild to mod, for predpac asd,  to f/u any worsening symptoms or concerns 

## 2019-11-09 NOTE — Assessment & Plan Note (Signed)
stable overall by history and exam, recent data reviewed with pt, and pt to continue medical treatment as before,  to f/u any worsening symptoms or concerns  

## 2019-11-09 NOTE — Assessment & Plan Note (Signed)
Mild to mod, for antibx course,  to f/u any worsening symptoms or concerns 

## 2019-11-09 NOTE — Patient Instructions (Signed)
Please take all new medication as prescribed  Please continue all other medications as before, and refills have been done if requested.  Please have the pharmacy call with any other refills you may need.  Please keep your appointments with your specialists as you may have planned     

## 2019-11-09 NOTE — Progress Notes (Signed)
Patient ID: Theresa Gibson, female   DOB: [birth year], 63 y.o.   MRN: [id number]  Virtual Visit via Video Note  I connected with [name] on 11/09/19 at 10:00 AM EST by a video enabled telemedicine application and verified that I am speaking with the correct person using two identifiers.  Location: Patient: at home Provider: at office   I discussed the limitations of evaluation and management by telemedicine and the availability of in person appointments. The patient expressed understanding and agreed to proceed.  History of Present Illness:  Here with 2-3 days acute onset fever, facial pain, pressure, headache, general weakness and malaise, and greenish d/c, with mild ST and cough, but pt denies chest pain, wheezing, increased sob or doe, orthopnea, PND, increased LE swelling, palpitations, dizziness or syncope.  Does have several wks ongoing nasal allergy symptoms with clearish congestion, itch and sneezing, without fever, pain, ST, cough, swelling or wheezing.  Pt denies new neurological symptoms such as new headache, or facial or extremity weakness or numbness   Pt denies polydipsia, polyuria Past Medical History:  Diagnosis Date  . Acute MI, inferior wall ([place]) 08/2016   s/p DES x 2 to the RCA  . Allergy   . Arthritis   . Depression   . GERD (gastroesophageal reflux disease)   . Hypertension   . Meningioma ([place])   . PONV (postoperative nausea and vomiting)    nausea only  . Wears dentures    upper   Past Surgical History:  Procedure Laterality Date  . ABDOMINAL HYSTERECTOMY     Total   . BREAST EXCISIONAL BIOPSY Left    approx 30+ years ago  . BREAST SURGERY     BX right breast  . COLONOSCOPY W/ BIOPSIES AND POLYPECTOMY    . CORONARY ANGIOPLASTY WITH STENT PLACEMENT    . KNEE SURGERY Bilateral    arthroscopy  . LAPAROSCOPIC APPENDECTOMY N/A 12/10/2015   Procedure: APPENDECTOMY LAPAROSCOPIC;  Surgeon: Erroll Luna, MD;  Location: [place];  Service: General;   Laterality: N/A;  . MULTIPLE TOOTH EXTRACTIONS    . NASAL/SINUS ENDOSCOPY Bilateral 03/29/2017   ENDOSCOPIC SINUS SURGERY WITH NAVIGATION (Bilateral)  . SINUS ENDO W/FUSION Bilateral 03/29/2017   Procedure: ENDOSCOPIC SINUS SURGERY WITH NAVIGATION;  Surgeon: Melissa Montane, MD;  Location: [place];  Service: ENT;  Laterality: Bilateral;  . TUBAL LIGATION    . TURBINATE REDUCTION Bilateral 03/29/2017   Procedure: TURBINATE REDUCTION;  Surgeon: Melissa Montane, MD;  Location: [place];  Service: ENT;  Laterality: Bilateral;    reports that she quit smoking about 22 months ago. Her smoking use included cigarettes. She has a 21.00 pack-year smoking history. She has never used smokeless tobacco. She reports current alcohol use. She reports that she does not use drugs. family history includes Arthritis in her mother; Heart attack in her father; Hypertension in her mother; Ovarian cancer in her maternal grandmother. Allergies  Allergen Reactions  . Nicotine     Patch...caused a rash  . Ace Inhibitors Cough    REACTION: Dry cough-lisinopril    Current Outpatient Medications on File Prior to Visit  Medication Sig Dispense Refill  . acetaminophen (TYLENOL) 500 MG tablet Take 2 tablets (1,000 mg total) by mouth every 8 (eight) hours as needed for moderate pain. 30 tablet 0  . aspirin EC 81 MG tablet Take 81 mg by mouth daily.    Marland Kitchen atorvastatin (LIPITOR) 40 MG tablet Take 40 mg by mouth daily at 6 PM.     .  blood glucose meter kit and supplies KIT Dispense based on patient and insurance preference. Use up to four times daily as directed. DX CODE: E11.9 1 each 0  . blood glucose meter kit and supplies KIT Dispense based on patient and insurance preference. Use up to four times daily as directed. Dx Code: E11.9 1 each 0  . buPROPion (WELLBUTRIN SR) 150 MG 12 hr tablet TAKE 1 TABLET BY MOUTH TWICE DAILY NEED  TO  ESTABLISH  WITH  NEW  PROVIDER 180 tablet 0  . carvedilol (COREG) 3.125 MG tablet Take 3.125 mg by mouth 2  (two) times daily with a meal.    . cetirizine (ZYRTEC) 10 MG tablet Take 10 mg by mouth daily as needed for allergies.    . cholecalciferol (VITAMIN D) 1000 units tablet Take 1,000 Units by mouth daily.    . diclofenac (VOLTAREN) 25 MG EC tablet Take 1 tablet (25 mg total) by mouth 2 (two) times daily. 14 tablet 0  . diclofenac sodium (VOLTAREN) 1 % GEL Apply 4 g topically 4 (four) times daily. 100 g 1  . empagliflozin (JARDIANCE) 25 MG TABS tablet Take 25 mg by mouth daily before breakfast. 90 tablet 3  . fexofenadine-pseudoephedrine (ALLEGRA-D 24) 180-240 MG 24 hr tablet Take 1 tablet by mouth daily.    . fluconazole (DIFLUCAN) 100 MG tablet Take 1 tablet (100 mg total) by mouth daily. 7 tablet 0  . fluticasone (FLONASE) 50 MCG/ACT nasal spray Place 2 sprays into both nostrils daily. 16 g 0  . ketoconazole (NIZORAL) 2 % cream Apply 1 application topically 2 (two) times daily. Use for vaginal area for 10 days 45 g 1  . losartan (COZAAR) 50 MG tablet Take 1 tablet (50 mg total) by mouth daily. 90 tablet 3  . metFORMIN (GLUCOPHAGE) 500 MG tablet TAKE 1 TABLET BY MOUTH TWICE DAILY WITH A MEAL . APPOINTMENT REQUIRED FOR FUTURE REFILLS 60 tablet 5  . nitroGLYCERIN (NITROSTAT) 0.4 MG SL tablet Place 0.4 mg under the tongue every 5 (five) minutes as needed for chest pain.    . pantoprazole (PROTONIX) 40 MG tablet Take 1 tablet (40 mg total) by mouth daily at 6 (six) AM. 30 tablet 3  . topiramate (TOPAMAX) 50 MG tablet TAKE 1 TAB BY MOUTH TWICE DAILY 180 tablet 0  . traMADol (ULTRAM) 50 MG tablet Take 1 tablet (50 mg total) by mouth every 6 (six) hours as needed. 15 tablet 0  . triamcinolone (NASACORT) 55 MCG/ACT AERO nasal inhaler Place 2 sprays into the nose daily. (Patient taking differently: Place 1-2 sprays into the nose daily as needed (NASAL CONGESTION). )    . Vitamin D, Ergocalciferol, (DRISDOL) 1.25 MG (50000 UT) CAPS capsule Take 1 capsule (50,000 Units total) by mouth every 7 (seven) days. 12  capsule 0   No current facility-administered medications on file prior to visit.     Observations/Objective: Alert, NAD, appropriate mood and affect, resps normal, cn 2-12 intact, moves all 4s, no visible rash or swelling Lab Results  Component Value Date   WBC 8.7 06/25/2019   HGB 14.3 06/25/2019   HCT 44.1 06/25/2019   PLT 264.0 06/25/2019   GLUCOSE 123 (H) 06/25/2019   CHOL 160 06/25/2019   TRIG 89.0 06/25/2019   HDL 61.40 06/25/2019   LDLCALC 81 06/25/2019   ALT 20 06/25/2019   AST 15 06/25/2019   NA 139 06/25/2019   K 4.4 06/25/2019   CL 106 06/25/2019   CREATININE 0.78 06/25/2019  BUN 19 06/25/2019   CO2 25 06/25/2019   TSH 0.89 06/25/2019   INR 1.08 03/29/2017   HGBA1C 7.9 (H) 09/07/2019   MICROALBUR <0.7 06/25/2019   Assessment and Plan: See notes  Follow Up Instructions: See notes   I discussed the assessment and treatment plan with the patient. The patient was provided an opportunity to ask questions and all were answered. The patient agreed with the plan and demonstrated an understanding of the instructions.   The patient was advised to call back or seek an in-person evaluation if the symptoms worsen or if the condition fails to improve as anticipated.  Cathlean Cower, MD

## 2019-11-16 DIAGNOSIS — Z6841 Body Mass Index (BMI) 40.0 and over, adult: Secondary | ICD-10-CM | POA: Diagnosis not present

## 2019-11-16 DIAGNOSIS — Z713 Dietary counseling and surveillance: Secondary | ICD-10-CM | POA: Diagnosis not present

## 2019-12-09 ENCOUNTER — Encounter: Payer: Self-pay | Admitting: Internal Medicine

## 2019-12-24 ENCOUNTER — Encounter: Payer: Self-pay | Admitting: Internal Medicine

## 2020-01-08 ENCOUNTER — Other Ambulatory Visit: Payer: Self-pay

## 2020-01-08 ENCOUNTER — Encounter: Payer: Self-pay | Admitting: Internal Medicine

## 2020-01-08 ENCOUNTER — Ambulatory Visit (INDEPENDENT_AMBULATORY_CARE_PROVIDER_SITE_OTHER): Payer: 59 | Admitting: Internal Medicine

## 2020-01-08 VITALS — BP 124/76 | HR 73 | Temp 98.1°F | Ht 64.0 in | Wt 228.5 lb

## 2020-01-08 DIAGNOSIS — E119 Type 2 diabetes mellitus without complications: Secondary | ICD-10-CM | POA: Diagnosis not present

## 2020-01-08 DIAGNOSIS — Z0000 Encounter for general adult medical examination without abnormal findings: Secondary | ICD-10-CM | POA: Diagnosis not present

## 2020-01-08 LAB — HEPATIC FUNCTION PANEL
ALT: 13 U/L (ref 0–35)
AST: 12 U/L (ref 0–37)
Albumin: 4 g/dL (ref 3.5–5.2)
Alkaline Phosphatase: 101 U/L (ref 39–117)
Bilirubin, Direct: 0.1 mg/dL (ref 0.0–0.3)
Total Bilirubin: 0.3 mg/dL (ref 0.2–1.2)
Total Protein: 7.1 g/dL (ref 6.0–8.3)

## 2020-01-08 LAB — MICROALBUMIN / CREATININE URINE RATIO
Creatinine,U: 85.6 mg/dL
Microalb Creat Ratio: 1.4 mg/g (ref 0.0–30.0)
Microalb, Ur: 1.2 mg/dL (ref 0.0–1.9)

## 2020-01-08 LAB — CBC WITH DIFFERENTIAL/PLATELET
Basophils Absolute: 0.1 10*3/uL (ref 0.0–0.1)
Basophils Relative: 1.3 % (ref 0.0–3.0)
Eosinophils Absolute: 0.3 10*3/uL (ref 0.0–0.7)
Eosinophils Relative: 3.8 % (ref 0.0–5.0)
HCT: 43.2 % (ref 36.0–46.0)
Hemoglobin: 14 g/dL (ref 12.0–15.0)
Lymphocytes Relative: 26.9 % (ref 12.0–46.0)
Lymphs Abs: 2 10*3/uL (ref 0.7–4.0)
MCHC: 32.5 g/dL (ref 30.0–36.0)
MCV: 84 fl (ref 78.0–100.0)
Monocytes Absolute: 0.5 10*3/uL (ref 0.1–1.0)
Monocytes Relative: 6.4 % (ref 3.0–12.0)
Neutro Abs: 4.6 10*3/uL (ref 1.4–7.7)
Neutrophils Relative %: 61.6 % (ref 43.0–77.0)
Platelets: 252 10*3/uL (ref 150.0–400.0)
RBC: 5.14 Mil/uL — ABNORMAL HIGH (ref 3.87–5.11)
RDW: 16.3 % — ABNORMAL HIGH (ref 11.5–15.5)
WBC: 7.4 10*3/uL (ref 4.0–10.5)

## 2020-01-08 LAB — LIPID PANEL
Cholesterol: 128 mg/dL (ref 0–200)
HDL: 51.2 mg/dL (ref >39.00)
LDL Cholesterol: 64 mg/dL (ref 0–99)
NonHDL: 77.2
Total CHOL/HDL Ratio: 3
Triglycerides: 64 mg/dL (ref 0.0–149.0)
VLDL: 12.8 mg/dL (ref 0.0–40.0)

## 2020-01-08 LAB — URINALYSIS, ROUTINE W REFLEX MICROSCOPIC
Bilirubin Urine: NEGATIVE
Hgb urine dipstick: NEGATIVE
Ketones, ur: NEGATIVE
Leukocytes,Ua: NEGATIVE
Nitrite: NEGATIVE
RBC / HPF: NONE SEEN (ref 0–?)
Specific Gravity, Urine: 1.02 (ref 1.000–1.030)
Total Protein, Urine: NEGATIVE
Urine Glucose: >=1000 — AB
Urobilinogen, UA: 0.2 (ref 0.0–1.0)
WBC, UA: NONE SEEN (ref 0–?)
pH: 5.5 (ref 5.0–8.0)

## 2020-01-08 LAB — BASIC METABOLIC PANEL
BUN: 19 mg/dL (ref 6–23)
CO2: 22 mEq/L (ref 19–32)
Calcium: 9.3 mg/dL (ref 8.4–10.5)
Chloride: 109 mEq/L (ref 96–112)
Creatinine, Ser: 0.77 mg/dL (ref 0.40–1.20)
GFR: 92.04 mL/min (ref >60.00)
Glucose, Bld: 105 mg/dL — ABNORMAL HIGH (ref 70–99)
Potassium: 3.9 mEq/L (ref 3.5–5.1)
Sodium: 139 mEq/L (ref 135–145)

## 2020-01-08 LAB — HEMOGLOBIN A1C: Hgb A1c MFr Bld: 7 % — ABNORMAL HIGH (ref 4.6–6.5)

## 2020-01-08 LAB — TSH: TSH: 1.02 u[IU]/mL (ref 0.35–4.50)

## 2020-01-08 NOTE — Patient Instructions (Signed)

## 2020-01-08 NOTE — Progress Notes (Signed)
Subjective:    Patient ID: Theresa Gibson, female    DOB: 1956-05-14, 64 y.o.   MRN: [id number]  HPI  Here for wellness and f/u;  Overall doing ok;  Pt denies Chest pain, worsening SOB, DOE, wheezing, orthopnea, PND, worsening LE edema, palpitations, dizziness or syncope.  Pt denies neurological change such as new headache, facial or extremity weakness.  Pt denies polydipsia, polyuria, or low sugar symptoms. Pt states overall good compliance with treatment and medications, good tolerability, and has been trying to follow appropriate diet.  Pt denies worsening depressive symptoms, suicidal ideation or panic. No fever, night sweats, wt loss, loss of appetite, or other constitutional symptoms.  Pt states good ability with ADL's, has low fall risk, home safety reviewed and adequate, no other significant changes in hearing or vision, and only occasionally active with exercise.  Now on trulicity for DM and wt loss per [facility].  Past Medical History:  Diagnosis Date  . Acute MI, inferior wall ([place]) 08/2016   s/p DES x 2 to the RCA  . Allergy   . Arthritis   . Depression   . GERD (gastroesophageal reflux disease)   . Hypertension   . Meningioma ([place])   . PONV (postoperative nausea and vomiting)    nausea only  . Wears dentures    upper   Past Surgical History:  Procedure Laterality Date  . ABDOMINAL HYSTERECTOMY     Total   . BREAST EXCISIONAL BIOPSY Left    approx 30+ years ago  . BREAST SURGERY     BX right breast  . COLONOSCOPY W/ BIOPSIES AND POLYPECTOMY    . CORONARY ANGIOPLASTY WITH STENT PLACEMENT    . KNEE SURGERY Bilateral    arthroscopy  . LAPAROSCOPIC APPENDECTOMY N/A 12/10/2015   Procedure: APPENDECTOMY LAPAROSCOPIC;  Surgeon: Erroll Luna, MD;  Location: [facility];  Service: General;  Laterality: N/A;  . MULTIPLE TOOTH EXTRACTIONS    . NASAL/SINUS ENDOSCOPY Bilateral 03/29/2017   ENDOSCOPIC SINUS SURGERY WITH NAVIGATION (Bilateral)  . SINUS ENDO W/FUSION Bilateral  03/29/2017   Procedure: ENDOSCOPIC SINUS SURGERY WITH NAVIGATION;  Surgeon: Melissa Montane, MD;  Location: [facility];  Service: ENT;  Laterality: Bilateral;  . TUBAL LIGATION    . TURBINATE REDUCTION Bilateral 03/29/2017   Procedure: TURBINATE REDUCTION;  Surgeon: Melissa Montane, MD;  Location: [facility];  Service: ENT;  Laterality: Bilateral;    reports that she quit smoking about 2 years ago. Her smoking use included cigarettes. She has a 21.00 pack-year smoking history. She has never used smokeless tobacco. She reports current alcohol use. She reports that she does not use drugs. family history includes Arthritis in her mother; Heart attack in her father; Hypertension in her mother; Ovarian cancer in her maternal grandmother. Allergies  Allergen Reactions  . Nicotine     Patch...caused a rash  . Ace Inhibitors Cough    REACTION: Dry cough-lisinopril    Current Outpatient Medications on File Prior to Visit  Medication Sig Dispense Refill  . Calcium Carbonate-Vitamin D 600-200 MG-UNIT TABS Take by mouth.    . Dulaglutide 0.75 MG/0.5ML SOPN Inject into the skin.    Marland Kitchen acetaminophen (TYLENOL) 500 MG tablet Take 2 tablets (1,000 mg total) by mouth every 8 (eight) hours as needed for moderate pain. 30 tablet 0  . aspirin EC 81 MG tablet Take 81 mg by mouth daily.    Marland Kitchen atorvastatin (LIPITOR) 40 MG tablet Take 40 mg by mouth daily at 6  PM.     . azithromycin (ZITHROMAX) 250 MG tablet 2 tab by mouth day 1, then 1 per day 6 tablet 0  . blood glucose meter kit and supplies KIT Dispense based on patient and insurance preference. Use up to four times daily as directed. DX CODE: E11.9 1 each 0  . blood glucose meter kit and supplies KIT Dispense based on patient and insurance preference. Use up to four times daily as directed. Dx Code: E11.9 1 each 0  . buPROPion (WELLBUTRIN SR) 150 MG 12 hr tablet TAKE 1 TABLET BY MOUTH TWICE DAILY NEED  TO  ESTABLISH  WITH  NEW  PROVIDER 180 tablet 0  . carvedilol (COREG) 3.125 MG  tablet Take 3.125 mg by mouth 2 (two) times daily with a meal.    . cetirizine (ZYRTEC) 10 MG tablet Take 10 mg by mouth daily as needed for allergies.    . cholecalciferol (VITAMIN D) 1000 units tablet Take 1,000 Units by mouth daily.    . cyanocobalamin 1000 MCG tablet Take by mouth.    . diclofenac (VOLTAREN) 25 MG EC tablet Take 1 tablet (25 mg total) by mouth 2 (two) times daily. 14 tablet 0  . diclofenac sodium (VOLTAREN) 1 % GEL Apply 4 g topically 4 (four) times daily. 100 g 1  . empagliflozin (JARDIANCE) 25 MG TABS tablet Take 25 mg by mouth daily before breakfast. 90 tablet 3  . fexofenadine-pseudoephedrine (ALLEGRA-D 24) 180-240 MG 24 hr tablet Take 1 tablet by mouth daily.    . fluconazole (DIFLUCAN) 100 MG tablet Take 1 tablet (100 mg total) by mouth daily. 7 tablet 0  . fluticasone (FLONASE) 50 MCG/ACT nasal spray Place 2 sprays into both nostrils daily. 16 g 0  . ketoconazole (NIZORAL) 2 % cream Apply 1 application topically 2 (two) times daily. Use for vaginal area for 10 days 45 g 1  . losartan (COZAAR) 50 MG tablet Take 1 tablet (50 mg total) by mouth daily. 90 tablet 3  . metFORMIN (GLUCOPHAGE) 500 MG tablet TAKE 1 TABLET BY MOUTH TWICE DAILY WITH A MEAL . APPOINTMENT REQUIRED FOR FUTURE REFILLS 60 tablet 5  . naproxen (NAPROSYN) 500 MG tablet naproxen 500 mg tablet  TAKE 1 TABLET BY MOUTH TWICE DAILY WITH MEALS FOR 30 DAYS    . nitroGLYCERIN (NITROSTAT) 0.4 MG SL tablet Place 0.4 mg under the tongue every 5 (five) minutes as needed for chest pain.    . pantoprazole (PROTONIX) 20 MG tablet Take 40 mg by mouth daily.    . pantoprazole (PROTONIX) 40 MG tablet Take 1 tablet (40 mg total) by mouth daily at 6 (six) AM. 30 tablet 3  . predniSONE (DELTASONE) 10 MG tablet 3 tabs by mouth per day for 3 days,2tabs per day for 3 days,1tab per day for 3 days 18 tablet 0  . topiramate (TOPAMAX) 50 MG tablet TAKE 1 TAB BY MOUTH TWICE DAILY 180 tablet 0  . traMADol (ULTRAM) 50 MG tablet Take  1 tablet (50 mg total) by mouth every 6 (six) hours as needed. 15 tablet 0  . triamcinolone (NASACORT) 55 MCG/ACT AERO nasal inhaler Place 2 sprays into the nose daily. (Patient taking differently: Place 1-2 sprays into the nose daily as needed (NASAL CONGESTION). )    . TRULICITY 7.53 YY/5.1TM SOPN     . Vitamin D, Ergocalciferol, (DRISDOL) 1.25 MG (50000 UT) CAPS capsule Take 1 capsule (50,000 Units total) by mouth every 7 (seven) days. 12 capsule 0  No current facility-administered medications on file prior to visit.    Review of Systems All otherwise neg per pt     Objective:   Physical Exam BP 124/76 (BP Location: Left Arm, Patient Position: Sitting, Cuff Size: Large)   Pulse 73   Temp 98.1 F (36.7 C) (Oral)   Ht '5\' 4"'  (1.626 m)   Wt 228 lb 8 oz (103.6 kg)   SpO2 97%   BMI 39.22 kg/m  VS noted,  Constitutional: Pt appears in NAD HENT: Head: NCAT.  Right Ear: External ear normal.  Left Ear: External ear normal.  Eyes: . Pupils are equal, round, and reactive to light. Conjunctivae and EOM are normal Nose: without d/c or deformity Neck: Neck supple. Gross normal ROM Cardiovascular: Normal rate and regular rhythm.   Pulmonary/Chest: Effort normal and breath sounds without rales or wheezing.  Abd:  Soft, NT, ND, + BS, no organomegaly Neurological: Pt is alert. At baseline orientation, motor grossly intact Skin: Skin is warm. No rashes, other new lesions, no LE edema Psychiatric: Pt behavior is normal without agitation  All otherwise neg per pt Lab Results  Component Value Date   WBC 7.4 01/08/2020   HGB 14.0 01/08/2020   HCT 43.2 01/08/2020   PLT 252.0 01/08/2020   GLUCOSE 105 (H) 01/08/2020   CHOL 128 01/08/2020   TRIG 64.0 01/08/2020   HDL 51.20 01/08/2020   LDLCALC 64 01/08/2020   ALT 13 01/08/2020   AST 12 01/08/2020   NA 139 01/08/2020   K 3.9 01/08/2020   CL 109 01/08/2020   CREATININE 0.77 01/08/2020   BUN 19 01/08/2020   CO2 22 01/08/2020   TSH 1.02  01/08/2020   INR 1.08 03/29/2017   HGBA1C 7.0 (H) 01/08/2020   MICROALBUR 1.2 01/08/2020      Assessment & Plan:

## 2020-01-10 ENCOUNTER — Encounter: Payer: Self-pay | Admitting: Internal Medicine

## 2020-01-10 NOTE — Assessment & Plan Note (Signed)

## 2020-01-10 NOTE — Assessment & Plan Note (Signed)
stable overall by history and exam, recent data reviewed with pt, and pt to continue medical treatment as before,  to f/u any worsening symptoms or concerns  

## 2020-01-18 ENCOUNTER — Encounter: Payer: Self-pay | Admitting: Internal Medicine

## 2020-01-22 ENCOUNTER — Encounter: Payer: Self-pay | Admitting: Internal Medicine

## 2020-01-22 MED ORDER — ATORVASTATIN CALCIUM 40 MG PO TABS: 40.0000 mg | ORAL_TABLET | Freq: Every day | ORAL | 3 refills | Status: DC

## 2020-02-16 ENCOUNTER — Encounter: Payer: Self-pay | Admitting: Internal Medicine

## 2020-02-16 MED ORDER — NAPROXEN 500 MG PO TABS: ORAL_TABLET | ORAL | 3 refills | Status: DC

## 2020-03-04 ENCOUNTER — Other Ambulatory Visit: Payer: Self-pay | Admitting: Internal Medicine

## 2020-04-15 ENCOUNTER — Encounter: Payer: Self-pay | Admitting: Internal Medicine

## 2020-04-15 MED ORDER — ACCU-CHEK AVIVA PLUS W/DEVICE KIT: PACK | 0 refills | Status: DC

## 2020-04-15 MED ORDER — ACCU-CHEK SOFTCLIX LANCETS MISC: 3 refills | Status: DC

## 2020-04-15 MED ORDER — ACCU-CHEK AVIVA PLUS VI STRP: ORAL_STRIP | 3 refills | Status: DC

## 2020-04-19 ENCOUNTER — Telehealth (INDEPENDENT_AMBULATORY_CARE_PROVIDER_SITE_OTHER): Payer: 59 | Admitting: Internal Medicine

## 2020-04-19 ENCOUNTER — Encounter: Payer: Self-pay | Admitting: Internal Medicine

## 2020-04-19 DIAGNOSIS — E119 Type 2 diabetes mellitus without complications: Secondary | ICD-10-CM | POA: Diagnosis not present

## 2020-04-19 DIAGNOSIS — J329 Chronic sinusitis, unspecified: Secondary | ICD-10-CM

## 2020-04-19 DIAGNOSIS — J309 Allergic rhinitis, unspecified: Secondary | ICD-10-CM

## 2020-04-19 MED ORDER — DOXYCYCLINE HYCLATE 100 MG PO TABS: 100.0000 mg | ORAL_TABLET | Freq: Two times a day (BID) | ORAL | 0 refills | Status: DC

## 2020-04-19 MED ORDER — HYDROCODONE-HOMATROPINE 5-1.5 MG/5ML PO SYRP: 5.0000 mL | ORAL_SOLUTION | Freq: Four times a day (QID) | ORAL | 0 refills | Status: AC | PRN

## 2020-04-19 MED ORDER — PREDNISONE 10 MG PO TABS: ORAL_TABLET | ORAL | 0 refills | Status: DC

## 2020-04-19 NOTE — Progress Notes (Signed)
Patient ID: Theresa Gibson, female   DOB: [birth year], 64 y.o.   MRN: [id number]  Virtual Visit via Video Note  I connected with [name] on 04/19/20 at  3:40 PM EDT by a video enabled telemedicine application and verified that I am speaking with the correct person using two identifiers.  Location: of al participants today Patient: at home Provider: at office   I discussed the limitations of evaluation and management by telemedicine and the availability of in person appointments. The patient expressed understanding and agreed to proceed.  History of Present Illness:  Here with 2-3 days acute onset fever, facial pain, pressure, headache, general weakness and malaise, and greenish d/c, with mild ST and cough, but pt denies chest pain, wheezing, increased sob or doe, orthopnea, PND, increased LE swelling, palpitations, dizziness or syncope.  Does have several wks ongoing nasal allergy symptoms with clearish congestion, itch and sneezing, without fever, pain, ST, cough, swelling or wheezing.  S/p covid vaccination.   Pt denies polydipsia, polyuria Past Medical History:  Diagnosis Date  . Acute MI, inferior wall ([place]) 08/2016   s/p DES x 2 to the RCA  . Allergy   . Arthritis   . Depression   . GERD (gastroesophageal reflux disease)   . Hypertension   . Meningioma ([place])   . PONV (postoperative nausea and vomiting)    nausea only  . Wears dentures    upper   Past Surgical History:  Procedure Laterality Date  . ABDOMINAL HYSTERECTOMY     Total   . BREAST EXCISIONAL BIOPSY Left    approx 30+ years ago  . BREAST SURGERY     BX right breast  . COLONOSCOPY W/ BIOPSIES AND POLYPECTOMY    . CORONARY ANGIOPLASTY WITH STENT PLACEMENT    . KNEE SURGERY Bilateral    arthroscopy  . LAPAROSCOPIC APPENDECTOMY N/A 12/10/2015   Procedure: APPENDECTOMY LAPAROSCOPIC;  Surgeon: Erroll Luna, MD;  Location: [facility];  Service: General;  Laterality: N/A;  . MULTIPLE TOOTH EXTRACTIONS    .  NASAL/SINUS ENDOSCOPY Bilateral 03/29/2017   ENDOSCOPIC SINUS SURGERY WITH NAVIGATION (Bilateral)  . SINUS ENDO W/FUSION Bilateral 03/29/2017   Procedure: ENDOSCOPIC SINUS SURGERY WITH NAVIGATION;  Surgeon: Melissa Montane, MD;  Location: [facility];  Service: ENT;  Laterality: Bilateral;  . TUBAL LIGATION    . TURBINATE REDUCTION Bilateral 03/29/2017   Procedure: TURBINATE REDUCTION;  Surgeon: Melissa Montane, MD;  Location: [facility];  Service: ENT;  Laterality: Bilateral;    reports that she quit smoking about 2 years ago. Her smoking use included cigarettes. She has a 21.00 pack-year smoking history. She has never used smokeless tobacco. She reports current alcohol use. She reports that she does not use drugs. family history includes Arthritis in her mother; Heart attack in her father; Hypertension in her mother; Ovarian cancer in her maternal grandmother. Allergies  Allergen Reactions  . Nicotine     Patch...caused a rash  . Ace Inhibitors Cough    REACTION: Dry cough-lisinopril    Current Outpatient Medications on File Prior to Visit  Medication Sig Dispense Refill  . Accu-Chek Softclix Lancets lancets Use to check blood sugars twice a day 100 each 3  . acetaminophen (TYLENOL) 500 MG tablet Take 2 tablets (1,000 mg total) by mouth every 8 (eight) hours as needed for moderate pain. 30 tablet 0  . aspirin EC 81 MG tablet Take 81 mg by mouth daily.    Marland Kitchen atorvastatin (LIPITOR) 40 MG tablet Take 1  tablet (40 mg total) by mouth daily at 6 PM. 90 tablet 3  . azithromycin (ZITHROMAX) 250 MG tablet 2 tab by mouth day 1, then 1 per day 6 tablet 0  . blood glucose meter kit and supplies KIT Dispense based on patient and insurance preference. Use up to four times daily as directed. DX CODE: E11.9 1 each 0  . blood glucose meter kit and supplies KIT Dispense based on patient and insurance preference. Use up to four times daily as directed. Dx Code: E11.9 1 each 0  . Blood Glucose Monitoring Suppl (ACCU-CHEK AVIVA  PLUS) w/Device KIT Use as directed to check blood sugars 1 kit 0  . buPROPion (WELLBUTRIN SR) 150 MG 12 hr tablet TAKE 1 TABLET BY MOUTH TWICE DAILY NEED  TO  ESTABLISH  WITH  NEW  PROVIDER 180 tablet 0  . Calcium Carbonate-Vitamin D 600-200 MG-UNIT TABS Take by mouth.    . carvedilol (COREG) 3.125 MG tablet Take 3.125 mg by mouth 2 (two) times daily with a meal.    . cetirizine (ZYRTEC) 10 MG tablet Take 10 mg by mouth daily as needed for allergies.    . cholecalciferol (VITAMIN D) 1000 units tablet Take 1,000 Units by mouth daily.    . cyanocobalamin 1000 MCG tablet Take by mouth.    . diclofenac sodium (VOLTAREN) 1 % GEL Apply 4 g topically 4 (four) times daily. 100 g 1  . Dulaglutide 0.75 MG/0.5ML SOPN Inject into the skin.    Marland Kitchen empagliflozin (JARDIANCE) 25 MG TABS tablet Take 25 mg by mouth daily before breakfast. 90 tablet 3  . fexofenadine-pseudoephedrine (ALLEGRA-D 24) 180-240 MG 24 hr tablet Take 1 tablet by mouth daily.    . fluconazole (DIFLUCAN) 100 MG tablet Take 1 tablet (100 mg total) by mouth daily. 7 tablet 0  . fluticasone (FLONASE) 50 MCG/ACT nasal spray Place 2 sprays into both nostrils daily. 16 g 0  . glucose blood (ACCU-CHEK AVIVA PLUS) test strip Use to check blood sugars twice a day 100 each 3  . ketoconazole (NIZORAL) 2 % cream APPLY  CREAM TOPICALLY TO VAGINAL AREA TWICE DAILY FOR 10 DAYS 45 g 0  . losartan (COZAAR) 50 MG tablet Take 1 tablet (50 mg total) by mouth daily. 90 tablet 3  . metFORMIN (GLUCOPHAGE) 500 MG tablet TAKE 1 TABLET BY MOUTH TWICE DAILY WITH A MEAL . APPOINTMENT REQUIRED FOR FUTURE REFILLS 60 tablet 5  . naproxen (NAPROSYN) 500 MG tablet TAKE 1 TABLET BY MOUTH TWICE DAILY WITH MEALS as needed 60 tablet 3  . nitroGLYCERIN (NITROSTAT) 0.4 MG SL tablet Place 0.4 mg under the tongue every 5 (five) minutes as needed for chest pain.    . pantoprazole (PROTONIX) 20 MG tablet Take 40 mg by mouth daily.    . pantoprazole (PROTONIX) 40 MG tablet Take 1 tablet  (40 mg total) by mouth daily at 6 (six) AM. 30 tablet 3  . topiramate (TOPAMAX) 50 MG tablet TAKE 1 TAB BY MOUTH TWICE DAILY 180 tablet 0  . traMADol (ULTRAM) 50 MG tablet Take 1 tablet (50 mg total) by mouth every 6 (six) hours as needed. 15 tablet 0  . triamcinolone (NASACORT) 55 MCG/ACT AERO nasal inhaler Place 2 sprays into the nose daily. (Patient taking differently: Place 1-2 sprays into the nose daily as needed (NASAL CONGESTION). )    . TRULICITY 0.35 WS/5.6CL SOPN     . Vitamin D, Ergocalciferol, (DRISDOL) 1.25 MG (50000 UT) CAPS capsule Take 1  capsule (50,000 Units total) by mouth every 7 (seven) days. 12 capsule 0   No current facility-administered medications on file prior to visit.    Observations/Objective: Alert, NAD, appropriate mood and affect, resps normal, cn 2-12 intact, moves all 4s, no visible rash or swelling Lab Results  Component Value Date   WBC 7.4 01/08/2020   HGB 14.0 01/08/2020   HCT 43.2 01/08/2020   PLT 252.0 01/08/2020   GLUCOSE 105 (H) 01/08/2020   CHOL 128 01/08/2020   TRIG 64.0 01/08/2020   HDL 51.20 01/08/2020   LDLCALC 64 01/08/2020   ALT 13 01/08/2020   AST 12 01/08/2020   NA 139 01/08/2020   K 3.9 01/08/2020   CL 109 01/08/2020   CREATININE 0.77 01/08/2020   BUN 19 01/08/2020   CO2 22 01/08/2020   TSH 1.02 01/08/2020   INR 1.08 03/29/2017   HGBA1C 7.0 (H) 01/08/2020   MICROALBUR 1.2 01/08/2020   Assessment and Plan: See notes  Follow Up Instructions: See notes   I discussed the assessment and treatment plan with the patient. The patient was provided an opportunity to ask questions and all were answered. The patient agreed with the plan and demonstrated an understanding of the instructions.   The patient was advised to call back or seek an in-person evaluation if the symptoms worsen or if the condition fails to improve as anticipated.   Cathlean Cower, MD

## 2020-04-19 NOTE — Assessment & Plan Note (Addendum)
Mild to mod, for antibx course,  to f/u any worsening symptoms or concerns  I spent 31 minutes in preparing to see the patient by review of recent labs, imaging and procedures, obtaining and reviewing separately obtained history, communicating with the patient and family or caregiver, ordering medications, tests or procedures, and documenting clinical information in the EHR including the differential Dx, treatment, and any further evaluation and other management of sinus infection, allergies, DM

## 2020-04-19 NOTE — Assessment & Plan Note (Signed)
stable overall by history and exam, recent data reviewed with pt, and pt to continue medical treatment as before,  to f/u any worsening symptoms or concerns  

## 2020-04-19 NOTE — Patient Instructions (Signed)
Please take all new medication as prescribed 

## 2020-04-19 NOTE — Assessment & Plan Note (Signed)
/  Mild to mod, for predpac asd,  to f/u any worsening symptoms or concerns 

## 2020-04-28 MED ORDER — ACCU-CHEK GUIDE VI STRP: ORAL_STRIP | 12 refills | Status: DC

## 2020-04-28 MED ORDER — LANCETS MISC: 3 refills | Status: DC

## 2020-04-28 MED ORDER — ACCU-CHEK GUIDE ME W/DEVICE KIT: PACK | 0 refills | Status: DC

## 2020-04-28 NOTE — Addendum Note (Signed)
Addended by: Biagio Borg on: 04/28/2020 06:06 PM   Modules accepted: Orders

## 2020-04-28 NOTE — Telephone Encounter (Signed)
Ok this is done hardcopy to [employer]

## 2020-05-08 ENCOUNTER — Emergency Department (HOSPITAL_BASED_OUTPATIENT_CLINIC_OR_DEPARTMENT_OTHER)
Admission: EM | Admit: 2020-05-08 | Discharge: 2020-05-08 | Disposition: A | Discharge status: Home / Self Care | Payer: 59 | Attending: Emergency Medicine | Admitting: Emergency Medicine

## 2020-05-08 ENCOUNTER — Other Ambulatory Visit: Payer: Self-pay

## 2020-05-08 ENCOUNTER — Encounter (HOSPITAL_BASED_OUTPATIENT_CLINIC_OR_DEPARTMENT_OTHER): Payer: Self-pay | Admitting: Emergency Medicine

## 2020-05-08 ENCOUNTER — Emergency Department (HOSPITAL_BASED_OUTPATIENT_CLINIC_OR_DEPARTMENT_OTHER): Payer: 59

## 2020-05-08 DIAGNOSIS — Z7982 Long term (current) use of aspirin: Secondary | ICD-10-CM | POA: Insufficient documentation

## 2020-05-08 DIAGNOSIS — J40 Bronchitis, not specified as acute or chronic: Secondary | ICD-10-CM

## 2020-05-08 DIAGNOSIS — I252 Old myocardial infarction: Secondary | ICD-10-CM | POA: Insufficient documentation

## 2020-05-08 DIAGNOSIS — R079 Chest pain, unspecified: Secondary | ICD-10-CM | POA: Diagnosis not present

## 2020-05-08 DIAGNOSIS — Z955 Presence of coronary angioplasty implant and graft: Secondary | ICD-10-CM | POA: Diagnosis not present

## 2020-05-08 DIAGNOSIS — Z87891 Personal history of nicotine dependence: Secondary | ICD-10-CM | POA: Diagnosis not present

## 2020-05-08 DIAGNOSIS — E119 Type 2 diabetes mellitus without complications: Secondary | ICD-10-CM | POA: Diagnosis not present

## 2020-05-08 DIAGNOSIS — I10 Essential (primary) hypertension: Secondary | ICD-10-CM | POA: Diagnosis not present

## 2020-05-08 DIAGNOSIS — Z79899 Other long term (current) drug therapy: Secondary | ICD-10-CM | POA: Insufficient documentation

## 2020-05-08 DIAGNOSIS — R0981 Nasal congestion: Secondary | ICD-10-CM | POA: Diagnosis not present

## 2020-05-08 DIAGNOSIS — Z86011 Personal history of benign neoplasm of the brain: Secondary | ICD-10-CM | POA: Insufficient documentation

## 2020-05-08 DIAGNOSIS — Z7984 Long term (current) use of oral hypoglycemic drugs: Secondary | ICD-10-CM | POA: Diagnosis not present

## 2020-05-08 DIAGNOSIS — R05 Cough: Secondary | ICD-10-CM | POA: Diagnosis present

## 2020-05-08 MED ORDER — AZITHROMYCIN 250 MG PO TABS
250.0000 mg | ORAL_TABLET | Freq: Every day | ORAL | 0 refills | Status: DC
Start: 2020-05-08 — End: 2020-06-28

## 2020-05-08 MED ORDER — ALBUTEROL SULFATE HFA 108 (90 BASE) MCG/ACT IN AERS
4.0000 | INHALATION_SPRAY | RESPIRATORY_TRACT | Status: AC
  Administered 2020-05-08: 4 via RESPIRATORY_TRACT
  Filled 2020-05-08: qty 6.7

## 2020-05-08 MED ORDER — BENZONATATE 200 MG PO CAPS
200.0000 mg | ORAL_CAPSULE | Freq: Three times a day (TID) | ORAL | 0 refills | Status: DC | PRN
Start: 2020-05-08 — End: 2020-06-28

## 2020-05-08 NOTE — ED Triage Notes (Signed)
  Patient comes in with chest pain and cough that has been going on for about 2 weeks.  Patient states she was treated for an URI 2 weeks ago and given steroids and antibiotics.  Patient states she felt better and then became symptomatic after the medications were finished.  Patient insists the pain is from excessive coughing and not heart related.  Afebrile and no sick contacts.  Fully vaccinated.    Hx of hiatal hernia, GERD, and heart attack in 2017.

## 2020-05-08 NOTE — Discharge Instructions (Signed)
Use the inhaler every 4 hours if needed for sensation of shortness of breath or coughing.

## 2020-05-08 NOTE — ED Provider Notes (Signed)
[facility] EMERGENCY DEPARTMENT Provider Note   CSN: [id number] Arrival date & time: 05/08/20  5277     History Chief Complaint  Patient presents with  . Cough  . Chest Pain    Theresa Gibson is a 64 y.o. female.  Patient presents to the emergency department for evaluation of cough.  Patient reports that she has been sick for more than 2 weeks.  She started with a cough and chest congestion.  Her doctor put her on a course of prednisone and doxycycline.  She initially had some improvement but when the steroids wore off she started coughing again.  Patient reports a lot of white sputum production.  She has been coughing so much that now she has some soreness across her anterior ribs.        Past Medical History:  Diagnosis Date  . Acute MI, inferior wall ([place]) 08/2016   s/p DES x 2 to the RCA  . Allergy   . Arthritis   . Depression   . GERD (gastroesophageal reflux disease)   . Hypertension   . Meningioma ([place])   . PONV (postoperative nausea and vomiting)    nausea only  . Wears dentures    upper    Patient Active Problem List   Diagnosis Date Noted  . Sinus infection 11/09/2019  . Preventative health care 06/25/2019  . Vaginitis 03/24/2019  . Sensorineural hearing loss (SNHL), bilateral 11/06/2018  . Tinnitus, bilateral 11/06/2018  . Chronic pain of both knees 07/28/2018  . Presence of coronary angioplasty implant and graft 06/03/2018  . Diabetes mellitus without complication ([place]) 82/42/3536  . Chronic sinusitis 03/29/2017  . Atherosclerotic heart disease of native coronary artery without angina pectoris 03/06/2017  . S/P drug eluting coronary stent placement 03/06/2017  . Situational anxiety 11/19/2016  . Myocardial infarction ([place]) 11/19/2016  . Vitamin D deficiency 09/13/2015  . Generalized headaches 04/21/2015  . Osteoarthritis 04/21/2015  . Decreased libido 09/21/2014  . CARPAL TUNNEL SYNDROME, BILATERAL 12/03/2007  . Hypersomnia with sleep  apnea 06/24/2007  . CAVERNOUS HEMANGIOMA 04/18/2007  . Hyperlipidemia with target LDL less than 70 12/10/2006  . Obesity 12/10/2006  . Anxiety and depression 12/10/2006  . Essential hypertension 12/10/2006  . Allergic rhinitis 12/10/2006  . GERD 12/10/2006    Past Surgical History:  Procedure Laterality Date  . ABDOMINAL HYSTERECTOMY     Total   . BREAST EXCISIONAL BIOPSY Left    approx 30+ years ago  . BREAST SURGERY     BX right breast  . COLONOSCOPY W/ BIOPSIES AND POLYPECTOMY    . CORONARY ANGIOPLASTY WITH STENT PLACEMENT    . KNEE SURGERY Bilateral    arthroscopy  . LAPAROSCOPIC APPENDECTOMY N/A 12/10/2015   Procedure: APPENDECTOMY LAPAROSCOPIC;  Surgeon: Erroll Luna, MD;  Location: [facility];  Service: General;  Laterality: N/A;  . MULTIPLE TOOTH EXTRACTIONS    . NASAL/SINUS ENDOSCOPY Bilateral 03/29/2017   ENDOSCOPIC SINUS SURGERY WITH NAVIGATION (Bilateral)  . SINUS ENDO W/FUSION Bilateral 03/29/2017   Procedure: ENDOSCOPIC SINUS SURGERY WITH NAVIGATION;  Surgeon: Melissa Montane, MD;  Location: [facility];  Service: ENT;  Laterality: Bilateral;  . TUBAL LIGATION    . TURBINATE REDUCTION Bilateral 03/29/2017   Procedure: TURBINATE REDUCTION;  Surgeon: Melissa Montane, MD;  Location: [facility];  Service: ENT;  Laterality: Bilateral;     OB History   No obstetric history on file.     Family History  Problem Relation Age of Onset  . Hypertension Mother   .  Arthritis Mother   . Heart attack Father        In his 80's  . Ovarian cancer Maternal Grandmother   . Colon cancer Neg Hx   . Colon polyps Neg Hx   . Rectal cancer Neg Hx   . Stomach cancer Neg Hx   . Breast cancer Neg Hx     Social History   Tobacco Use  . Smoking status: Former Smoker    Packs/day: 0.50    Years: 42.00    Pack years: 21.00    Types: Cigarettes    Quit date: 01/03/2018    Years since quitting: 2.3  . Smokeless tobacco: Never Used  Substance Use Topics  . Alcohol use: Yes    Alcohol/week: 0.0 standard  drinks    Comment: occ  . Drug use: No    Home Medications Prior to Admission medications   Medication Sig Start Date End Date Taking? Authorizing Provider  acetaminophen (TYLENOL) 500 MG tablet Take 2 tablets (1,000 mg total) by mouth every 8 (eight) hours as needed for moderate pain. 09/30/17   Nche, Charlene Brooke, NP  aspirin EC 81 MG tablet Take 81 mg by mouth daily.    [provider]  atorvastatin (LIPITOR) 40 MG tablet Take 1 tablet (40 mg total) by mouth daily at 6 PM. 01/22/20   Biagio Borg, MD  azithromycin (ZITHROMAX) 250 MG tablet Take 1 tablet (250 mg total) by mouth daily. Take first 2 tablets together, then 1 every day until finished. 05/08/20   Orpah Greek, MD  benzonatate (TESSALON) 200 MG capsule Take 1 capsule (200 mg total) by mouth 3 (three) times daily as needed for cough. 05/08/20   Orpah Greek, MD  Blood Glucose Monitoring Suppl (ACCU-CHEK GUIDE ME) w/Device KIT Use as directed up to four times daily E11.9 04/28/20   Biagio Borg, MD  buPROPion [facility] SR) 150 MG 12 hr tablet TAKE 1 TABLET BY MOUTH TWICE DAILY NEED  TO  ESTABLISH  WITH  NEW  PROVIDER 10/02/19   Biagio Borg, MD  Calcium Carbonate-Vitamin D 600-200 MG-UNIT TABS Take by mouth. 03/24/19   [provider]  carvedilol (COREG) 3.125 MG tablet Take 3.125 mg by mouth 2 (two) times daily with a meal.    [provider]  cetirizine (ZYRTEC) 10 MG tablet Take 10 mg by mouth daily as needed for allergies.    [provider]  cholecalciferol (VITAMIN D) 1000 units tablet Take 1,000 Units by mouth daily.    [provider]  cyanocobalamin 1000 MCG tablet Take by mouth.    [provider]  diclofenac sodium (VOLTAREN) 1 % GEL Apply 4 g topically 4 (four) times daily. 07/28/18   Lance Sell, NP  doxycycline (VIBRA-TABS) 100 MG tablet Take 1 tablet (100 mg total) by mouth 2 (two) times daily. 04/19/20   Biagio Borg, MD  Dulaglutide 0.75  MG/0.5ML SOPN Inject into the skin. 12/01/19   [provider]  empagliflozin (JARDIANCE) 25 MG TABS tablet Take 25 mg by mouth daily before breakfast. 09/07/19   Biagio Borg, MD  fexofenadine-pseudoephedrine (ALLEGRA-D 24) 180-240 MG 24 hr tablet Take 1 tablet by mouth daily.    [provider]  fluconazole (DIFLUCAN) 100 MG tablet Take 1 tablet (100 mg total) by mouth daily. 06/25/19   Biagio Borg, MD  fluticasone (FLONASE) 50 MCG/ACT nasal spray Place 2 sprays into both nostrils daily. 12/20/17   Palumbo, April, MD  glucose blood (ACCU-CHEK GUIDE) test strip Use as instructed once daily E11.9 04/28/20   Biagio Borg, MD  ketoconazole (NIZORAL) 2 % cream APPLY  CREAM TOPICALLY TO VAGINAL AREA TWICE DAILY FOR 10 DAYS 03/07/20   Plotnikov, Evie Lacks, MD  Lancets MISC Use as directed once daily E11.9 04/28/20   Biagio Borg, MD  losartan (COZAAR) 50 MG tablet Take 1 tablet (50 mg total) by mouth daily. 07/28/18   Lance Sell, NP  metFORMIN (GLUCOPHAGE) 500 MG tablet TAKE 1 TABLET BY MOUTH TWICE DAILY WITH A MEAL . APPOINTMENT REQUIRED FOR FUTURE REFILLS 09/30/19   Biagio Borg, MD  naproxen (NAPROSYN) 500 MG tablet TAKE 1 TABLET BY MOUTH TWICE DAILY WITH MEALS as needed 02/16/20   Biagio Borg, MD  nitroGLYCERIN (NITROSTAT) 0.4 MG SL tablet Place 0.4 mg under the tongue every 5 (five) minutes as needed for chest pain.    [provider]  pantoprazole (PROTONIX) 20 MG tablet Take 40 mg by mouth daily. 10/22/19   [provider]  pantoprazole (PROTONIX) 40 MG tablet Take 1 tablet (40 mg total) by mouth daily at 6 (six) AM. 03/02/17   Bhagat, Bhavinkumar, PA  predniSONE (DELTASONE) 10 MG tablet 3 tabs by mouth per day for 3 days,2tabs per day for 3 days,1tab per day for 3 days 04/19/20   Biagio Borg, MD  topiramate (TOPAMAX) 50 MG tablet TAKE 1 TAB BY MOUTH TWICE DAILY 08/22/17   Golden Circle, FNP  traMADol (ULTRAM) 50 MG tablet Take 1 tablet (50 mg total)  by mouth every 6 (six) hours as needed. 06/26/18   Ashley Murrain, NP  triamcinolone (NASACORT) 55 MCG/ACT AERO nasal inhaler Place 2 sprays into the nose daily. Patient taking differently: Place 1-2 sprays into the nose daily as needed (NASAL CONGESTION).  01/04/17   Molpus, John, MD  TRULICITY 8.83 GP/4.9IY [facility]  12/25/19   [provider]  Vitamin D, Ergocalciferol, (DRISDOL) 1.25 MG (50000 UT) CAPS capsule Take 1 capsule (50,000 Units total) by mouth every 7 (seven) days. 06/25/19   Biagio Borg, MD    Allergies    Nicotine and Ace inhibitors  Review of Systems   Review of Systems  Respiratory: Positive for cough.   All other systems reviewed and are negative.   Physical Exam Updated Vital Signs BP 132/75 (BP Location: Right Arm)   Pulse 68   Temp 97.8 F (36.6 C) (Oral)   Resp 16   Ht _0  (1.626 m)   Wt 101.2 kg   SpO2 100%   BMI 38.28 kg/m   Physical Exam Vitals and nursing note reviewed.  Constitutional:      General: She is not in acute distress.    Appearance: Normal appearance. She is well-developed.  HENT:     Head: Normocephalic and atraumatic.     Right Ear: Hearing normal.     Left Ear: Hearing normal.     Nose: Nose normal.  Eyes:     Conjunctiva/sclera: Conjunctivae normal.     Pupils: Pupils are equal, round, and reactive to light.  Cardiovascular:     Rate and Rhythm: Regular rhythm.     Heart sounds: S1 normal and S2 normal. No murmur. No friction rub. No gallop.   Pulmonary:     Effort: No respiratory distress.     Breath sounds: Decreased breath sounds present.  Chest:     Chest wall: No tenderness.  Abdominal:  General: Bowel sounds are normal.     Palpations: Abdomen is soft.     Tenderness: There is no abdominal tenderness. There is no guarding or rebound. Negative signs include Murphy's sign and McBurney's sign.     Hernia: No hernia is present.  Musculoskeletal:        General: Normal range of motion.     Cervical back:  Normal range of motion and neck supple.  Skin:    General: Skin is warm and dry.     Findings: No rash.  Neurological:     Mental Status: She is alert and oriented to person, place, and time.     GCS: GCS eye subscore is 4. GCS verbal subscore is 5. GCS motor subscore is 6.     Cranial Nerves: No cranial nerve deficit.     Sensory: No sensory deficit.     Coordination: Coordination normal.  Psychiatric:        Speech: Speech normal.        Behavior: Behavior normal.        Thought Content: Thought content normal.     ED Results / Procedures / Treatments   Labs (all labs ordered are listed, but only abnormal results are displayed) Labs Reviewed - No data to display  EKG EKG Interpretation  Date/Time:  _0 /06/21 0701           Orpah Greek, MD 05/08/20 [phone]

## 2020-06-05 ENCOUNTER — Other Ambulatory Visit: Payer: Self-pay | Admitting: Internal Medicine

## 2020-06-05 MED ORDER — ONETOUCH VERIO VI STRP: ORAL_STRIP | 12 refills | Status: DC

## 2020-06-05 MED ORDER — ONETOUCH VERIO W/DEVICE KIT: PACK | 0 refills | Status: DC

## 2020-06-14 ENCOUNTER — Other Ambulatory Visit: Payer: Self-pay | Admitting: Internal Medicine

## 2020-06-14 NOTE — Addendum Note (Signed)
Addended by: Earnstine Regal on: 06/14/2020 02:15 PM   Modules accepted: Orders

## 2020-06-28 ENCOUNTER — Other Ambulatory Visit: Payer: Self-pay

## 2020-06-28 ENCOUNTER — Encounter: Payer: Self-pay | Admitting: Internal Medicine

## 2020-06-28 ENCOUNTER — Ambulatory Visit (INDEPENDENT_AMBULATORY_CARE_PROVIDER_SITE_OTHER): Payer: 59 | Admitting: Internal Medicine

## 2020-06-28 VITALS — BP 120/80 | HR 74 | Temp 98.1°F | Ht 64.0 in | Wt 223.0 lb

## 2020-06-28 DIAGNOSIS — E785 Hyperlipidemia, unspecified: Secondary | ICD-10-CM

## 2020-06-28 DIAGNOSIS — I10 Essential (primary) hypertension: Secondary | ICD-10-CM | POA: Diagnosis not present

## 2020-06-28 DIAGNOSIS — E119 Type 2 diabetes mellitus without complications: Secondary | ICD-10-CM | POA: Diagnosis not present

## 2020-06-28 DIAGNOSIS — M170 Bilateral primary osteoarthritis of knee: Secondary | ICD-10-CM

## 2020-06-28 DIAGNOSIS — Z1231 Encounter for screening mammogram for malignant neoplasm of breast: Secondary | ICD-10-CM

## 2020-06-28 MED ORDER — FLUCONAZOLE 150 MG PO TABS
ORAL_TABLET | ORAL | 1 refills | Status: DC
Start: 2020-06-28 — End: 2021-10-25

## 2020-06-28 NOTE — Patient Instructions (Addendum)
You will be contacted regarding the referral for: mammogram  Please take all new medication as prescribed - diflucan  We will fill out the disability forms  Please continue all other medications as before, and refills have been done if requested.  Please have the pharmacy call with any other refills you may need.  Please continue your efforts at being more active, low cholesterol diet, and weight control.  Please keep your appointments with your specialists as you may have planned  Please go to the LAB at the blood drawing area for the tests to be done  You will be contacted by phone if any changes need to be made immediately.  Otherwise, you will receive a letter about your results with an explanation, but please check with MyChart first.  Please remember to sign up for MyChart if you have not done so, as this will be important to you in the future with finding out test results, communicating by private email, and scheduling acute appointments online when needed.  Please make an Appointment to return in 6 months, or sooner if needed

## 2020-06-28 NOTE — Addendum Note (Signed)
Addended by: Cresenciano Lick on: 06/28/2020 02:39 PM   Modules accepted: Orders

## 2020-06-28 NOTE — Progress Notes (Signed)
Subjective:    Patient ID: Theresa Gibson, female    DOB: [birth year], 64 y.o.   MRN: [id number]  HPI  Here to f/u; overall doing ok,  Pt denies chest pain, increasing sob or doe, wheezing, orthopnea, PND, increased LE swelling, palpitations, dizziness or syncope.  Pt denies new neurological symptoms such as new headache, or facial or extremity weakness or numbness.  Pt denies polydipsia, polyuria, or low sugar episode.  Pt states overall good compliance with meds, Has ongonig bilateral knee OA and obesity, now awarded 100% disabled per SSI.  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  Can only have knee surgury TKR is she loses wt but unable now. Due for mammogram. Has yeast infection asking for diflucan Past Medical History:  Diagnosis Date  . Acute MI, inferior wall ([place]) 08/2016   s/p DES x 2 to the RCA  . Allergy   . Arthritis   . Depression   . GERD (gastroesophageal reflux disease)   . Hypertension   . Meningioma ([place])   . PONV (postoperative nausea and vomiting)    nausea only  . Wears dentures    upper   Past Surgical History:  Procedure Laterality Date  . ABDOMINAL HYSTERECTOMY     Total   . BREAST EXCISIONAL BIOPSY Left    approx 30+ years ago  . BREAST SURGERY     BX right breast  . COLONOSCOPY W/ BIOPSIES AND POLYPECTOMY    . CORONARY ANGIOPLASTY WITH STENT PLACEMENT    . KNEE SURGERY Bilateral    arthroscopy  . LAPAROSCOPIC APPENDECTOMY N/A 12/10/2015   Procedure: APPENDECTOMY LAPAROSCOPIC;  Surgeon: Erroll Luna, MD;  Location: [facility];  Service: General;  Laterality: N/A;  . MULTIPLE TOOTH EXTRACTIONS    . NASAL/SINUS ENDOSCOPY Bilateral 03/29/2017   ENDOSCOPIC SINUS SURGERY WITH NAVIGATION (Bilateral)  . SINUS ENDO W/FUSION Bilateral 03/29/2017   Procedure: ENDOSCOPIC SINUS SURGERY WITH NAVIGATION;  Surgeon: Melissa Montane, MD;   Location: [facility];  Service: ENT;  Laterality: Bilateral;  . TUBAL LIGATION    . TURBINATE REDUCTION Bilateral 03/29/2017   Procedure: TURBINATE REDUCTION;  Surgeon: Melissa Montane, MD;  Location: [facility];  Service: ENT;  Laterality: Bilateral;    reports that she quit smoking about 2 years ago. Her smoking use included cigarettes. She has a 21.00 pack-year smoking history. She has never used smokeless tobacco. She reports current alcohol use. She reports that she does not use drugs. family history includes Arthritis in her mother; Heart attack in her father; Hypertension in her mother; Ovarian cancer in her maternal grandmother. Allergies  Allergen Reactions  . Nicotine     Patch...caused a rash  . Ace Inhibitors Cough    REACTION: Dry cough-lisinopril    Current Outpatient Medications on File Prior to Visit  Medication Sig Dispense Refill  . acetaminophen (TYLENOL) 500 MG tablet Take 2 tablets (1,000 mg total) by mouth every 8 (eight) hours as needed for moderate pain. 30 tablet 0  . albuterol (VENTOLIN HFA) 108 (90 Base) MCG/ACT inhaler INHALE 2 PUFFS BY MOUTH EVERY 4 TO 6 HOURS AS NEEDED FOR SHORTNESS OF BREATH WHEEZING OR COUGHING    . aspirin EC 81 MG tablet Take 81 mg by mouth daily.    Marland Kitchen atorvastatin (LIPITOR) 40 MG tablet Take 1 tablet (40 mg  total) by mouth daily at 6 PM. 90 tablet 3  . Blood Glucose Monitoring Suppl (ONETOUCH VERIO) w/Device KIT Use as directed twice daily e11.9 1 kit 0  . buPROPion (WELLBUTRIN SR) 150 MG 12 hr tablet TAKE 1 TABLET BY MOUTH TWICE DAILY NEED  TO  ESTABLISH  WITH  NEW  PROVIDER 180 tablet 0  . Calcium Carbonate-Vitamin D 600-200 MG-UNIT TABS Take by mouth.    . carvedilol (COREG) 3.125 MG tablet Take 3.125 mg by mouth 2 (two) times daily with a meal.    . cetirizine (ZYRTEC) 10 MG tablet Take 10 mg by mouth daily as needed for allergies.    . cholecalciferol (VITAMIN D) 1000 units tablet Take 1,000 Units by mouth daily.    . cyanocobalamin 1000 MCG tablet  Take by mouth.    . diclofenac sodium (VOLTAREN) 1 % GEL Apply 4 g topically 4 (four) times daily. 100 g 1  . Dulaglutide 0.75 MG/0.5ML SOPN Inject into the skin.    . fexofenadine-pseudoephedrine (ALLEGRA-D 24) 180-240 MG 24 hr tablet Take 1 tablet by mouth daily.    . fluticasone (FLONASE) 50 MCG/ACT nasal spray Place 2 sprays into both nostrils daily. 16 g 0  . glucose blood (ONETOUCH VERIO) test strip Use as instructed twice daily E11.9 200 each 12  . JARDIANCE 10 MG TABS tablet Take 10 mg by mouth daily.    Marland Kitchen ketoconazole (NIZORAL) 2 % cream APPLY  CREAM TOPICALLY TO VAGINAL AREA TWICE DAILY FOR 10 DAYS 45 g 0  . Lancets MISC Use as directed once daily E11.9 100 each 3  . losartan (COZAAR) 50 MG tablet Take 1 tablet (50 mg total) by mouth daily. 90 tablet 3  . naproxen (NAPROSYN) 500 MG tablet TAKE 1 TABLET BY MOUTH TWICE DAILY WITH MEALS as needed 60 tablet 3  . nitroGLYCERIN (NITROSTAT) 0.4 MG SL tablet Place 0.4 mg under the tongue every 5 (five) minutes as needed for chest pain.    . pantoprazole (PROTONIX) 40 MG tablet Take 1 tablet (40 mg total) by mouth daily at 6 (six) AM. 30 tablet 3  . topiramate (TOPAMAX) 50 MG tablet TAKE 1 TAB BY MOUTH TWICE DAILY 180 tablet 0  . traMADol (ULTRAM) 50 MG tablet Take 1 tablet (50 mg total) by mouth every 6 (six) hours as needed. 15 tablet 0  . triamcinolone (NASACORT) 55 MCG/ACT AERO nasal inhaler Place 2 sprays into the nose daily. (Patient taking differently: Place 1-2 sprays into the nose daily as needed (NASAL CONGESTION). )    . TRULICITY 7.16 RC/7.8LF SOPN     . Vitamin D, Ergocalciferol, (DRISDOL) 1.25 MG (50000 UT) CAPS capsule Take 1 capsule (50,000 Units total) by mouth every 7 (seven) days. 12 capsule 0   No current facility-administered medications on file prior to visit.   Review of Systems All otherwise neg per pt     Objective:   Physical Exam BP 120/80 (BP Location: Left Arm, Patient Position: Sitting, Cuff Size: Large)    Pulse 74   Temp 98.1 F (36.7 C) (Oral)   Ht '5\' 4"'  (1.626 m)   Wt (!) 223 lb (101.2 kg)   SpO2 98%   BMI 38.28 kg/m  VS noted,  Constitutional: Pt appears in NAD HENT: Head: NCAT.  Right Ear: External ear normal.  Left Ear: External ear normal.  Eyes: . Pupils are equal, round, and reactive to light. Conjunctivae and EOM are normal Nose: without d/c or deformity Neck: Neck supple.  Gross normal ROM Cardiovascular: Normal rate and regular rhythm.   Pulmonary/Chest: Effort normal and breath sounds without rales or wheezing.  Abd:  Soft, NT, ND, + BS, no organomegaly Neurological: Pt is alert. At baseline orientation, motor grossly intact Skin: Skin is warm. No rashes, other new lesions, no LE edema Psychiatric: Pt behavior is normal without agitation  All otherwise neg per pt Lab Results  Component Value Date   WBC 7.4 01/08/2020   HGB 14.0 01/08/2020   HCT 43.2 01/08/2020   PLT 252.0 01/08/2020   GLUCOSE 80 06/28/2020   CHOL 156 06/28/2020   TRIG 77 06/28/2020   HDL 59 06/28/2020   LDLCALC 81 06/28/2020   ALT 17 06/28/2020   AST 14 06/28/2020   NA 139 06/28/2020   K 4.3 06/28/2020   CL 106 06/28/2020   CREATININE 0.77 06/28/2020   BUN 22 06/28/2020   CO2 24 06/28/2020   TSH 1.02 01/08/2020   INR 1.08 03/29/2017   HGBA1C 6.2 (H) 06/28/2020   MICROALBUR 1.2 01/08/2020      Assessment & Plan:

## 2020-06-28 NOTE — Assessment & Plan Note (Signed)
Now awarded SSI from feb 2021, will consider tyring to work limited hours per wk sitting.

## 2020-06-29 ENCOUNTER — Encounter: Payer: Self-pay | Admitting: Internal Medicine

## 2020-06-29 LAB — BASIC METABOLIC PANEL
BUN: 22 mg/dL (ref 7–25)
CO2: 24 mmol/L (ref 20–32)
Calcium: 9.4 mg/dL (ref 8.6–10.4)
Chloride: 106 mmol/L (ref 98–110)
Creat: 0.77 mg/dL (ref 0.50–0.99)
Glucose, Bld: 80 mg/dL (ref 65–99)
Potassium: 4.3 mmol/L (ref 3.5–5.3)
Sodium: 139 mmol/L (ref 135–146)

## 2020-06-29 LAB — HEPATIC FUNCTION PANEL
AG Ratio: 1.5 (calc) (ref 1.0–2.5)
ALT: 17 U/L (ref 6–29)
AST: 14 U/L (ref 10–35)
Albumin: 4 g/dL (ref 3.6–5.1)
Alkaline phosphatase (APISO): 117 U/L (ref 37–153)
Bilirubin, Direct: 0.1 mg/dL (ref 0.0–0.2)
Globulin: 2.6 g/dL (calc) (ref 1.9–3.7)
Indirect Bilirubin: 0.3 mg/dL (calc) (ref 0.2–1.2)
Total Bilirubin: 0.4 mg/dL (ref 0.2–1.2)
Total Protein: 6.6 g/dL (ref 6.1–8.1)

## 2020-06-29 LAB — LIPID PANEL
Cholesterol: 156 mg/dL (ref <200)
HDL: 59 mg/dL (ref >=50)
LDL Cholesterol (Calc): 81 mg/dL (calc)
Non-HDL Cholesterol (Calc): 97 mg/dL (calc) (ref <130)
Total CHOL/HDL Ratio: 2.6 (calc) (ref <5.0)
Triglycerides: 77 mg/dL (ref <150)

## 2020-06-29 LAB — HEMOGLOBIN A1C
Hgb A1c MFr Bld: 6.2 % of total Hgb — ABNORMAL HIGH (ref <5.7)
Mean Plasma Glucose: 131 (calc)
eAG (mmol/L): 7.3 (calc)

## 2020-07-01 ENCOUNTER — Telehealth: Payer: Self-pay | Admitting: Internal Medicine

## 2020-07-01 NOTE — Telephone Encounter (Signed)
   Patient calling to check status of LTD forms. Patient states paperwork was provided during last ov. Patient requesting phone call when complete and email to address on file

## 2020-07-03 ENCOUNTER — Encounter: Payer: Self-pay | Admitting: Internal Medicine

## 2020-07-03 NOTE — Assessment & Plan Note (Signed)
stable overall by history and exam, recent data reviewed with pt, and pt to continue medical treatment as before,  to f/u any worsening symptoms or concerns  

## 2020-07-04 NOTE — Telephone Encounter (Signed)
Spoke with patient and informed we do have forms. She is requesting - 4 hours a day 3 x week Starting 07/25/20, Sitting only.   Forms have been completed &Placed in provider box to review and sign.

## 2020-07-05 DIAGNOSIS — Z0279 Encounter for issue of other medical certificate: Secondary | ICD-10-CM

## 2020-07-05 NOTE — Telephone Encounter (Signed)
Forms have been signed, Faxed, Copy sent to scan &Charged for.   Original mailed to patient for her records.

## 2020-07-11 ENCOUNTER — Telehealth: Payer: Self-pay

## 2020-07-11 NOTE — Telephone Encounter (Signed)
New message    The patient is asking can the paperwork that was fax over to your insurance company be entered in [place] so she can view them if not can the [facility] email her.

## 2020-07-20 ENCOUNTER — Ambulatory Visit
Admission: RE | Admit: 2020-07-20 | Discharge: 2020-07-20 | Disposition: A | Discharge status: Home / Self Care | Payer: 59 | Source: Ambulatory Visit | Attending: Internal Medicine | Admitting: Internal Medicine

## 2020-07-20 ENCOUNTER — Other Ambulatory Visit: Payer: Self-pay

## 2020-07-20 DIAGNOSIS — Z1231 Encounter for screening mammogram for malignant neoplasm of breast: Secondary | ICD-10-CM

## 2020-07-27 ENCOUNTER — Encounter: Payer: Self-pay | Admitting: Internal Medicine

## 2020-07-28 ENCOUNTER — Other Ambulatory Visit: Payer: Self-pay

## 2020-07-28 DIAGNOSIS — E119 Type 2 diabetes mellitus without complications: Secondary | ICD-10-CM

## 2020-07-28 MED ORDER — TRULICITY 0.75 MG/0.5ML ~~LOC~~ SOAJ: SUBCUTANEOUS | 2 refills | Status: DC

## 2020-10-04 ENCOUNTER — Other Ambulatory Visit: Payer: Self-pay | Admitting: Internal Medicine

## 2020-10-04 MED ORDER — ACCU-CHEK GUIDE ME W/DEVICE KIT: PACK | 0 refills | Status: AC

## 2020-10-12 ENCOUNTER — Telehealth: Payer: Self-pay | Admitting: Internal Medicine

## 2020-10-12 NOTE — Telephone Encounter (Signed)
[facility] called and was wondering if test strips and lancets could be sent in for the patient for Accu check guide  The pharmacys fax machine is down.  [place], [place].

## 2020-10-14 ENCOUNTER — Other Ambulatory Visit: Payer: Self-pay

## 2020-10-14 MED ORDER — GLUCOSE BLOOD VI STRP: ORAL_STRIP | 12 refills | Status: DC

## 2020-10-14 MED ORDER — LANCETS MISC: 3 refills | Status: DC

## 2020-12-01 ENCOUNTER — Telehealth: Payer: Self-pay | Admitting: Internal Medicine

## 2020-12-01 DIAGNOSIS — R519 Headache, unspecified: Secondary | ICD-10-CM

## 2020-12-01 NOTE — Telephone Encounter (Signed)
Ok referral done 

## 2020-12-01 NOTE — Telephone Encounter (Signed)
   Patient calling to request referral to [facility] for headaches Patient states she has seen them in the past but new referral required

## 2020-12-01 NOTE — Addendum Note (Signed)
Addended by: Corwin Levins on: 12/01/2020 01:09 PM   Modules accepted: Orders

## 2020-12-03 ENCOUNTER — Other Ambulatory Visit: Payer: Self-pay

## 2020-12-03 DIAGNOSIS — Z5321 Procedure and treatment not carried out due to patient leaving prior to being seen by health care provider: Secondary | ICD-10-CM | POA: Diagnosis not present

## 2020-12-03 DIAGNOSIS — R110 Nausea: Secondary | ICD-10-CM | POA: Diagnosis not present

## 2020-12-03 DIAGNOSIS — R109 Unspecified abdominal pain: Secondary | ICD-10-CM | POA: Diagnosis not present

## 2020-12-03 NOTE — ED Triage Notes (Signed)
Patient c/o left flank pain started today. Pt stated unable to tolerate back pain and it makes her nauseous. Pt stated taking tylenol and cream but no relief.

## 2020-12-04 ENCOUNTER — Emergency Department (HOSPITAL_COMMUNITY)
Admission: EM | Admit: 2020-12-04 | Discharge: 2020-12-04 | Disposition: A | Discharge status: Home / Self Care | Payer: 59 | Attending: Emergency Medicine | Admitting: Emergency Medicine

## 2020-12-04 NOTE — ED Notes (Signed)
Pt called for vitals. No answer.  

## 2020-12-04 NOTE — ED Notes (Signed)
Patient was called to reassess vital signs but no response.

## 2020-12-04 NOTE — ED Notes (Signed)
Pt was called to reassess vital signs but no response.

## 2020-12-19 ENCOUNTER — Other Ambulatory Visit: Payer: Self-pay

## 2020-12-19 ENCOUNTER — Emergency Department (HOSPITAL_BASED_OUTPATIENT_CLINIC_OR_DEPARTMENT_OTHER)
Admission: EM | Admit: 2020-12-19 | Discharge: 2020-12-19 | Disposition: A | Discharge status: Home / Self Care | Payer: 59 | Attending: Emergency Medicine | Admitting: Emergency Medicine

## 2020-12-19 ENCOUNTER — Encounter (HOSPITAL_BASED_OUTPATIENT_CLINIC_OR_DEPARTMENT_OTHER): Payer: Self-pay | Admitting: *Deleted

## 2020-12-19 DIAGNOSIS — R0981 Nasal congestion: Secondary | ICD-10-CM | POA: Diagnosis present

## 2020-12-19 DIAGNOSIS — E119 Type 2 diabetes mellitus without complications: Secondary | ICD-10-CM | POA: Insufficient documentation

## 2020-12-19 DIAGNOSIS — J029 Acute pharyngitis, unspecified: Secondary | ICD-10-CM

## 2020-12-19 DIAGNOSIS — Z79899 Other long term (current) drug therapy: Secondary | ICD-10-CM | POA: Insufficient documentation

## 2020-12-19 DIAGNOSIS — I10 Essential (primary) hypertension: Secondary | ICD-10-CM | POA: Insufficient documentation

## 2020-12-19 DIAGNOSIS — Z87891 Personal history of nicotine dependence: Secondary | ICD-10-CM | POA: Insufficient documentation

## 2020-12-19 DIAGNOSIS — Z7982 Long term (current) use of aspirin: Secondary | ICD-10-CM | POA: Insufficient documentation

## 2020-12-19 DIAGNOSIS — U071 COVID-19: Secondary | ICD-10-CM | POA: Insufficient documentation

## 2020-12-19 DIAGNOSIS — Z20822 Contact with and (suspected) exposure to covid-19: Secondary | ICD-10-CM

## 2020-12-19 DIAGNOSIS — I2510 Atherosclerotic heart disease of native coronary artery without angina pectoris: Secondary | ICD-10-CM | POA: Diagnosis not present

## 2020-12-19 DIAGNOSIS — Z955 Presence of coronary angioplasty implant and graft: Secondary | ICD-10-CM | POA: Insufficient documentation

## 2020-12-19 LAB — SARS CORONAVIRUS 2 (TAT 6-24 HRS): SARS Coronavirus 2: POSITIVE — AB

## 2020-12-19 MED ORDER — FLUTICASONE PROPIONATE 50 MCG/ACT NA SUSP
2.0000 | Freq: Every day | NASAL | 2 refills | Status: DC
Start: 2020-12-19 — End: 2021-01-29

## 2020-12-19 MED ORDER — KETOROLAC TROMETHAMINE 15 MG/ML IJ SOLN
15.0000 mg | Freq: Once | INTRAMUSCULAR | Status: AC
  Administered 2020-12-19: 15 mg via INTRAMUSCULAR
  Filled 2020-12-19: qty 1

## 2020-12-19 MED ORDER — SALINE SPRAY 0.65 % NA SOLN
1.0000 | NASAL | 0 refills | Status: DC | PRN
Start: 2020-12-19 — End: 2023-06-10

## 2020-12-19 NOTE — ED Provider Notes (Signed)
[facility] EMERGENCY DEPARTMENT Provider Note   CSN: [id number] Arrival date & time: 12/19/20  4401     History Chief Complaint  Patient presents with  . Other    Covid symptoms     Theresa Gibson is a 65 y.o. female.  HPI     This is a 65 year old female with a history of MI, hypertension, reflux who presents with congestion, chills, sore throat, headache.  Patient reports onset of symptoms yesterday.  She states that she took some Coricidin HBP last night with minimal relief.  She describes head "pressure" and congestion.  She feels she may have a sinus infection.  She states it made it difficult for her to sleep last night.  She did not take her temperature at home but states that she woke up sweating.  She is fully vaccinated and boosted against COVID-19.  However, she did have a known sick contact with the daughter who tested positive on Wednesday.  She denies any cough, chest pain, shortness of breath, abdominal pain.  She does report some nausea.  Past Medical History:  Diagnosis Date  . Acute MI, inferior wall ([place]) 08/2016   s/p DES x 2 to the RCA  . Allergy   . Arthritis   . Depression   . GERD (gastroesophageal reflux disease)   . Hypertension   . Meningioma ([place])   . PONV (postoperative nausea and vomiting)    nausea only  . Wears dentures    upper    Patient Active Problem List   Diagnosis Date Noted  . Sinus infection 11/09/2019  . Preventative health care 06/25/2019  . Vaginitis 03/24/2019  . Sensorineural hearing loss (SNHL), bilateral 11/06/2018  . Tinnitus, bilateral 11/06/2018  . Chronic pain of both knees 07/28/2018  . Presence of coronary angioplasty implant and graft 06/03/2018  . Diabetes mellitus without complication ([place]) 02/72/5366  . Chronic sinusitis 03/29/2017  . Atherosclerotic heart disease of native coronary artery without angina pectoris 03/06/2017  . S/P drug eluting coronary stent placement 03/06/2017  . Situational  anxiety 11/19/2016  . Myocardial infarction ([place]) 11/19/2016  . Vitamin D deficiency 09/13/2015  . Generalized headaches 04/21/2015  . Osteoarthritis 04/21/2015  . Decreased libido 09/21/2014  . CARPAL TUNNEL SYNDROME, BILATERAL 12/03/2007  . Hypersomnia with sleep apnea 06/24/2007  . CAVERNOUS HEMANGIOMA 04/18/2007  . Hyperlipidemia with target LDL less than 70 12/10/2006  . Obesity 12/10/2006  . Anxiety and depression 12/10/2006  . Essential hypertension 12/10/2006  . Allergic rhinitis 12/10/2006  . GERD 12/10/2006    Past Surgical History:  Procedure Laterality Date  . ABDOMINAL HYSTERECTOMY     Total   . BREAST EXCISIONAL BIOPSY Left    approx 30+ years ago  . BREAST SURGERY     BX right breast  . COLONOSCOPY W/ BIOPSIES AND POLYPECTOMY    . CORONARY ANGIOPLASTY WITH STENT PLACEMENT    . KNEE SURGERY Bilateral    arthroscopy  . LAPAROSCOPIC APPENDECTOMY N/A 12/10/2015   Procedure: APPENDECTOMY LAPAROSCOPIC;  Surgeon: Erroll Luna, MD;  Location: [facility];  Service: General;  Laterality: N/A;  . MULTIPLE TOOTH EXTRACTIONS    . NASAL/SINUS ENDOSCOPY Bilateral 03/29/2017   ENDOSCOPIC SINUS SURGERY WITH NAVIGATION (Bilateral)  . SINUS ENDO W/FUSION Bilateral 03/29/2017   Procedure: ENDOSCOPIC SINUS SURGERY WITH NAVIGATION;  Surgeon: Melissa Montane, MD;  Location: [facility];  Service: ENT;  Laterality: Bilateral;  . TUBAL LIGATION    . TURBINATE REDUCTION Bilateral 03/29/2017   Procedure: TURBINATE REDUCTION;  Surgeon: Melissa Montane, MD;  Location: [place];  Service: ENT;  Laterality: Bilateral;     OB History   No obstetric history on file.     Family History  Problem Relation Age of Onset  . Hypertension Mother   . Arthritis Mother   . Heart attack Father        In his 32's  . Ovarian cancer Maternal Grandmother   . Colon cancer Neg Hx   . Colon polyps Neg Hx   . Rectal cancer Neg Hx   . Stomach cancer Neg Hx   . Breast cancer Neg Hx     Social History   Tobacco Use  .  Smoking status: Former Smoker    Packs/day: 0.50    Years: 42.00    Pack years: 21.00    Types: Cigarettes    Quit date: 01/03/2018    Years since quitting: 2.9  . Smokeless tobacco: Never Used  Vaping Use  . Vaping Use: Never used  Substance Use Topics  . Alcohol use: Yes    Alcohol/week: 0.0 standard drinks    Comment: occ  . Drug use: No    Home Medications Prior to Admission medications   Medication Sig Start Date End Date Taking? Authorizing Provider  buPROPion (WELLBUTRIN SR) 200 MG 12 hr tablet Take 200 mg by mouth 2 (two) times daily.   Yes [provider]  fluticasone (FLONASE) 50 MCG/ACT nasal spray Place 2 sprays into both nostrils daily. 12/19/20  Yes Horton, Barbette Hair, MD  sodium chloride (OCEAN) 0.65 % SOLN nasal spray Place 1 spray into both nostrils as needed for congestion. 12/19/20  Yes Horton, Barbette Hair, MD  acetaminophen (TYLENOL) 500 MG tablet Take 2 tablets (1,000 mg total) by mouth every 8 (eight) hours as needed for moderate pain. 09/30/17   Nche, Charlene Brooke, NP  albuterol (VENTOLIN HFA) 108 (90 Base) MCG/ACT inhaler INHALE 2 PUFFS BY MOUTH EVERY 4 TO 6 HOURS AS NEEDED FOR SHORTNESS OF BREATH WHEEZING OR COUGHING 05/27/20   [provider]  aspirin EC 81 MG tablet Take 81 mg by mouth daily.    [provider]  atorvastatin (LIPITOR) 40 MG tablet Take 1 tablet (40 mg total) by mouth daily at 6 PM. 01/22/20   Biagio Borg, MD  Blood Glucose Monitoring Suppl (ACCU-CHEK GUIDE ME) w/Device KIT Use as directed four times daily E11.9 10/04/20   Biagio Borg, MD  Calcium Carbonate-Vitamin D 600-200 MG-UNIT TABS Take by mouth. 03/24/19   [provider]  carvedilol (COREG) 3.125 MG tablet Take 3.125 mg by mouth 2 (two) times daily with a meal.    [provider]  cetirizine (ZYRTEC) 10 MG tablet Take 10 mg by mouth daily as needed for allergies.    [provider]  cholecalciferol (VITAMIN D) 1000 units tablet Take  1,000 Units by mouth daily.    [provider]  cyanocobalamin 1000 MCG tablet Take by mouth.    [provider]  diclofenac sodium (VOLTAREN) 1 % GEL Apply 4 g topically 4 (four) times daily. 07/28/18   Lance Sell, NP  fexofenadine-pseudoephedrine (ALLEGRA-D 24) 180-240 MG 24 hr tablet Take 1 tablet by mouth daily.    [provider]  fluconazole (DIFLUCAN) 150 MG tablet 1 tab by mouth every 3 days as needed 06/28/20   Biagio Borg, MD  fluticasone [facility]) 50 MCG/ACT nasal spray Place 2 sprays into both nostrils daily. 12/20/17   Palumbo, April, MD  glucose blood test strip Use to test blood sugar levels once a day. DX: E11.9..  Test strips for Accu-Chek Guide 10/14/20   Biagio Borg, MD  JARDIANCE 10 MG TABS tablet Take 10 mg by mouth daily. 06/14/20   [provider]  ketoconazole (NIZORAL) 2 % cream APPLY  CREAM TOPICALLY TO VAGINAL AREA TWICE DAILY FOR 10 DAYS 03/07/20   Plotnikov, Evie Lacks, MD  Lancets MISC Use as directed once daily E11.9 Accu-Chek Guide 10/14/20   Biagio Borg, MD  losartan (COZAAR) 50 MG tablet Take 1 tablet (50 mg total) by mouth daily. 07/28/18   Lance Sell, NP  naproxen (NAPROSYN) 500 MG tablet TAKE 1 TABLET BY MOUTH TWICE DAILY WITH MEALS as needed 02/16/20   Biagio Borg, MD  nitroGLYCERIN (NITROSTAT) 0.4 MG SL tablet Place 0.4 mg under the tongue every 5 (five) minutes as needed for chest pain.    [provider]  pantoprazole (PROTONIX) 40 MG tablet Take 1 tablet (40 mg total) by mouth daily at 6 (six) AM. 03/02/17   Bhagat, Bhavinkumar, PA  topiramate (TOPAMAX) 50 MG tablet TAKE 1 TAB BY MOUTH TWICE DAILY 08/22/17   Golden Circle, FNP  traMADol (ULTRAM) 50 MG tablet Take 1 tablet (50 mg total) by mouth every 6 (six) hours as needed. 06/26/18   Ashley Murrain, NP  triamcinolone (NASACORT) 55 MCG/ACT AERO nasal inhaler Place 2 sprays into the nose daily. Patient taking differently: Place 1-2 sprays  into the nose daily as needed (NASAL CONGESTION).  01/04/17   Molpus, John, MD  TRULICITY 3.42 AJ/6.8TL SOPN Inject once a week. 07/28/20   Biagio Borg, MD  Vitamin D, Ergocalciferol, (DRISDOL) 1.25 MG (50000 UT) CAPS capsule Take 1 capsule (50,000 Units total) by mouth every 7 (seven) days. 06/25/19   Biagio Borg, MD    Allergies    Nicotine and Ace inhibitors  Review of Systems   Review of Systems  Constitutional: Positive for chills. Negative for fever.  HENT: Positive for congestion, sinus pressure and sore throat.   Respiratory: Negative for cough and shortness of breath.   Cardiovascular: Negative for chest pain.  Gastrointestinal: Positive for nausea. Negative for abdominal pain and vomiting.  Neurological: Positive for headaches. Negative for weakness.  All other systems reviewed and are negative.   Physical Exam Updated Vital Signs BP (!) 124/59 (BP Location: Right Arm)   Pulse 87   Temp 98.3 F (36.8 C) (Oral)   Resp 18   Ht 1.6 m ('5\' 3"' )   Wt 103.4 kg   SpO2 98%   BMI 40.39 kg/m   Physical Exam Vitals and nursing note reviewed.  Constitutional:      Appearance: She is well-developed and well-nourished. She is obese. She is not ill-appearing.  HENT:     Head: Normocephalic and atraumatic.     Nose: Congestion present.     Mouth/Throat:     Mouth: Mucous membranes are moist.     Comments: Slight posterior oropharyngeal erythema, no exudate, uvula midline Eyes:     Pupils: Pupils are equal, round, and reactive to light.  Cardiovascular:     Rate and Rhythm: Normal rate and regular rhythm.     Heart sounds: Normal heart sounds.  Pulmonary:     Effort: Pulmonary effort is normal. No respiratory distress.     Breath sounds: No wheezing.  Abdominal:     General: Bowel sounds are normal.     Palpations: Abdomen  is soft.     Tenderness: There is no abdominal tenderness. There is no guarding or rebound.  Musculoskeletal:     Cervical back: Neck supple.      Right lower leg: No edema.     Left lower leg: No edema.  Skin:    General: Skin is warm and dry.  Neurological:     Mental Status: She is alert and oriented to person, place, and time.  Psychiatric:        Mood and Affect: Mood and affect and mood normal.     ED Results / Procedures / Treatments   Labs (all labs ordered are listed, but only abnormal results are displayed) Labs Reviewed  SARS CORONAVIRUS 2 (TAT 6-24 HRS)    EKG None  Radiology No results found.  Procedures Procedures (including critical care time)  Medications Ordered in ED Medications - No data to display  ED Course  I have reviewed the triage vital signs and the nursing notes.  Pertinent labs & imaging results that were available during my care of the patient were reviewed by me and considered in my medical decision making (see chart for details).    MDM Rules/Calculators/A&P                           Patient presents with upper respiratory symptoms including sore throat, headache, nasal congestion.  She is overall nontoxic and vital signs are reassuring.  She is afebrile.  She does not appear in acute distress.  She does have a known exposure to COVID-19 but is fully vaccinated.  Given sore throat and headache, patient may have a breakthrough COVID-19 infection.  Will test.  Patient is concerned she may have a sinus infection.  Given that she is only had 1 day of symptoms, would elect to treat supportively with nasal saline and Flonase, Tylenol or Motrin for any body aches or pains.  At this time no indication for antibiotics.  Low suspicion for strep pharyngitis.  Suspect viral etiology.  Patient reports she has access to [facility].  She will check COVID-19 results through [facility].  Patient was given a dose of Toradol given her headache.  Last known creatinine was normal.  Recommend supportive measures at home.  After history, exam, and medical workup I feel the patient has been appropriately medically  screened and is safe for discharge home. Pertinent diagnoses were discussed with the patient. Patient was given return precautions.  Theresa Gibson was evaluated in Emergency Department on 12/19/2020 for the symptoms described in the history of present illness. She was evaluated in the context of the global COVID-19 pandemic, which necessitated consideration that the patient might be at risk for infection with the SARS-CoV-2 virus that causes COVID-19. Institutional protocols and algorithms that pertain to the evaluation of patients at risk for COVID-19 are in a state of rapid change based on information released by regulatory bodies including the CDC and federal and state organizations. These policies and algorithms were followed during the patient's care in the ED.   Final Clinical Impression(s) / ED Diagnoses Final diagnoses:  Sore throat  Nasal congestion  Exposure to COVID-19 virus    Rx / DC Orders ED Discharge Orders         Ordered    sodium chloride (OCEAN) 0.65 % SOLN nasal spray  As needed        12/19/20 0632    fluticasone (FLONASE) 50 MCG/ACT nasal spray  Daily        12/19/20 2659           Merryl Hacker, MD 12/19/20 [phone]

## 2020-12-19 NOTE — ED Triage Notes (Addendum)
Pt states sore throat, fever, chills, and body aches that started yesterday. States she has sinus pressure. States yesterday she felt sob. Denies any today. Daughter tested positive for covid on Wednesday. Took tylenol and cold medication. Also c/o general "severe" headache.

## 2020-12-19 NOTE — Discharge Instructions (Addendum)
You were seen today for sinus congestion, sore throat, chills.  Take Flonase and nasal saline as directed to clear out sinuses.  Additionally you may take Tylenol or Motrin for any body aches or pains, fevers.  Your COVID testing is pending.  Follow-up in MyChart for results.  If positive, you will need to quarantine for 5 days or until asymptomatic.

## 2020-12-20 ENCOUNTER — Encounter: Payer: Self-pay | Admitting: Adult Health

## 2020-12-20 ENCOUNTER — Telehealth (INDEPENDENT_AMBULATORY_CARE_PROVIDER_SITE_OTHER): Payer: 59 | Admitting: Internal Medicine

## 2020-12-20 ENCOUNTER — Encounter: Payer: Self-pay | Admitting: Internal Medicine

## 2020-12-20 ENCOUNTER — Other Ambulatory Visit: Payer: Self-pay | Admitting: Adult Health

## 2020-12-20 DIAGNOSIS — E119 Type 2 diabetes mellitus without complications: Secondary | ICD-10-CM

## 2020-12-20 DIAGNOSIS — U071 COVID-19: Secondary | ICD-10-CM

## 2020-12-20 DIAGNOSIS — F419 Anxiety disorder, unspecified: Secondary | ICD-10-CM | POA: Diagnosis not present

## 2020-12-20 DIAGNOSIS — F32A Depression, unspecified: Secondary | ICD-10-CM | POA: Diagnosis not present

## 2020-12-20 MED ORDER — ALBUTEROL SULFATE HFA 108 (90 BASE) MCG/ACT IN AERS: INHALATION_SPRAY | RESPIRATORY_TRACT | 5 refills | Status: DC

## 2020-12-20 MED ORDER — HYDROCODONE-HOMATROPINE 5-1.5 MG/5ML PO SYRP: 5.0000 mL | ORAL_SOLUTION | Freq: Four times a day (QID) | ORAL | 0 refills | Status: AC | PRN

## 2020-12-20 MED ORDER — DEXAMETHASONE 6 MG PO TABS
6.0000 mg | ORAL_TABLET | Freq: Every day | ORAL | 0 refills | Status: DC
Start: 2020-12-20 — End: 2021-07-03

## 2020-12-20 MED ORDER — ONDANSETRON HCL 4 MG PO TABS: 4.0000 mg | ORAL_TABLET | Freq: Three times a day (TID) | ORAL | 1 refills | Status: DC | PRN

## 2020-12-20 NOTE — Addendum Note (Signed)
Addended by: Biagio Borg on: 12/20/2020 01:28 PM   Modules accepted: Orders

## 2020-12-20 NOTE — Assessment & Plan Note (Signed)
Lab Results  Component Value Date   HGBA1C 6.2 (H) 06/28/2020   Stable, pt to continue current medical treatment - jardiance and trulicity

## 2020-12-20 NOTE — Progress Notes (Signed)
I connected by phone with Theresa Gibson on 12/20/2020 at 6:19 PM to discuss the potential use of a new treatment for mild to moderate COVID-19 viral infection in non-hospitalized patients.  This patient is a 65 y.o. female that meets the FDA criteria for Emergency Use Authorization of COVID monoclonal antibody sotrovimab.  Has a (+) direct SARS-CoV-2 viral test result  Has mild or moderate COVID-19   Is NOT hospitalized due to COVID-19  Is within 10 days of symptom onset  Has at least one of the high risk factor(s) for progression to severe COVID-19 and/or hospitalization as defined in EUA.  Specific high risk criteria : Older age (>/= 63 yo), BMI > 25, Diabetes and Cardiovascular disease or hypertension   I have spoken and communicated the following to the patient or parent/caregiver regarding COVID monoclonal antibody treatment:  1. FDA has authorized the emergency use for the treatment of mild to moderate COVID-19 in adults and pediatric patients with positive results of direct SARS-CoV-2 viral testing who are 30 years of age and older weighing at least 40 kg, and who are at high risk for progressing to severe COVID-19 and/or hospitalization.  2. The significant known and potential risks and benefits of COVID monoclonal antibody, and the extent to which such potential risks and benefits are unknown.  3. Information on available alternative treatments and the risks and benefits of those alternatives, including clinical trials.  4. Patients treated with COVID monoclonal antibody should continue to self-isolate and use infection control measures (e.g., wear mask, isolate, social distance, avoid sharing personal items, clean and disinfect "high touch" surfaces, and frequent handwashing) according to CDC guidelines.   5. The patient or parent/caregiver has the option to accept or refuse COVID monoclonal antibody treatment. 6. Cost reviewed, sx onset 12/18/2020  After reviewing this  information with the patient, the patient has agreed to receive one of the available covid 19 monoclonal antibodies and will be provided an appropriate fact sheet prior to infusion. Scot Dock, NP 12/20/2020 6:19 PM

## 2020-12-20 NOTE — Assessment & Plan Note (Signed)
Stable, cont current med tx - wellbutrin  Current Outpatient Medications (Endocrine & Metabolic):  .  dexamethasone (DECADRON) 6 MG tablet, Take 1 tablet (6 mg total) by mouth daily. Marland Kitchen  JARDIANCE 10 MG TABS tablet, Take 10 mg by mouth daily. .  TRULICITY 0.71 QR/9.7JO SOPN, Inject once a week.  Current Outpatient Medications (Cardiovascular):  .  atorvastatin (LIPITOR) 40 MG tablet, Take 1 tablet (40 mg total) by mouth daily at 6 PM. .  carvedilol (COREG) 3.125 MG tablet, Take 3.125 mg by mouth 2 (two) times daily with a meal. .  losartan (COZAAR) 50 MG tablet, Take 1 tablet (50 mg total) by mouth daily. .  nitroGLYCERIN (NITROSTAT) 0.4 MG SL tablet, Place 0.4 mg under the tongue every 5 (five) minutes as needed for chest pain.  Current Outpatient Medications (Respiratory):  .  HYDROcodone-homatropine (HYCODAN) 5-1.5 MG/5ML syrup, Take 5 mLs by mouth every 6 (six) hours as needed for up to 10 days. Marland Kitchen  albuterol (VENTOLIN HFA) 108 (90 Base) MCG/ACT inhaler, INHALE 2 PUFFS BY MOUTH EVERY 4 TO 6 HOURS AS NEEDED FOR SHORTNESS OF BREATH WHEEZING OR COUGHING .  cetirizine (ZYRTEC) 10 MG tablet, Take 10 mg by mouth daily as needed for allergies. .  fexofenadine-pseudoephedrine (ALLEGRA-D 24) 180-240 MG 24 hr tablet, Take 1 tablet by mouth daily. .  fluticasone (FLONASE) 50 MCG/ACT nasal spray, Place 2 sprays into both nostrils daily. .  fluticasone (FLONASE) 50 MCG/ACT nasal spray, Place 2 sprays into both nostrils daily. .  sodium chloride (OCEAN) 0.65 % SOLN nasal spray, Place 1 spray into both nostrils as needed for congestion. .  triamcinolone (NASACORT) 55 MCG/ACT AERO nasal inhaler, Place 2 sprays into the nose daily. (Patient taking differently: Place 1-2 sprays into the nose daily as needed (NASAL CONGESTION). )  Current Outpatient Medications (Analgesics):  .  acetaminophen (TYLENOL) 500 MG tablet, Take 2 tablets (1,000 mg total) by mouth every 8 (eight) hours as needed for moderate  pain. Marland Kitchen  aspirin EC 81 MG tablet, Take 81 mg by mouth daily. .  naproxen (NAPROSYN) 500 MG tablet, TAKE 1 TABLET BY MOUTH TWICE DAILY WITH MEALS as needed .  traMADol (ULTRAM) 50 MG tablet, Take 1 tablet (50 mg total) by mouth every 6 (six) hours as needed.  Current Outpatient Medications (Hematological):  .  cyanocobalamin 1000 MCG tablet, Take by mouth.  Current Outpatient Medications (Other):  .  ondansetron (ZOFRAN) 4 MG tablet, Take 1 tablet (4 mg total) by mouth every 8 (eight) hours as needed for nausea or vomiting. .  Blood Glucose Monitoring Suppl (ACCU-CHEK GUIDE ME) w/Device KIT, Use as directed four times daily E11.9 .  buPROPion (WELLBUTRIN SR) 200 MG 12 hr tablet, Take 200 mg by mouth 2 (two) times daily. .  Calcium Carbonate-Vitamin D 600-200 MG-UNIT TABS, Take by mouth. .  cholecalciferol (VITAMIN D) 1000 units tablet, Take 1,000 Units by mouth daily. .  diclofenac sodium (VOLTAREN) 1 % GEL, Apply 4 g topically 4 (four) times daily. .  fluconazole (DIFLUCAN) 150 MG tablet, 1 tab by mouth every 3 days as needed .  glucose blood test strip, Use to test blood sugar levels once a day. DX: E11.9..  Test strips for Accu-Chek Guide .  ketoconazole (NIZORAL) 2 % cream, APPLY  CREAM TOPICALLY TO VAGINAL AREA TWICE DAILY FOR 10 DAYS .  Lancets MISC, Use as directed once daily E11.9 Accu-Chek Guide .  pantoprazole (PROTONIX) 40 MG tablet, Take 1 tablet (40 mg total) by  mouth daily at 6 (six) AM. .  topiramate (TOPAMAX) 50 MG tablet, TAKE 1 TAB BY MOUTH TWICE DAILY .  Vitamin D, Ergocalciferol, (DRISDOL) 1.25 MG (50000 UT) CAPS capsule, Take 1 capsule (50,000 Units total) by mouth every 7 (seven) days.

## 2020-12-20 NOTE — Progress Notes (Signed)
Patient ID: Theresa Gibson, female   DOB: 1955-12-16, 65 y.o.   MRN: [id number]  Virtual Visit via Video Note  I connected with Theresa Gibson on 12/20/20 at 12:40 PM EST by a video enabled telemedicine application and verified that I am speaking with the correct person using two identifiers.  Location of all participants today Patient: at home Provider: at office   I discussed the limitations of evaluation and management by telemedicine and the availability of in person appointments. The patient expressed understanding and agreed to proceed.  History of Present Illness: Here with c/o onset URI symptoms jan 16 after exposed to [place] + daughter just prior, then tested + COVID yesterday at [facility] facility; Pt has had vaccination x 2 and booster; Symptoms include sinus congestion with yellowish d/c, HA, feverish at times, decreased smell (taste ok), dry cough, nausea and slight diarrhea.  No vomiting but just feels bad, feels tired. Pt denies chest pain, increased sob or doe, wheezing, orthopnea, PND, increased LE swelling, palpitations, dizziness or syncope.   Pt denies polydipsia, polyuria.  Denies worsening depressive symptoms, suicidal ideation, or panic.   Past Medical History:  Diagnosis Date  . Acute MI, inferior wall ([place]) 08/2016   s/p DES x 2 to the RCA  . Allergy   . Arthritis   . Depression   . GERD (gastroesophageal reflux disease)   . Hypertension   . Meningioma ([place])   . PONV (postoperative nausea and vomiting)    nausea only  . Wears dentures    upper   Past Surgical History:  Procedure Laterality Date  . ABDOMINAL HYSTERECTOMY     Total   . BREAST EXCISIONAL BIOPSY Left    approx 30+ years ago  . BREAST SURGERY     BX right breast  . COLONOSCOPY W/ BIOPSIES AND POLYPECTOMY    . CORONARY ANGIOPLASTY WITH STENT PLACEMENT    . KNEE SURGERY Bilateral    arthroscopy  . LAPAROSCOPIC APPENDECTOMY N/A 12/10/2015   Procedure: APPENDECTOMY LAPAROSCOPIC;  Surgeon: Erroll Luna, MD;  Location: [facility];  Service: General;  Laterality: N/A;  . MULTIPLE TOOTH EXTRACTIONS    . NASAL/SINUS ENDOSCOPY Bilateral 03/29/2017   ENDOSCOPIC SINUS SURGERY WITH NAVIGATION (Bilateral)  . SINUS ENDO W/FUSION Bilateral 03/29/2017   Procedure: ENDOSCOPIC SINUS SURGERY WITH NAVIGATION;  Surgeon: Melissa Montane, MD;  Location: [facility];  Service: ENT;  Laterality: Bilateral;  . TUBAL LIGATION    . TURBINATE REDUCTION Bilateral 03/29/2017   Procedure: TURBINATE REDUCTION;  Surgeon: Melissa Montane, MD;  Location: [facility];  Service: ENT;  Laterality: Bilateral;    reports that she quit smoking about 2 years ago. Her smoking use included cigarettes. She has a 21.00 pack-year smoking history. She has never used smokeless tobacco. She reports current alcohol use. She reports that she does not use drugs. family history includes Arthritis in her mother; Heart attack in her father; Hypertension in her mother; Ovarian cancer in her maternal grandmother. Allergies  Allergen Reactions  . Nicotine     Patch...caused a rash  . Ace Inhibitors Cough    REACTION: Dry cough-lisinopril    Current Outpatient Medications on File Prior to Visit  Medication Sig Dispense Refill  . acetaminophen (TYLENOL) 500 MG tablet Take 2 tablets (1,000 mg total) by mouth every 8 (eight) hours as needed for moderate pain. 30 tablet 0  . aspirin EC 81 MG tablet Take 81 mg by mouth daily.    Marland Kitchen atorvastatin (LIPITOR) 40 MG  tablet Take 1 tablet (40 mg total) by mouth daily at 6 PM. 90 tablet 3  . Blood Glucose Monitoring Suppl (ACCU-CHEK GUIDE ME) w/Device KIT Use as directed four times daily E11.9 1 kit 0  . buPROPion (WELLBUTRIN SR) 200 MG 12 hr tablet Take 200 mg by mouth 2 (two) times daily.    . Calcium Carbonate-Vitamin D 600-200 MG-UNIT TABS Take by mouth.    . carvedilol (COREG) 3.125 MG tablet Take 3.125 mg by mouth 2 (two) times daily with a meal.    . cetirizine (ZYRTEC) 10 MG tablet Take 10 mg by mouth daily as needed  for allergies.    . cholecalciferol (VITAMIN D) 1000 units tablet Take 1,000 Units by mouth daily.    . cyanocobalamin 1000 MCG tablet Take by mouth.    . diclofenac sodium (VOLTAREN) 1 % GEL Apply 4 g topically 4 (four) times daily. 100 g 1  . fexofenadine-pseudoephedrine (ALLEGRA-D 24) 180-240 MG 24 hr tablet Take 1 tablet by mouth daily.    . fluconazole (DIFLUCAN) 150 MG tablet 1 tab by mouth every 3 days as needed 2 tablet 1  . fluticasone (FLONASE) 50 MCG/ACT nasal spray Place 2 sprays into both nostrils daily. 16 g 0  . fluticasone (FLONASE) 50 MCG/ACT nasal spray Place 2 sprays into both nostrils daily. 9.9 mL 2  . glucose blood test strip Use to test blood sugar levels once a day. DX: E11.9..  Test strips for Accu-Chek Guide 100 each 12  . JARDIANCE 10 MG TABS tablet Take 10 mg by mouth daily.    Marland Kitchen ketoconazole (NIZORAL) 2 % cream APPLY  CREAM TOPICALLY TO VAGINAL AREA TWICE DAILY FOR 10 DAYS 45 g 0  . Lancets MISC Use as directed once daily E11.9 Accu-Chek Guide 100 each 3  . losartan (COZAAR) 50 MG tablet Take 1 tablet (50 mg total) by mouth daily. 90 tablet 3  . naproxen (NAPROSYN) 500 MG tablet TAKE 1 TABLET BY MOUTH TWICE DAILY WITH MEALS as needed 60 tablet 3  . nitroGLYCERIN (NITROSTAT) 0.4 MG SL tablet Place 0.4 mg under the tongue every 5 (five) minutes as needed for chest pain.    . pantoprazole (PROTONIX) 40 MG tablet Take 1 tablet (40 mg total) by mouth daily at 6 (six) AM. 30 tablet 3  . sodium chloride (OCEAN) 0.65 % SOLN nasal spray Place 1 spray into both nostrils as needed for congestion. 88 mL 0  . topiramate (TOPAMAX) 50 MG tablet TAKE 1 TAB BY MOUTH TWICE DAILY 180 tablet 0  . traMADol (ULTRAM) 50 MG tablet Take 1 tablet (50 mg total) by mouth every 6 (six) hours as needed. 15 tablet 0  . triamcinolone (NASACORT) 55 MCG/ACT AERO nasal inhaler Place 2 sprays into the nose daily. (Patient taking differently: Place 1-2 sprays into the nose daily as needed (NASAL  CONGESTION). )    . TRULICITY 1.77 LT/9.0ZE SOPN Inject once a week. 2 mL 2  . Vitamin D, Ergocalciferol, (DRISDOL) 1.25 MG (50000 UT) CAPS capsule Take 1 capsule (50,000 Units total) by mouth every 7 (seven) days. 12 capsule 0   No current facility-administered medications on file prior to visit.    Observations/Objective: Alert, NAD, appropriate mood and affect, resps normal, cn 2-12 intact, moves all 4s, no visible rash or swelling Lab Results  Component Value Date   WBC 7.4 01/08/2020   HGB 14.0 01/08/2020   HCT 43.2 01/08/2020   PLT 252.0 01/08/2020   GLUCOSE 80 06/28/2020  CHOL 156 06/28/2020   TRIG 77 06/28/2020   HDL 59 06/28/2020   LDLCALC 81 06/28/2020   ALT 17 06/28/2020   AST 14 06/28/2020   NA 139 06/28/2020   K 4.3 06/28/2020   CL 106 06/28/2020   CREATININE 0.77 06/28/2020   BUN 22 06/28/2020   CO2 24 06/28/2020   TSH 1.02 01/08/2020   INR 1.08 03/29/2017   HGBA1C 6.2 (H) 06/28/2020   MICROALBUR 1.2 01/08/2020   Assessment and Plan: See notes  Follow Up Instructions: See notes   I discussed the assessment and treatment plan with the patient. The patient was provided an opportunity to ask questions and all were answered. The patient agreed with the plan and demonstrated an understanding of the instructions.   The patient was advised to call back or seek an in-person evaluation if the symptoms worsen or if the condition fails to improve as anticipated.   Cathlean Cower, MD

## 2020-12-20 NOTE — Assessment & Plan Note (Signed)
vax and boosted; symptoms onset jan 16, test + jan 17, now with symptoms primarily mild to mod URI like; no sob.  Plans to get oximeter to test sats in the next wk for herself and daughter.  [place] for symptom tx with cough med prn, zofran prn, albuterol hfa prn, but also add decadron 6 mg daily for 10 days.  Pt is not candidate for monoclonal Ab due to prior vax and short supply.  Will add pt name to the treatment clinic for consideration for IV remdesivir but this also very limited supply.  D/w pt the natural hx of the disease and to expect possible sudden deterioration at 7-10 days or so if it were to happen, and to go to hospital right away if this occurs.

## 2020-12-20 NOTE — Patient Instructions (Signed)
Please take all new medication as prescribed 

## 2020-12-21 ENCOUNTER — Ambulatory Visit (HOSPITAL_COMMUNITY)
Admission: RE | Admit: 2020-12-21 | Discharge: 2020-12-21 | Disposition: A | Discharge status: Home / Self Care | Payer: 59 | Source: Ambulatory Visit | Attending: Pulmonary Disease | Admitting: Pulmonary Disease

## 2020-12-21 ENCOUNTER — Encounter (HOSPITAL_COMMUNITY): Payer: Self-pay

## 2020-12-21 DIAGNOSIS — U071 COVID-19: Secondary | ICD-10-CM | POA: Diagnosis present

## 2020-12-21 MED ORDER — FAMOTIDINE IN NACL 20-0.9 MG/50ML-% IV SOLN: 20.0000 mg | Freq: Once | INTRAVENOUS | Status: DC | PRN

## 2020-12-21 MED ORDER — METHYLPREDNISOLONE SODIUM SUCC 125 MG IJ SOLR: 125.0000 mg | Freq: Once | INTRAMUSCULAR | Status: DC | PRN

## 2020-12-21 MED ORDER — SODIUM CHLORIDE 0.9 % IV SOLN: INTRAVENOUS | Status: DC | PRN

## 2020-12-21 MED ORDER — SOTROVIMAB 500 MG/8ML IV SOLN
500.0000 mg | Freq: Once | INTRAVENOUS | Status: AC
  Administered 2020-12-21: 500 mg via INTRAVENOUS

## 2020-12-21 MED ORDER — DIPHENHYDRAMINE HCL 50 MG/ML IJ SOLN: 50.0000 mg | Freq: Once | INTRAMUSCULAR | Status: DC | PRN

## 2020-12-21 MED ORDER — EPINEPHRINE 0.3 MG/0.3ML IJ SOAJ: 0.3000 mg | Freq: Once | INTRAMUSCULAR | Status: DC | PRN

## 2020-12-21 MED ORDER — ALBUTEROL SULFATE HFA 108 (90 BASE) MCG/ACT IN AERS: 2.0000 | INHALATION_SPRAY | Freq: Once | RESPIRATORY_TRACT | Status: DC | PRN

## 2020-12-21 NOTE — Discharge Instructions (Signed)

## 2020-12-21 NOTE — Progress Notes (Signed)
Patient reviewed Fact Sheet for Patients, Parents, and Caregivers for Emergency Use Authorization (EUA) of sotrovimab for the Treatment of Coronavirus. Patient also reviewed and is agreeable to the estimated cost of treatment. Patient is agreeable to proceed.   

## 2020-12-21 NOTE — Progress Notes (Signed)
Diagnosis: COVID-19  Physician: Dr. Patrick Wright  Procedure: [facility] Infusion Clinic Med: Sotrovimab infusion - Provided patient with sotrovimab fact sheet for patients, parents, and caregivers prior to infusion.   Complications: No immediate complications noted  Discharge: Discharged home    

## 2021-01-11 ENCOUNTER — Telehealth: Payer: Self-pay | Admitting: Internal Medicine

## 2021-01-11 NOTE — Telephone Encounter (Signed)
Patient dropped off a disability parking placard. Form has been completed &Placed in providers box to review and sign if approved.

## 2021-01-12 NOTE — Telephone Encounter (Signed)
Form has been signed, Copy sent to scan.   LVM to inform patient the form is ready to be picked up. If she would like it mailed to her, I am happy to do so.

## 2021-01-26 ENCOUNTER — Encounter: Payer: Self-pay | Admitting: Internal Medicine

## 2021-01-26 ENCOUNTER — Other Ambulatory Visit: Payer: Self-pay

## 2021-01-26 ENCOUNTER — Ambulatory Visit (INDEPENDENT_AMBULATORY_CARE_PROVIDER_SITE_OTHER): Payer: 59 | Admitting: Internal Medicine

## 2021-01-26 VITALS — BP 140/78 | HR 86 | Temp 98.1°F | Ht 63.0 in | Wt 236.0 lb

## 2021-01-26 DIAGNOSIS — I10 Essential (primary) hypertension: Secondary | ICD-10-CM

## 2021-01-26 DIAGNOSIS — Z0001 Encounter for general adult medical examination with abnormal findings: Secondary | ICD-10-CM

## 2021-01-26 DIAGNOSIS — Z0000 Encounter for general adult medical examination without abnormal findings: Secondary | ICD-10-CM | POA: Diagnosis not present

## 2021-01-26 DIAGNOSIS — Z87891 Personal history of nicotine dependence: Secondary | ICD-10-CM | POA: Diagnosis not present

## 2021-01-26 DIAGNOSIS — E538 Deficiency of other specified B group vitamins: Secondary | ICD-10-CM

## 2021-01-26 DIAGNOSIS — Z23 Encounter for immunization: Secondary | ICD-10-CM | POA: Diagnosis not present

## 2021-01-26 DIAGNOSIS — E559 Vitamin D deficiency, unspecified: Secondary | ICD-10-CM

## 2021-01-26 DIAGNOSIS — F32A Depression, unspecified: Secondary | ICD-10-CM

## 2021-01-26 DIAGNOSIS — E119 Type 2 diabetes mellitus without complications: Secondary | ICD-10-CM | POA: Diagnosis not present

## 2021-01-26 DIAGNOSIS — F419 Anxiety disorder, unspecified: Secondary | ICD-10-CM

## 2021-01-26 DIAGNOSIS — E785 Hyperlipidemia, unspecified: Secondary | ICD-10-CM

## 2021-01-26 LAB — MICROALBUMIN / CREATININE URINE RATIO
Creatinine,U: 88.1 mg/dL
Microalb Creat Ratio: 1.1 mg/g (ref 0.0–30.0)
Microalb, Ur: 1 mg/dL (ref 0.0–1.9)

## 2021-01-26 LAB — BASIC METABOLIC PANEL
BUN: 13 mg/dL (ref 6–23)
CO2: 27 mEq/L (ref 19–32)
Calcium: 9.1 mg/dL (ref 8.4–10.5)
Chloride: 105 mEq/L (ref 96–112)
Creatinine, Ser: 0.69 mg/dL (ref 0.40–1.20)
GFR: 92.88 mL/min (ref >60.00)
Glucose, Bld: 97 mg/dL (ref 70–99)
Potassium: 3.9 mEq/L (ref 3.5–5.1)
Sodium: 138 mEq/L (ref 135–145)

## 2021-01-26 LAB — HEPATIC FUNCTION PANEL
ALT: 21 U/L (ref 0–35)
AST: 19 U/L (ref 0–37)
Albumin: 3.9 g/dL (ref 3.5–5.2)
Alkaline Phosphatase: 103 U/L (ref 39–117)
Bilirubin, Direct: 0.1 mg/dL (ref 0.0–0.3)
Total Bilirubin: 0.3 mg/dL (ref 0.2–1.2)
Total Protein: 6.9 g/dL (ref 6.0–8.3)

## 2021-01-26 LAB — CBC WITH DIFFERENTIAL/PLATELET
Basophils Absolute: 0 10*3/uL (ref 0.0–0.1)
Basophils Relative: 0.2 % (ref 0.0–3.0)
Eosinophils Absolute: 0.1 10*3/uL (ref 0.0–0.7)
Eosinophils Relative: 0.8 % (ref 0.0–5.0)
HCT: 40.8 % (ref 36.0–46.0)
Hemoglobin: 13.7 g/dL (ref 12.0–15.0)
Lymphocytes Relative: 28.7 % (ref 12.0–46.0)
Lymphs Abs: 2.3 10*3/uL (ref 0.7–4.0)
MCHC: 33.6 g/dL (ref 30.0–36.0)
MCV: 83.3 fl (ref 78.0–100.0)
Monocytes Absolute: 0.8 10*3/uL (ref 0.1–1.0)
Monocytes Relative: 10.2 % (ref 3.0–12.0)
Neutro Abs: 4.8 10*3/uL (ref 1.4–7.7)
Neutrophils Relative %: 60.1 % (ref 43.0–77.0)
Platelets: 292 10*3/uL (ref 150.0–400.0)
RBC: 4.89 Mil/uL (ref 3.87–5.11)
RDW: 16.3 % — ABNORMAL HIGH (ref 11.5–15.5)
WBC: 8 10*3/uL (ref 4.0–10.5)

## 2021-01-26 LAB — LIPID PANEL
Cholesterol: 114 mg/dL (ref 0–200)
HDL: 40.5 mg/dL (ref >39.00)
LDL Cholesterol: 60 mg/dL (ref 0–99)
NonHDL: 73.03
Total CHOL/HDL Ratio: 3
Triglycerides: 65 mg/dL (ref 0.0–149.0)
VLDL: 13 mg/dL (ref 0.0–40.0)

## 2021-01-26 LAB — URINALYSIS, ROUTINE W REFLEX MICROSCOPIC
Bilirubin Urine: NEGATIVE
Hgb urine dipstick: NEGATIVE
Ketones, ur: NEGATIVE
Nitrite: NEGATIVE
RBC / HPF: NONE SEEN (ref 0–?)
Specific Gravity, Urine: 1.02 (ref 1.000–1.030)
Total Protein, Urine: NEGATIVE
Urine Glucose: >=1000 — AB
Urobilinogen, UA: 0.2 (ref 0.0–1.0)
pH: 6 (ref 5.0–8.0)

## 2021-01-26 LAB — VITAMIN B12: Vitamin B-12: >1506 pg/mL — ABNORMAL HIGH (ref 211–911)

## 2021-01-26 LAB — HEMOGLOBIN A1C: Hgb A1c MFr Bld: 6.6 % — ABNORMAL HIGH (ref 4.6–6.5)

## 2021-01-26 LAB — VITAMIN D 25 HYDROXY (VIT D DEFICIENCY, FRACTURES): VITD: 26.67 ng/mL — ABNORMAL LOW (ref 30.00–100.00)

## 2021-01-26 LAB — TSH: TSH: 1.36 u[IU]/mL (ref 0.35–4.50)

## 2021-01-26 MED ORDER — BUPROPION HCL ER (SR) 150 MG PO TB12: 150.0000 mg | ORAL_TABLET | Freq: Two times a day (BID) | ORAL | 3 refills | Status: DC

## 2021-01-26 NOTE — Patient Instructions (Signed)
You had the Pneumovax pneumonia shot today  We can plan on the Tdap tetanus next time per your request  Ok to change the wellbutrin SR back to the 150 mg twice per day  Please continue all other medications as before, and refills have been done if requested.  Please have the pharmacy call with any other refills you may need.  Please continue your efforts at being more active, low cholesterol diet, and weight control.  You are otherwise up to date with prevention measures today.  Please keep your appointments with your specialists as you may have planned  Please go to the LAB at the blood drawing area for the tests to be done  You will be contacted by phone if any changes need to be made immediately.  Otherwise, you will receive a letter about your results with an explanation, but please check with MyChart first.  Please remember to sign up for MyChart if you have not done so, as this will be important to you in the future with finding out test results, communicating by private email, and scheduling acute appointments online when needed.  Please make an Appointment to return in 6 months, or sooner if needed

## 2021-01-26 NOTE — Progress Notes (Signed)
Patient ID: Theresa Gibson, female   DOB: 1956-03-28, 66 y.o.   MRN: [id number]         Chief Complaint:: wellness exam and anxiety/depression       HPI:  Theresa Gibson is a 65 y.o. female here for wellness exam; due for pneumovax but wants to hold tdap to next vist                  Also seen recently at [facility] with consitipation and cuasing elev diagphragm and sob. And now improved with miralax.   Also had covid infection jan 2022 from duaghter who lies with her, s/p MAB, made breathing worse, and scared her, so helped with staying away from smoking.  Has actually completley quit smoking x 2 mo, gained some weght despite trulicity and followed per [facility] clinic.  Could not tolerate the wellbutrin 200 bid with marked in cresaed apetite, jittery nervous so want to go back to the 150 bid.   Wt Readings from Last 3 Encounters:  01/26/21 236 lb (107 kg)  12/19/20 228 lb (103.4 kg)  12/03/20 228 lb (103.4 kg)   BP Readings from Last 3 Encounters:  01/26/21 140/78  12/21/20 125/75  12/19/20 (!) 124/59   Immunization History  Administered Date(s) Administered  . Hepatitis B 02/24/2007, 03/27/2007, 05/06/2007, 12/03/2007, 05/09/2008  . Influenza Whole 12/03/2007, 08/20/2008  . Influenza,inj,Quad PF,6+ Mos 09/07/2014, 11/19/2016, 09/01/2018, 09/07/2019  . Influenza,inj,quad, With Preservative 09/11/2017  . PFIZER(Purple Top)SARS-COV-2 Vaccination 12/20/2019, 01/20/2020, 09/16/2020  . Pneumococcal Conjugate-13 06/25/2019  . Pneumococcal Polysaccharide-23 01/26/2021  . Td 04/14/2008   Health Maintenance Due  Topic Date Due  . TETANUS/TDAP  04/14/2018  . OPHTHALMOLOGY EXAM  07/03/2019  . FOOT EXAM  06/24/2020      Past Medical History:  Diagnosis Date  . Acute MI, inferior wall ([place]) 08/2016   s/p DES x 2 to the RCA  . Allergy   . Arthritis   . Depression   . GERD (gastroesophageal reflux disease)   . Hypertension   . Meningioma ([place])   . PONV (postoperative nausea and vomiting)     nausea only  . Wears dentures    upper   Past Surgical History:  Procedure Laterality Date  . ABDOMINAL HYSTERECTOMY     Total   . BREAST EXCISIONAL BIOPSY Left    approx 30+ years ago  . BREAST SURGERY     BX right breast  . COLONOSCOPY W/ BIOPSIES AND POLYPECTOMY    . CORONARY ANGIOPLASTY WITH STENT PLACEMENT    . KNEE SURGERY Bilateral    arthroscopy  . LAPAROSCOPIC APPENDECTOMY N/A 12/10/2015   Procedure: APPENDECTOMY LAPAROSCOPIC;  Surgeon: Erroll Luna, MD;  Location: [place];  Service: General;  Laterality: N/A;  . MULTIPLE TOOTH EXTRACTIONS    . NASAL/SINUS ENDOSCOPY Bilateral 03/29/2017   ENDOSCOPIC SINUS SURGERY WITH NAVIGATION (Bilateral)  . SINUS ENDO W/FUSION Bilateral 03/29/2017   Procedure: ENDOSCOPIC SINUS SURGERY WITH NAVIGATION;  Surgeon: Melissa Montane, MD;  Location: [place];  Service: ENT;  Laterality: Bilateral;  . TUBAL LIGATION    . TURBINATE REDUCTION Bilateral 03/29/2017   Procedure: TURBINATE REDUCTION;  Surgeon: Melissa Montane, MD;  Location: [place];  Service: ENT;  Laterality: Bilateral;    reports that she quit smoking about 3 years ago. Her smoking use included cigarettes. She has a 21.00 pack-year smoking history. She has never used smokeless tobacco. She reports current alcohol use. She reports that she does not use drugs. family  history includes Arthritis in her mother; Heart attack in her father; Hypertension in her mother; Ovarian cancer in her maternal grandmother. Allergies  Allergen Reactions  . Nicotine     Patch...caused a rash  . Ace Inhibitors Cough    REACTION: Dry cough-lisinopril    Current Outpatient Medications on File Prior to Visit  Medication Sig Dispense Refill  . acetaminophen (TYLENOL) 500 MG tablet Take 2 tablets (1,000 mg total) by mouth every 8 (eight) hours as needed for moderate pain. 30 tablet 0  . albuterol (VENTOLIN HFA) 108 (90 Base) MCG/ACT inhaler INHALE 2 PUFFS BY MOUTH EVERY 4 TO 6 HOURS AS NEEDED FOR SHORTNESS OF BREATH  WHEEZING OR COUGHING 18 g 5  . aspirin EC 81 MG tablet Take 81 mg by mouth daily.    Marland Kitchen atorvastatin (LIPITOR) 40 MG tablet Take 1 tablet (40 mg total) by mouth daily at 6 PM. 90 tablet 3  . Blood Glucose Monitoring Suppl (ACCU-CHEK GUIDE ME) w/Device KIT Use as directed four times daily E11.9 1 kit 0  . Calcium Carbonate-Vitamin D 600-200 MG-UNIT TABS Take by mouth.    . carvedilol (COREG) 3.125 MG tablet Take 3.125 mg by mouth 2 (two) times daily with a meal.    . cetirizine (ZYRTEC) 10 MG tablet Take 10 mg by mouth daily as needed for allergies.    . cholecalciferol (VITAMIN D) 1000 units tablet Take 1,000 Units by mouth daily.    . cyanocobalamin 1000 MCG tablet Take by mouth.    . dexamethasone (DECADRON) 6 MG tablet Take 1 tablet (6 mg total) by mouth daily. 10 tablet 0  . diclofenac sodium (VOLTAREN) 1 % GEL Apply 4 g topically 4 (four) times daily. 100 g 1  . fexofenadine-pseudoephedrine (ALLEGRA-D 24) 180-240 MG 24 hr tablet Take 1 tablet by mouth daily.    . fluconazole (DIFLUCAN) 150 MG tablet 1 tab by mouth every 3 days as needed 2 tablet 1  . fluticasone (FLONASE) 50 MCG/ACT nasal spray Place 2 sprays into both nostrils daily. 16 g 0  . glucose blood test strip Use to test blood sugar levels once a day. DX: E11.9..  Test strips for Accu-Chek Guide 100 each 12  . JARDIANCE 10 MG TABS tablet Take 10 mg by mouth daily.    Marland Kitchen ketoconazole (NIZORAL) 2 % cream APPLY  CREAM TOPICALLY TO VAGINAL AREA TWICE DAILY FOR 10 DAYS 45 g 0  . Lancets MISC Use as directed once daily E11.9 Accu-Chek Guide 100 each 3  . losartan (COZAAR) 50 MG tablet Take 1 tablet (50 mg total) by mouth daily. 90 tablet 3  . naproxen (NAPROSYN) 500 MG tablet TAKE 1 TABLET BY MOUTH TWICE DAILY WITH MEALS as needed 60 tablet 3  . nitroGLYCERIN (NITROSTAT) 0.4 MG SL tablet Place 0.4 mg under the tongue every 5 (five) minutes as needed for chest pain.    Marland Kitchen ondansetron (ZOFRAN) 4 MG tablet Take 1 tablet (4 mg total) by  mouth every 8 (eight) hours as needed for nausea or vomiting. 20 tablet 1  . pantoprazole (PROTONIX) 40 MG tablet Take 1 tablet (40 mg total) by mouth daily at 6 (six) AM. 30 tablet 3  . sodium chloride (OCEAN) 0.65 % SOLN nasal spray Place 1 spray into both nostrils as needed for congestion. 88 mL 0  . topiramate (TOPAMAX) 50 MG tablet TAKE 1 TAB BY MOUTH TWICE DAILY 180 tablet 0  . traMADol (ULTRAM) 50 MG tablet Take 1 tablet (50 mg  total) by mouth every 6 (six) hours as needed. 15 tablet 0  . triamcinolone (NASACORT) 55 MCG/ACT AERO nasal inhaler Place 2 sprays into the nose daily. (Patient taking differently: Place 1-2 sprays into the nose daily as needed (NASAL CONGESTION).)    . TRULICITY 9.47 SJ/6.2EZ SOPN Inject once a week. 2 mL 2  . Vitamin D, Ergocalciferol, (DRISDOL) 1.25 MG (50000 UT) CAPS capsule Take 1 capsule (50,000 Units total) by mouth every 7 (seven) days. 12 capsule 0   No current facility-administered medications on file prior to visit.        ROS:  All others reviewed and negative.  Objective        PE:  BP 140/78   Pulse 86   Temp 98.1 F (36.7 C) (Oral)   Ht '5\' 3"'  (1.6 m)   Wt 236 lb (107 kg)   SpO2 97%   BMI 41.81 kg/m                 Constitutional: Pt appears in NAD               HENT: Head: NCAT.                Right Ear: External ear normal.                 Left Ear: External ear normal.                Eyes: . Pupils are equal, round, and reactive to light. Conjunctivae and EOM are normal               Nose: without d/c or deformity               Neck: Neck supple. Gross normal ROM               Cardiovascular: Normal rate and regular rhythm.                 Pulmonary/Chest: Effort normal and breath sounds without rales or wheezing.                Abd:  Soft, NT, ND, + BS, no organomegaly               Neurological: Pt is alert. At baseline orientation, motor grossly intact               Skin: Skin is warm. No rashes, no other new lesions, LE edema -  none               Psychiatric: Pt behavior is normal without agitation , nervous depressed  Micro: none  Cardiac tracings I have personally interpreted today:  none  Pertinent Radiological findings (summarize): none   Lab Results  Component Value Date   WBC 8.0 01/26/2021   HGB 13.7 01/26/2021   HCT 40.8 01/26/2021   PLT 292.0 01/26/2021   GLUCOSE 97 01/26/2021   CHOL 114 01/26/2021   TRIG 65.0 01/26/2021   HDL 40.50 01/26/2021   LDLCALC 60 01/26/2021   ALT 21 01/26/2021   AST 19 01/26/2021   NA 138 01/26/2021   K 3.9 01/26/2021   CL 105 01/26/2021   CREATININE 0.69 01/26/2021   BUN 13 01/26/2021   CO2 27 01/26/2021   TSH 1.36 01/26/2021   INR 1.08 03/29/2017   HGBA1C 6.6 (H) 01/26/2021   MICROALBUR 1.0 01/26/2021   Assessment/Plan:  Theresa Gibson is a 65 y.o. Black or African American [  2] female with  has a past medical history of Acute MI, inferior wall ([place]) (08/2016), Allergy, Arthritis, Depression, GERD (gastroesophageal reflux disease), Hypertension, Meningioma ([place]), PONV (postoperative nausea and vomiting), and Wears dentures.  Encounter for well adult exam with abnormal findings Age and sex appropriate education and counseling updated with regular exercise and diet Referrals for preventative services - none needed Immunizations addressed - for pneumovax, hold tdap to next visit Smoking counseling  - none needed Evidence for depression or other mood disorder - none significant Most recent labs reviewed. I have personally reviewed and have noted: 1) the patient's medical and social history 2) The patient's current medications and supplements 3) The patient's height, weight, and BMI have been recorded in the chart   Anxiety and depression Ok for change wellbutrin back to Sr 150 bid, declines referra counseling  Diabetes mellitus without complication ([place]) Lab Results  Component Value Date   HGBA1C 6.6 (H) 01/26/2021   Stable, pt to continue current  medical treatment jardiance, trulicity  Current Outpatient Medications (Endocrine & Metabolic):  .  dexamethasone (DECADRON) 6 MG tablet, Take 1 tablet (6 mg total) by mouth daily. Marland Kitchen  JARDIANCE 10 MG TABS tablet, Take 10 mg by mouth daily. .  TRULICITY 3.71 GG/2.6RS SOPN, Inject once a week.  Current Outpatient Medications (Cardiovascular):  .  atorvastatin (LIPITOR) 40 MG tablet, Take 1 tablet (40 mg total) by mouth daily at 6 PM. .  carvedilol (COREG) 3.125 MG tablet, Take 3.125 mg by mouth 2 (two) times daily with a meal. .  losartan (COZAAR) 50 MG tablet, Take 1 tablet (50 mg total) by mouth daily. .  nitroGLYCERIN (NITROSTAT) 0.4 MG SL tablet, Place 0.4 mg under the tongue every 5 (five) minutes as needed for chest pain.  Current Outpatient Medications (Respiratory):  .  albuterol (VENTOLIN HFA) 108 (90 Base) MCG/ACT inhaler, INHALE 2 PUFFS BY MOUTH EVERY 4 TO 6 HOURS AS NEEDED FOR SHORTNESS OF BREATH WHEEZING OR COUGHING .  cetirizine (ZYRTEC) 10 MG tablet, Take 10 mg by mouth daily as needed for allergies. .  fexofenadine-pseudoephedrine (ALLEGRA-D 24) 180-240 MG 24 hr tablet, Take 1 tablet by mouth daily. .  fluticasone (FLONASE) 50 MCG/ACT nasal spray, Place 2 sprays into both nostrils daily. .  fluticasone (FLONASE) 50 MCG/ACT nasal spray, Place 2 sprays into both nostrils daily. .  sodium chloride (OCEAN) 0.65 % SOLN nasal spray, Place 1 spray into both nostrils as needed for congestion. .  triamcinolone (NASACORT) 55 MCG/ACT AERO nasal inhaler, Place 2 sprays into the nose daily. (Patient taking differently: Place 1-2 sprays into the nose daily as needed (NASAL CONGESTION).)  Current Outpatient Medications (Analgesics):  .  acetaminophen (TYLENOL) 500 MG tablet, Take 2 tablets (1,000 mg total) by mouth every 8 (eight) hours as needed for moderate pain. Marland Kitchen  aspirin EC 81 MG tablet, Take 81 mg by mouth daily. .  naproxen (NAPROSYN) 500 MG tablet, TAKE 1 TABLET BY MOUTH TWICE DAILY  WITH MEALS as needed .  traMADol (ULTRAM) 50 MG tablet, Take 1 tablet (50 mg total) by mouth every 6 (six) hours as needed.  Current Outpatient Medications (Hematological):  .  cyanocobalamin 1000 MCG tablet, Take by mouth.  Current Outpatient Medications (Other):  .  Blood Glucose Monitoring Suppl (ACCU-CHEK GUIDE ME) w/Device KIT, Use as directed four times daily E11.9 .  buPROPion (WELLBUTRIN SR) 150 MG 12 hr tablet, Take 1 tablet (150 mg total) by mouth 2 (two) times daily. .  Calcium Carbonate-Vitamin  D 600-200 MG-UNIT TABS, Take by mouth. .  cholecalciferol (VITAMIN D) 1000 units tablet, Take 1,000 Units by mouth daily. .  diclofenac sodium (VOLTAREN) 1 % GEL, Apply 4 g topically 4 (four) times daily. .  fluconazole (DIFLUCAN) 150 MG tablet, 1 tab by mouth every 3 days as needed .  glucose blood test strip, Use to test blood sugar levels once a day. DX: E11.9..  Test strips for Accu-Chek Guide .  ketoconazole (NIZORAL) 2 % cream, APPLY  CREAM TOPICALLY TO VAGINAL AREA TWICE DAILY FOR 10 DAYS .  Lancets MISC, Use as directed once daily E11.9 Accu-Chek Guide .  ondansetron (ZOFRAN) 4 MG tablet, Take 1 tablet (4 mg total) by mouth every 8 (eight) hours as needed for nausea or vomiting. .  pantoprazole (PROTONIX) 40 MG tablet, Take 1 tablet (40 mg total) by mouth daily at 6 (six) AM. .  topiramate (TOPAMAX) 50 MG tablet, TAKE 1 TAB BY MOUTH TWICE DAILY .  Vitamin D, Ergocalciferol, (DRISDOL) 1.25 MG (50000 UT) CAPS capsule, Take 1 capsule (50,000 Units total) by mouth every 7 (seven) days.   Essential hypertension BP Readings from Last 3 Encounters:  01/26/21 140/78  12/21/20 125/75  12/19/20 (!) 124/59   Stable, pt to continue medical treatment  - coreg, cozaar  Former smoker Encouraged to continue abstienent  Hyperlipidemia with target LDL less than 70 Lab Results  Component Value Date   LDLCALC 60 01/26/2021   Stable, pt to continue current statin lipitor 40    Vitamin  D deficiency Last vitamin D Lab Results  Component Value Date   VD25OH 26.67 (L) 01/26/2021   Low, for start 2000 u vit d3 oral replacement  Followup: Return in about 6 months (around 07/26/2021).  Cathlean Cower, MD 01/29/2021 7:30 PM [facility]

## 2021-01-29 ENCOUNTER — Encounter: Payer: Self-pay | Admitting: Internal Medicine

## 2021-01-29 DIAGNOSIS — Z87891 Personal history of nicotine dependence: Secondary | ICD-10-CM | POA: Insufficient documentation

## 2021-01-29 NOTE — Assessment & Plan Note (Signed)
Encouraged to continue abstienent

## 2021-01-29 NOTE — Assessment & Plan Note (Signed)
Ok for change wellbutrin back to Sr 150 bid, declines referra counseling

## 2021-01-29 NOTE — Assessment & Plan Note (Signed)
BP Readings from Last 3 Encounters:  01/26/21 140/78  12/21/20 125/75  12/19/20 (!) 124/59   Stable, pt to continue medical treatment  - coreg, cozaar

## 2021-01-29 NOTE — Assessment & Plan Note (Signed)
Last vitamin D Lab Results  Component Value Date   VD25OH 26.67 (L) 01/26/2021   Low, for start 2000 u vit d3 oral replacement

## 2021-01-29 NOTE — Assessment & Plan Note (Signed)
Lab Results  Component Value Date   HGBA1C 6.6 (H) 01/26/2021   Stable, pt to continue current medical treatment jardiance, trulicity  Current Outpatient Medications (Endocrine & Metabolic):  .  dexamethasone (DECADRON) 6 MG tablet, Take 1 tablet (6 mg total) by mouth daily. Marland Kitchen  JARDIANCE 10 MG TABS tablet, Take 10 mg by mouth daily. .  TRULICITY 4.92 EF/0.0FH SOPN, Inject once a week.  Current Outpatient Medications (Cardiovascular):  .  atorvastatin (LIPITOR) 40 MG tablet, Take 1 tablet (40 mg total) by mouth daily at 6 PM. .  carvedilol (COREG) 3.125 MG tablet, Take 3.125 mg by mouth 2 (two) times daily with a meal. .  losartan (COZAAR) 50 MG tablet, Take 1 tablet (50 mg total) by mouth daily. .  nitroGLYCERIN (NITROSTAT) 0.4 MG SL tablet, Place 0.4 mg under the tongue every 5 (five) minutes as needed for chest pain.  Current Outpatient Medications (Respiratory):  .  albuterol (VENTOLIN HFA) 108 (90 Base) MCG/ACT inhaler, INHALE 2 PUFFS BY MOUTH EVERY 4 TO 6 HOURS AS NEEDED FOR SHORTNESS OF BREATH WHEEZING OR COUGHING .  cetirizine (ZYRTEC) 10 MG tablet, Take 10 mg by mouth daily as needed for allergies. .  fexofenadine-pseudoephedrine (ALLEGRA-D 24) 180-240 MG 24 hr tablet, Take 1 tablet by mouth daily. .  fluticasone (FLONASE) 50 MCG/ACT nasal spray, Place 2 sprays into both nostrils daily. .  fluticasone (FLONASE) 50 MCG/ACT nasal spray, Place 2 sprays into both nostrils daily. .  sodium chloride (OCEAN) 0.65 % SOLN nasal spray, Place 1 spray into both nostrils as needed for congestion. .  triamcinolone (NASACORT) 55 MCG/ACT AERO nasal inhaler, Place 2 sprays into the nose daily. (Patient taking differently: Place 1-2 sprays into the nose daily as needed (NASAL CONGESTION).)  Current Outpatient Medications (Analgesics):  .  acetaminophen (TYLENOL) 500 MG tablet, Take 2 tablets (1,000 mg total) by mouth every 8 (eight) hours as needed for moderate pain. Marland Kitchen  aspirin EC 81 MG tablet,  Take 81 mg by mouth daily. .  naproxen (NAPROSYN) 500 MG tablet, TAKE 1 TABLET BY MOUTH TWICE DAILY WITH MEALS as needed .  traMADol (ULTRAM) 50 MG tablet, Take 1 tablet (50 mg total) by mouth every 6 (six) hours as needed.  Current Outpatient Medications (Hematological):  .  cyanocobalamin 1000 MCG tablet, Take by mouth.  Current Outpatient Medications (Other):  .  Blood Glucose Monitoring Suppl (ACCU-CHEK GUIDE ME) w/Device KIT, Use as directed four times daily E11.9 .  buPROPion (WELLBUTRIN SR) 150 MG 12 hr tablet, Take 1 tablet (150 mg total) by mouth 2 (two) times daily. .  Calcium Carbonate-Vitamin D 600-200 MG-UNIT TABS, Take by mouth. .  cholecalciferol (VITAMIN D) 1000 units tablet, Take 1,000 Units by mouth daily. .  diclofenac sodium (VOLTAREN) 1 % GEL, Apply 4 g topically 4 (four) times daily. .  fluconazole (DIFLUCAN) 150 MG tablet, 1 tab by mouth every 3 days as needed .  glucose blood test strip, Use to test blood sugar levels once a day. DX: E11.9..  Test strips for Accu-Chek Guide .  ketoconazole (NIZORAL) 2 % cream, APPLY  CREAM TOPICALLY TO VAGINAL AREA TWICE DAILY FOR 10 DAYS .  Lancets MISC, Use as directed once daily E11.9 Accu-Chek Guide .  ondansetron (ZOFRAN) 4 MG tablet, Take 1 tablet (4 mg total) by mouth every 8 (eight) hours as needed for nausea or vomiting. .  pantoprazole (PROTONIX) 40 MG tablet, Take 1 tablet (40 mg total) by mouth daily at 6 (six)  AM. .  topiramate (TOPAMAX) 50 MG tablet, TAKE 1 TAB BY MOUTH TWICE DAILY .  Vitamin D, Ergocalciferol, (DRISDOL) 1.25 MG (50000 UT) CAPS capsule, Take 1 capsule (50,000 Units total) by mouth every 7 (seven) days.

## 2021-01-29 NOTE — Assessment & Plan Note (Signed)
Lab Results  Component Value Date   LDLCALC 60 01/26/2021   Stable, pt to continue current statin lipitor 40

## 2021-01-29 NOTE — Assessment & Plan Note (Signed)
Age and sex appropriate education and counseling updated with regular exercise and diet Referrals for preventative services - none needed Immunizations addressed - for pneumovax, hold tdap to next visit Smoking counseling  - none needed Evidence for depression or other mood disorder - none significant Most recent labs reviewed. I have personally reviewed and have noted: 1) the patient's medical and social history 2) The patient's current medications and supplements 3) The patient's height, weight, and BMI have been recorded in the chart

## 2021-01-31 ENCOUNTER — Telehealth: Payer: Self-pay | Admitting: Internal Medicine

## 2021-01-31 ENCOUNTER — Other Ambulatory Visit: Payer: Self-pay

## 2021-01-31 MED ORDER — JARDIANCE 10 MG PO TABS: 10.0000 mg | ORAL_TABLET | Freq: Every day | ORAL | 3 refills | Status: DC

## 2021-01-31 NOTE — Telephone Encounter (Signed)
Patient requesting refill for JARDIANCE [address] [place], [place].

## 2021-02-02 NOTE — Progress Notes (Signed)
I, Peterson Lombard, LAT, ATC acting as a scribe for Lynne Leader, MD.  Subjective:    CC: Bilateral knee pain  HPI: Pt is a 65 y/o female c/o bilat knee pain ongoing for years. Pt reports 2 prior surgeries on L knee and one surgeries on R knee. Pt was planning to have a TKR, but it was canceled due to the pandemic. Pt reports increased pain at night.  Pt locates pain to all over bilat knees. Pt reports she almost falls because of knee pain. Pt notes she is currently on disability because of knee pain.  She does go to a weight loss clinic run by [facility] here in [place] and is in the process of trying to lose weight.  She has had some weight gain when she quit smoking.  She had multiple steroid injections in her knees that have not worked very well at all recently.  Additionally she has had trials of gel shots that have not worked very well.  Radiates: yes- esp R and esp at night Knee mechanical symptoms: yes Aggravates: no moving, at night Treatments tried: Tylenol, naproxen  Pertinent review of Systems: No fevers or chills  Relevant historical information: Diabetes, history of myocardial infarction.  Morbid obesity.   Objective:    Vitals:   02/03/21 1000  BP: 126/84  Pulse: 80  SpO2: 98%    Body mass index is 41.84 kg/m.  General: Well Developed, well nourished, and in no acute distress.   MSK: Knees bilaterally normal motion however significant crepitation with range of motion.  Antalgic gait.  Lab and Radiology Results  X-ray images bilateral knees obtained today personally and independently interpreted  Right knee: Moderate to severe medial compartment DJD  Left knee: Severe medial compartment DJD.  Await formal radiology review   Impression and Recommendations:    Assessment and Plan: 65 y.o. female with severe DJD failing typical conservative management measures.  Patient has had trials of steroid injections and hyaluronic acid injections in the  past with little benefit recently.  I agree with her that repeat injections probably will not help very much and I think it is reasonable not to proceed with those now.  Discussed options for the future.  Ultimately she will need knee replacement however her BMI greater than 40 will preclude knee replacement for now.  Work diligently to lose weight as this will help with her knee pain and allow her to have a knee replacement in the future.  She already has a relationship with weight loss clinic encouraged her to reestablish and work towards weight loss.  If she needs some help happy to refer her to [facility] health healthy weight and wellness.  Quad strengthening will also help her knee pain.  Refer her to aquatic physical therapy.  Additionally discussed other possibilities including genicular nerve ablation.  She like to try other conservative management strategies before pursuing that.  Lastly recommend Voltaren gel.  Consider compression knee sleeves using body helix.  Check 6 weeks.Marland Kitchen  PDMP not reviewed this encounter. Orders Placed This Encounter  Procedures  . DG Knee AP/LAT W/Sunrise Left    Standing Status:   Future    Number of Occurrences:   1    Standing Expiration Date:   02/03/2022    Order Specific Question:   Reason for Exam (SYMPTOM  OR DIAGNOSIS REQUIRED)    Answer:   chronic bilateral knee pain    Order Specific Question:   Preferred imaging location?  Answer:   [facility]  . DG Knee AP/LAT W/Sunrise Right    Standing Status:   Future    Number of Occurrences:   1    Standing Expiration Date:   02/03/2022    Order Specific Question:   Reason for Exam (SYMPTOM  OR DIAGNOSIS REQUIRED)    Answer:   chronic bilateral knee pain    Order Specific Question:   Preferred imaging location?    Answer:   [facility]  . Ambulatory referral to Physical Therapy    Referral Priority:   Routine    Referral Type:   Physical Medicine    Referral Reason:   Specialty  Services Required    Requested Specialty:   Physical Therapy    Number of Visits Requested:   1   No orders of the defined types were placed in this encounter.   Discussed warning signs or symptoms. Please see discharge instructions. Patient expresses understanding.   The above documentation has been reviewed and is accurate and complete Lynne Leader, M.D.

## 2021-02-03 ENCOUNTER — Other Ambulatory Visit: Payer: Self-pay

## 2021-02-03 ENCOUNTER — Ambulatory Visit (INDEPENDENT_AMBULATORY_CARE_PROVIDER_SITE_OTHER): Payer: 59 | Admitting: Family Medicine

## 2021-02-03 ENCOUNTER — Ambulatory Visit (INDEPENDENT_AMBULATORY_CARE_PROVIDER_SITE_OTHER): Payer: 59

## 2021-02-03 ENCOUNTER — Ambulatory Visit: Payer: Self-pay

## 2021-02-03 VITALS — BP 126/84 | HR 80 | Ht 63.0 in | Wt 236.2 lb

## 2021-02-03 DIAGNOSIS — G8929 Other chronic pain: Secondary | ICD-10-CM | POA: Diagnosis not present

## 2021-02-03 DIAGNOSIS — M25562 Pain in left knee: Secondary | ICD-10-CM

## 2021-02-03 DIAGNOSIS — M25561 Pain in right knee: Secondary | ICD-10-CM

## 2021-02-03 NOTE — Patient Instructions (Addendum)
Thank you for coming in today.  I've referred you to Physical Therapy.  Let us know if you don't hear from them in one week.  Please get an Xray today before you leave  Please use voltaren gel up to 4x daily for pain as needed.   I recommend you obtained a compression sleeve to help with your joint problems. There are many options on the market however I recommend obtaining a full knee Body Helix compression sleeve.  You can find information (including how to appropriate measure yourself for sizing) can be found at www.Body http://www.lambert.com/.  Many of these products are health savings account (HSA) eligible.   You can use the compression sleeve at any time throughout the day but is most important to use while being active as well as for 2 hours post-activity.   It is appropriate to ice following activity with the compression sleeve in place.  Work on getting BMI less than 40 (goal for total knee replacement).   If you select a medicare advantage plan pick [employer]age (the [facility] one)  Recheck in 6 weeks.

## 2021-02-09 NOTE — Progress Notes (Signed)
X-ray left knee shows arthritis worse in the right knee

## 2021-02-09 NOTE — Progress Notes (Signed)
X-ray right knee shows arthritis

## 2021-03-09 ENCOUNTER — Ambulatory Visit (HOSPITAL_BASED_OUTPATIENT_CLINIC_OR_DEPARTMENT_OTHER): Payer: Medicare HMO | Attending: Family Medicine | Admitting: Physical Therapy

## 2021-03-09 ENCOUNTER — Other Ambulatory Visit: Payer: Self-pay

## 2021-03-09 DIAGNOSIS — M25561 Pain in right knee: Secondary | ICD-10-CM | POA: Insufficient documentation

## 2021-03-09 DIAGNOSIS — M25562 Pain in left knee: Secondary | ICD-10-CM | POA: Diagnosis not present

## 2021-03-09 DIAGNOSIS — G8929 Other chronic pain: Secondary | ICD-10-CM | POA: Insufficient documentation

## 2021-03-09 DIAGNOSIS — M6281 Muscle weakness (generalized): Secondary | ICD-10-CM | POA: Diagnosis not present

## 2021-03-09 DIAGNOSIS — R600 Localized edema: Secondary | ICD-10-CM | POA: Insufficient documentation

## 2021-03-09 DIAGNOSIS — R262 Difficulty in walking, not elsewhere classified: Secondary | ICD-10-CM | POA: Insufficient documentation

## 2021-03-10 ENCOUNTER — Encounter (HOSPITAL_BASED_OUTPATIENT_CLINIC_OR_DEPARTMENT_OTHER): Payer: Self-pay | Admitting: Physical Therapy

## 2021-03-10 ENCOUNTER — Encounter: Payer: Self-pay | Admitting: Internal Medicine

## 2021-03-10 DIAGNOSIS — E782 Mixed hyperlipidemia: Secondary | ICD-10-CM | POA: Diagnosis not present

## 2021-03-10 DIAGNOSIS — E669 Obesity, unspecified: Secondary | ICD-10-CM | POA: Diagnosis not present

## 2021-03-10 DIAGNOSIS — I10 Essential (primary) hypertension: Secondary | ICD-10-CM | POA: Diagnosis not present

## 2021-03-10 DIAGNOSIS — E119 Type 2 diabetes mellitus without complications: Secondary | ICD-10-CM | POA: Diagnosis not present

## 2021-03-10 MED ORDER — MELOXICAM 7.5 MG PO TABS: 7.5000 mg | ORAL_TABLET | Freq: Two times a day (BID) | ORAL | 2 refills | Status: DC | PRN

## 2021-03-10 NOTE — Therapy (Addendum)
[facility] [address] [place], [place], [address] Phone: [phone]   Fax:  [phone]  Physical Therapy Evaluation and Discharge  Patient Details  Name: Theresa Gibson MRN: [id number] Date of Birth: Nov 06, 1956 Referring Provider (PT): Gregor Hams, MD  PHYSICAL THERAPY DISCHARGE SUMMARY  Visits from Start of Care: 1  Current functional level related to goals / functional outcomes: See below   Remaining deficits: See below   Education / Equipment: See below  Plan: Patient agrees to discharge.  Patient goals were not met. Patient is being discharged due to not returning since initial evaluation.        Encounter Date: 03/09/2021   PT End of Session - 03/09/21 1024     Visit Number 1    Number of Visits 13    Date for PT Re-Evaluation 06/02/21    Authorization Type [employer] -- auth requested    Progress Note Due on Visit 10    PT Start Time 1020    PT Stop Time 1100    PT Time Calculation (min) 40 min    Activity Tolerance Patient tolerated treatment well    Behavior During Therapy WFL for tasks assessed/performed             Past Medical History:  Diagnosis Date   Acute MI, inferior wall ([place]) 08/2016   s/p DES x 2 to the RCA   Allergy    Arthritis    Depression    GERD (gastroesophageal reflux disease)    Hypertension    Meningioma (HCC)    PONV (postoperative nausea and vomiting)    nausea only   Wears dentures    upper    Past Surgical History:  Procedure Laterality Date   ABDOMINAL HYSTERECTOMY     Total    BREAST EXCISIONAL BIOPSY Left    approx 30+ years ago   BREAST SURGERY     BX right breast   COLONOSCOPY W/ BIOPSIES AND POLYPECTOMY     CORONARY ANGIOPLASTY WITH STENT PLACEMENT     KNEE SURGERY Bilateral    arthroscopy   LAPAROSCOPIC APPENDECTOMY N/A 12/10/2015   Procedure: APPENDECTOMY LAPAROSCOPIC;  Surgeon: Erroll Luna, MD;  Location: [facility];  Service: General;   Laterality: N/A;   MULTIPLE TOOTH EXTRACTIONS     NASAL/SINUS ENDOSCOPY Bilateral 03/29/2017   ENDOSCOPIC SINUS SURGERY WITH NAVIGATION (Bilateral)   SINUS ENDO W/FUSION Bilateral 03/29/2017   Procedure: ENDOSCOPIC SINUS SURGERY WITH NAVIGATION;  Surgeon: Melissa Montane, MD;  Location: [facility];  Service: ENT;  Laterality: Bilateral;   TUBAL LIGATION     TURBINATE REDUCTION Bilateral 03/29/2017   Procedure: TURBINATE REDUCTION;  Surgeon: Melissa Montane, MD;  Location: [facility];  Service: ENT;  Laterality: Bilateral;    There were no vitals filed for this visit.    Subjective Assessment - 03/09/21 1024     Subjective Pt with arthroscopic surgery x2 on R, x1 on L. Both knees arthritic and pt has goals to lose weight. Pt needs a knee replacement and hopes to get one in the future. [facility]. Pt feels most pain at night. Pt is stiff in the mornings getting up. In the afternoon it slows her down again. Pt has not been able to find sleeves that work for her knee type but it does help when she wears it. Pt has done aquatics before and enjoyed it (pt goes to the [facility] now).  Pertinent History MI, arthritis, depression, GERD, HTN, meningioma, DJD    Limitations Standing;Walking    How long can you sit comfortably? n/a    Patient Stated Goals Lose weight/exercise, gain more strength, prep for possible knee surgery    Currently in Pain? Yes    Pain Score 5     Pain Location Knee    Pain Orientation Left    Pain Descriptors / Indicators Dull;Burning    Pain Type Chronic pain    Pain Onset More than a month ago    Pain Frequency Intermittent    Aggravating Factors  Increased time standing and walking    Pain Relieving Factors Rest, knee sleeves    Effect of Pain on Daily Activities Work    Multiple Pain Sites Yes    Pain Score 6    Pain Location Knee    Pain Orientation Right    Pain Descriptors / Indicators Dull;Burning    Pain Type Chronic pain    Pain Onset More than a  month ago    Pain Frequency Intermittent    Aggravating Factors  same as above    Pain Relieving Factors same as above    Effect of Pain on Daily Activities same as above                [facility] PT Assessment - 03/10/21 0001       Assessment   Medical Diagnosis M25.561,M25.562,G89.29 (ICD-10-CM) - Bilateral chronic knee pain    Referring Provider (PT) Gregor Hams, MD    Prior Therapy PT after knee surgery (~20 years)      Restrictions   Weight Bearing Restrictions No      Balance Screen   Has the patient fallen in the past 6 months No      [place] residence    Living Arrangements Children    Available Help at Discharge Family    Type of [place] Access --   Steps pose a probem but none at her home   Home Layout One level      Prior Function   Level of Independence Independent    Vocation Part time employment    Engineer, mining; sitting with babies      Observation/Other Assessments   Focus on Therapeutic Outcomes (FOTO)  n/a      Functional Tests   Functional tests Squat;Single leg stance      Squat   Comments ~1/4 squat, increased weight shift to toes + knee flexion, narrow BOS      Single Leg Stance   Comments R LE: 5 sec; L LE: 4 sec   Limited due to pain     AROM   Right Knee Flexion 88    Left Knee Flexion 100      Strength   Right Hip Flexion 4/5    Right Hip Extension 4-/5    Right Hip External Rotation  4/5    Right Hip Internal Rotation 4/5    Right Hip ABduction 4-/5    Left Hip Flexion 3+/5    Left Hip Extension 4/5    Left Hip External Rotation 3+/5    Left Hip Internal Rotation 4/5    Left Hip ABduction 3+/5    Right Knee Flexion 3+/5    Right Knee Extension 4-/5   ~10 deg extensor lag with SLR   Left Knee Flexion 3+/5    Left Knee Extension 4-/5   ~  5 deg extensor leg with descent during SLR     Palpation   Patella mobility WFL                         Objective measurements completed on examination: See above findings.               PT Education - 03/10/21 0820     Education Details Discussed exam findings, POC, and HEP. Discussed squatting mechanics as this is an exercise pt is currently performing.    Person(s) Educated Patient    Methods Explanation;Demonstration;Handout    Comprehension Verbalized understanding;Returned demonstration;Tactile cues required              PT Short Term Goals - 03/10/21 0830       PT SHORT TERM GOAL #1   Title Pt will be independent with initial HEP    Time 3    Period Weeks    Status New    Target Date 03/31/21      PT SHORT TERM GOAL #2   Title Pt will have improved bilat knee ROM by at least 5 deg    Baseline knee flexion limited to 88 deg (R LE) and 100 deg (L LE)    Time 3    Period Weeks    Target Date 03/31/21      PT SHORT TERM GOAL #3   Title Pt will report decrease in pain by at least 25%    Baseline 5 and 6/10 pain currently    Time 3    Period Weeks    Status New    Target Date 03/31/21               PT Long Term Goals - 03/10/21 0832       PT LONG TERM GOAL #1   Title Pt will be independent with advanced HEP to perform at [facility]    Time 6    Period Weeks    Status New    Target Date 04/21/21      PT LONG TERM GOAL #2   Title Pt will be able to ascend/descend 5 steps utilizing reciprocal pattern    Baseline Currently uses step to pattern with L LE leading (per pt report)    Time 6    Period Weeks    Status New    Target Date 04/21/21      PT LONG TERM GOAL #3   Title Pt will report decrease in pain by at least 50%    Time 6    Period Weeks    Status New    Target Date 04/21/21      PT LONG TERM GOAL #4   Title Pt will be able to squat at least 25# to demo improved functional strength    Baseline Needs continued education for squat mechanics    Time 6    Period Weeks    Status New    Target Date 04/21/21                     Plan - 03/10/21 6759     Clinical Impression Statement Theresa Gibson is a 65 y/o F presenting to OPPT with bilateral chronic knee pain from arthritis (R worse than L). On assessment, pt demos decreased bilat LE strength (L weaker than R), decreased bilat knee ROM, pain, and edema affecting pt's general mobility for home and work. Pt would benefit from PT  to improve her mobility issues for improved comfort with prolonged standing, walking, and lifting tasks. Pt would also like continued education to prepare her for TKA in the future.    Personal Factors and Comorbidities Age;Time since onset of injury/illness/exacerbation;Past/Current Experience    Examination-Activity Limitations Bed Mobility;Locomotion Level;Bend;Squat;Stairs;Stand;Lift;Transfers    Examination-Participation Restrictions Cleaning;Community Activity;Driving;Occupation    Stability/Clinical Decision Making Evolving/Moderate complexity    Clinical Decision Making Moderate    Rehab Potential Good    PT Frequency --   1-2x/wk pending on pt's schedule   PT Duration 6 weeks    PT Treatment/Interventions ADLs/Self Care Home Management;Aquatic Therapy;Cryotherapy;Electrical Stimulation;Moist Heat;Iontophoresis 58m/ml Dexamethasone;Ultrasound;DME Instruction;Gait training;Stair training;Functional mobility training;Therapeutic activities;Therapeutic exercise;Balance training;Neuromuscular re-education;Patient/family education;Orthotic Fit/Training;Manual techniques;Passive range of motion;Dry needling;Taping    PT Next Visit Plan Assess response to HEP. Continue to improve knee ROM, quad strengthening, hip strengthening.    PT Home Exercise Plan Access Code [email or web address]   Consulted and Agree with Plan of Care Patient             Patient will benefit from skilled therapeutic intervention in order to improve the following deficits and impairments:  Abnormal gait,Decreased range of motion,Difficulty  walking,Obesity,Decreased endurance,Decreased activity tolerance,Pain,Decreased balance,Hypomobility,Decreased mobility,Decreased strength,Increased edema,Postural dysfunction  Visit Diagnosis: Difficulty in walking, not elsewhere classified  Chronic pain of left knee  Chronic pain of right knee  Muscle weakness (generalized)  Localized edema     Problem List Patient Active Problem List   Diagnosis Date Noted   Former smoker 01/29/2021   COVID-19 virus infection 12/20/2020   Sinus infection 11/09/2019   Encounter for well adult exam with abnormal findings 06/25/2019   Vaginitis 03/24/2019   Sensorineural hearing loss (SNHL), bilateral 11/06/2018   Tinnitus, bilateral 11/06/2018   Cough 11/06/2018   Laryngopharyngeal reflux (LPR) 11/06/2018   Chronic pain of both knees 07/28/2018   Presence of coronary angioplasty implant and graft 06/03/2018   Pain in right knee 03/13/2018   Diabetes mellitus without complication ([facility] 051/70/0174  Chronic sinusitis 03/29/2017   Atherosclerotic heart disease of native coronary artery without angina pectoris 03/06/2017   S/P drug eluting coronary stent placement 03/06/2017   Hypertrophy, nasal, turbinate 02/06/2017   Situational anxiety 11/19/2016   Myocardial infarction ([facility] 11/19/2016   Vitamin D deficiency 09/13/2015   Generalized headaches 04/21/2015   Osteoarthritis 04/21/2015   Decreased libido 09/21/2014   CARPAL TUNNEL SYNDROME, BILATERAL 12/03/2007   Hypersomnia with sleep apnea 06/24/2007   CAVERNOUS HEMANGIOMA 04/18/2007   Hyperlipidemia with target LDL less than 70 12/10/2006   BMI 40.0-44.9, adult ([facility] 12/10/2006   Anxiety and depression 12/10/2006   Essential hypertension 12/10/2006   Perennial allergic rhinitis 12/10/2006   GERD 12/10/2006    Theresa Gibson April MBeatrix ShipperNonato PT, DPT 03/10/2021, 8:39 AM  [place] [address]Phone:  [phone]  Fax:  [phone] Name: Theresa LIGHTCAPMRN: [id number] of Birth: 7Jul 12, 1957

## 2021-03-10 NOTE — Patient Instructions (Signed)
Access Code: [email or web address] URL: [email or web address] Date: 03/10/2021 Prepared by: Theresa Gibson  Exercises Supine Active Straight Leg Raise - 1 x daily - 7 x weekly - 2 sets - 10 reps Hooklying Clamshell with Resistance - 1 x daily - 7 x weekly - 2 sets - 10 reps Prone Hip Extension - 1 x daily - 7 x weekly - 2 sets - 10 reps

## 2021-03-16 ENCOUNTER — Ambulatory Visit (HOSPITAL_BASED_OUTPATIENT_CLINIC_OR_DEPARTMENT_OTHER): Payer: Medicare HMO | Admitting: Physical Therapy

## 2021-03-20 ENCOUNTER — Encounter (HOSPITAL_BASED_OUTPATIENT_CLINIC_OR_DEPARTMENT_OTHER): Payer: 59 | Admitting: Physical Therapy

## 2021-03-22 NOTE — Progress Notes (Deleted)
   I, Wendy Poet, LAT, ATC, am serving as scribe for Dr. Lynne Leader.  Theresa Gibson is a 65 y.o. female who presents to [facility] today for f/u of chronic B knee pain.  She has a hx of prior B knee surgeries.  She was last seen by Dr. Georgina Snell on 02/03/21 and was referred to PT of which she's completed one visit.  She was also advised to purchase knee compression sleeves and to use Voltaren gel.  Since her last visit, pt reports     Diagnostic imaging: R and L knee XR- 02/03/21  Pertinent review of systems: ***  Relevant historical information: ***   Exam:  There were no vitals taken for this visit. General: Well Developed, well nourished, and in no acute distress.   MSK: ***    Lab and Radiology Results No results found for this or any previous visit (from the past 72 hour(s)). No results found.     Assessment and Plan: 65 y.o. female with ***   PDMP not reviewed this encounter. No orders of the defined types were placed in this encounter.  No orders of the defined types were placed in this encounter.    Discussed warning signs or symptoms. Please see discharge instructions. Patient expresses understanding.   ***

## 2021-03-23 ENCOUNTER — Ambulatory Visit: Payer: 59 | Admitting: Family Medicine

## 2021-03-27 ENCOUNTER — Encounter (HOSPITAL_BASED_OUTPATIENT_CLINIC_OR_DEPARTMENT_OTHER): Payer: 59 | Admitting: Physical Therapy

## 2021-03-29 DIAGNOSIS — I2510 Atherosclerotic heart disease of native coronary artery without angina pectoris: Secondary | ICD-10-CM | POA: Diagnosis not present

## 2021-03-29 DIAGNOSIS — R0600 Dyspnea, unspecified: Secondary | ICD-10-CM | POA: Diagnosis not present

## 2021-03-29 DIAGNOSIS — E119 Type 2 diabetes mellitus without complications: Secondary | ICD-10-CM | POA: Diagnosis not present

## 2021-04-03 ENCOUNTER — Encounter (HOSPITAL_BASED_OUTPATIENT_CLINIC_OR_DEPARTMENT_OTHER): Payer: 59 | Admitting: Physical Therapy

## 2021-04-10 ENCOUNTER — Encounter (HOSPITAL_BASED_OUTPATIENT_CLINIC_OR_DEPARTMENT_OTHER): Payer: 59 | Admitting: Physical Therapy

## 2021-04-17 ENCOUNTER — Encounter (HOSPITAL_BASED_OUTPATIENT_CLINIC_OR_DEPARTMENT_OTHER): Payer: 59 | Admitting: Physical Therapy

## 2021-05-09 ENCOUNTER — Encounter: Payer: Self-pay | Admitting: Internal Medicine

## 2021-05-09 DIAGNOSIS — Z6841 Body Mass Index (BMI) 40.0 and over, adult: Secondary | ICD-10-CM | POA: Diagnosis not present

## 2021-05-09 DIAGNOSIS — E669 Obesity, unspecified: Secondary | ICD-10-CM | POA: Diagnosis not present

## 2021-05-09 DIAGNOSIS — I10 Essential (primary) hypertension: Secondary | ICD-10-CM | POA: Diagnosis not present

## 2021-05-09 DIAGNOSIS — E119 Type 2 diabetes mellitus without complications: Secondary | ICD-10-CM | POA: Diagnosis not present

## 2021-05-09 DIAGNOSIS — F419 Anxiety disorder, unspecified: Secondary | ICD-10-CM | POA: Diagnosis not present

## 2021-05-09 MED ORDER — SERTRALINE HCL 100 MG PO TABS: 100.0000 mg | ORAL_TABLET | Freq: Every day | ORAL | 3 refills | Status: DC

## 2021-07-01 ENCOUNTER — Encounter: Payer: Self-pay | Admitting: Internal Medicine

## 2021-07-03 MED ORDER — PANTOPRAZOLE SODIUM 40 MG PO TBEC: 40.0000 mg | DELAYED_RELEASE_TABLET | Freq: Every day | ORAL | 1 refills | Status: DC

## 2021-07-07 ENCOUNTER — Other Ambulatory Visit: Payer: Self-pay

## 2021-07-07 ENCOUNTER — Encounter: Payer: Self-pay | Admitting: Internal Medicine

## 2021-07-07 ENCOUNTER — Ambulatory Visit: Payer: Medicare PPO | Admitting: Internal Medicine

## 2021-07-07 VITALS — BP 124/78 | HR 75 | Temp 98.4°F | Ht 63.0 in | Wt 232.0 lb

## 2021-07-07 DIAGNOSIS — G4730 Sleep apnea, unspecified: Secondary | ICD-10-CM | POA: Diagnosis not present

## 2021-07-07 DIAGNOSIS — E559 Vitamin D deficiency, unspecified: Secondary | ICD-10-CM

## 2021-07-07 DIAGNOSIS — G4710 Hypersomnia, unspecified: Secondary | ICD-10-CM

## 2021-07-07 DIAGNOSIS — E119 Type 2 diabetes mellitus without complications: Secondary | ICD-10-CM

## 2021-07-07 DIAGNOSIS — K219 Gastro-esophageal reflux disease without esophagitis: Secondary | ICD-10-CM | POA: Diagnosis not present

## 2021-07-07 DIAGNOSIS — E538 Deficiency of other specified B group vitamins: Secondary | ICD-10-CM

## 2021-07-07 MED ORDER — FAMOTIDINE 20 MG PO TABS: 20.0000 mg | ORAL_TABLET | Freq: Two times a day (BID) | ORAL | 3 refills | Status: DC

## 2021-07-07 NOTE — Assessment & Plan Note (Signed)
With June 2022 A1c 6.2% -  Lab Results  Component Value Date   HGBA1C 6.6 (H) 01/26/2021   Stable, pt to continue current medical treatment jardiance, ozempic, metformin

## 2021-07-07 NOTE — Progress Notes (Signed)
Patient ID: Theresa Gibson, female   DOB: 11/14/1956, 65 y.o.   MRN: [id number]        Chief Complaint: follow up recent worsening fatigue with AM HA, low vit D, gerd       HPI:  Theresa Gibson is a 65 y.o. female here with chest discomfort and saw cardiology, who referred pt here for possible GI related discomfort. Has hx of GERD and HH,, currently on PPI, and last EGD about 30 yrs ago per pt.    Pain has been low SSCP mild intermittent for 2wks with bloating and gas feeling, no belching.  Denies worseniing dysphagia, n/v, bowel change or blood.Down 4 lbs since march.  Also with c/o ongoing fatigue during day, sleepiness, AM HA but not sure if snoring at night as she lives alone.  Also Mother with recent worsening multiple myeloma, more stress recently.  Pt denies other chest pain, increased sob or doe, wheezing, orthopnea, PND, increased LE swelling, palpitations, dizziness or syncope.  Has been taking low dose Vit D but lab still low recently.   Pt denies polydipsia, polyuria, or new focal neuro s/s.        Wt Readings from Last 3 Encounters:  07/07/21 232 lb (105.2 kg)  02/03/21 236 lb 3.2 oz (107.1 kg)  01/26/21 236 lb (107 kg)   BP Readings from Last 3 Encounters:  07/07/21 124/78  02/03/21 126/84  01/26/21 140/78         Past Medical History:  Diagnosis Date   Acute MI, inferior wall ([place]) 08/2016   s/p DES x 2 to the RCA   Allergy    Arthritis    Depression    GERD (gastroesophageal reflux disease)    Hypertension    Meningioma (HCC)    PONV (postoperative nausea and vomiting)    nausea only   Wears dentures    upper   Past Surgical History:  Procedure Laterality Date   ABDOMINAL HYSTERECTOMY     Total    BREAST EXCISIONAL BIOPSY Left    approx 30+ years ago   BREAST SURGERY     BX right breast   COLONOSCOPY W/ BIOPSIES AND POLYPECTOMY     CORONARY ANGIOPLASTY WITH STENT PLACEMENT     KNEE SURGERY Bilateral    arthroscopy   LAPAROSCOPIC APPENDECTOMY N/A 12/10/2015    Procedure: APPENDECTOMY LAPAROSCOPIC;  Surgeon: Erroll Luna, MD;  Location: [facility];  Service: General;  Laterality: N/A;   MULTIPLE TOOTH EXTRACTIONS     NASAL/SINUS ENDOSCOPY Bilateral 03/29/2017   ENDOSCOPIC SINUS SURGERY WITH NAVIGATION (Bilateral)   SINUS ENDO W/FUSION Bilateral 03/29/2017   Procedure: ENDOSCOPIC SINUS SURGERY WITH NAVIGATION;  Surgeon: Melissa Montane, MD;  Location: [facility];  Service: ENT;  Laterality: Bilateral;   TUBAL LIGATION     TURBINATE REDUCTION Bilateral 03/29/2017   Procedure: TURBINATE REDUCTION;  Surgeon: Melissa Montane, MD;  Location: [facility];  Service: ENT;  Laterality: Bilateral;    reports that she quit smoking about 3 years ago. Her smoking use included cigarettes. She has a 21.00 pack-year smoking history. She has never used smokeless tobacco. She reports current alcohol use. She reports that she does not use drugs. family history includes Arthritis in her mother; Heart attack in her father; Hypertension in her mother; Ovarian cancer in her maternal grandmother. Allergies  Allergen Reactions   Nicotine     Patch...caused a rash   Ace Inhibitors Cough    REACTION: Dry cough-lisinopril    Current Outpatient  Medications on File Prior to Visit  Medication Sig Dispense Refill   acetaminophen (TYLENOL) 500 MG tablet Take 2 tablets (1,000 mg total) by mouth every 8 (eight) hours as needed for moderate pain. 30 tablet 0   albuterol (VENTOLIN HFA) 108 (90 Base) MCG/ACT inhaler INHALE 2 PUFFS BY MOUTH EVERY 4 TO 6 HOURS AS NEEDED FOR SHORTNESS OF BREATH WHEEZING OR COUGHING 18 g 5   aspirin EC 81 MG tablet Take 81 mg by mouth daily.     atorvastatin (LIPITOR) 40 MG tablet Take 1 tablet (40 mg total) by mouth daily at 6 PM. 90 tablet 3   Blood Glucose Monitoring Suppl (ACCU-CHEK GUIDE ME) w/Device KIT Use as directed four times daily E11.9 1 kit 0   buPROPion (WELLBUTRIN SR) 150 MG 12 hr tablet Take 1 tablet (150 mg total) by mouth 2 (two) times daily. 180 tablet 3    carvedilol (COREG) 3.125 MG tablet Take 3.125 mg by mouth 2 (two) times daily with a meal.     cetirizine (ZYRTEC) 10 MG tablet Take 10 mg by mouth daily as needed for allergies.     cholecalciferol (VITAMIN D) 1000 units tablet Take 1,000 Units by mouth daily.     cyanocobalamin 1000 MCG tablet Take by mouth.     diclofenac sodium (VOLTAREN) 1 % GEL Apply 4 g topically 4 (four) times daily. 100 g 1   fluconazole (DIFLUCAN) 150 MG tablet 1 tab by mouth every 3 days as needed 2 tablet 1   fluticasone (FLONASE) 50 MCG/ACT nasal spray Place 2 sprays into both nostrils daily. 16 g 0   glucose blood test strip Use to test blood sugar levels once a day. DX: E11.9..  Test strips for Accu-Chek Guide 100 each 12   JARDIANCE 10 MG TABS tablet Take 1 tablet (10 mg total) by mouth daily. 30 tablet 3   ketoconazole (NIZORAL) 2 % cream APPLY  CREAM TOPICALLY TO VAGINAL AREA TWICE DAILY FOR 10 DAYS 45 g 0   Lancets MISC Use as directed once daily E11.9 Accu-Chek Guide 100 each 3   losartan (COZAAR) 50 MG tablet Take 1 tablet (50 mg total) by mouth daily. 90 tablet 3   losartan (COZAAR) 50 MG tablet Take 1 tablet by mouth daily.     meloxicam (MOBIC) 7.5 MG tablet Take 1 tablet (7.5 mg total) by mouth 2 (two) times daily as needed for pain. 60 tablet 2   naproxen (NAPROSYN) 500 MG tablet TAKE 1 TABLET BY MOUTH TWICE DAILY WITH MEALS as needed 60 tablet 3   nitroGLYCERIN (NITROSTAT) 0.4 MG SL tablet Place 0.4 mg under the tongue every 5 (five) minutes as needed for chest pain.     ondansetron (ZOFRAN) 4 MG tablet Take 1 tablet (4 mg total) by mouth every 8 (eight) hours as needed for nausea or vomiting. 20 tablet 1   pantoprazole (PROTONIX) 40 MG tablet Take 1 tablet (40 mg total) by mouth daily. 90 tablet 1   sodium chloride (OCEAN) 0.65 % SOLN nasal spray Place 1 spray into both nostrils as needed for congestion. 88 mL 0   traMADol (ULTRAM) 50 MG tablet Take 1 tablet (50 mg total) by mouth every 6 (six) hours  as needed. 15 tablet 0   triamcinolone (NASACORT) 55 MCG/ACT AERO nasal inhaler Place 2 sprays into the nose daily. (Patient taking differently: Place 1-2 sprays into the nose daily as needed (NASAL CONGESTION).)     OZEMPIC, 1 MG/DOSE, 4 MG/3ML SOPN  TRULICITY 4.40 NU/2.7OZ SOPN Inject once a week. 2 mL 2   Vitamin D, Ergocalciferol, (DRISDOL) 1.25 MG (50000 UT) CAPS capsule Take 1 capsule (50,000 Units total) by mouth every 7 (seven) days. (Patient not taking: Reported on 07/07/2021) 12 capsule 0   No current facility-administered medications on file prior to visit.        ROS:  All others reviewed and negative.  Objective        PE:  BP 124/78 (BP Location: Left Arm, Patient Position: Sitting, Cuff Size: Large)   Pulse 75   Temp 98.4 F (36.9 C) (Oral)   Ht '5\' 3"'  (1.6 m)   Wt 232 lb (105.2 kg)   SpO2 98%   BMI 41.10 kg/m                 Constitutional: Pt appears in NAD               HENT: Head: NCAT.                Right Ear: External ear normal.                 Left Ear: External ear normal.                Eyes: . Pupils are equal, round, and reactive to light. Conjunctivae and EOM are normal               Nose: without d/c or deformity               Neck: Neck supple. Gross normal ROM               Cardiovascular: Normal rate and regular rhythm.                 Pulmonary/Chest: Effort normal and breath sounds without rales or wheezing.                Abd:  Soft, NT, ND, + BS, no organomegaly               Neurological: Pt is alert. At baseline orientation, motor grossly intact               Skin: Skin is warm. No rashes, no other new lesions, LE edema - none               Psychiatric: Pt behavior is normal without agitation   Micro: none  Cardiac tracings I have personally interpreted today:  none  Pertinent Radiological findings (summarize): none   Lab Results  Component Value Date   WBC 8.0 01/26/2021   HGB 13.7 01/26/2021   HCT 40.8 01/26/2021   PLT 292.0  01/26/2021   GLUCOSE 97 01/26/2021   CHOL 114 01/26/2021   TRIG 65.0 01/26/2021   HDL 40.50 01/26/2021   LDLCALC 60 01/26/2021   ALT 21 01/26/2021   AST 19 01/26/2021   NA 138 01/26/2021   K 3.9 01/26/2021   CL 105 01/26/2021   CREATININE 0.69 01/26/2021   BUN 13 01/26/2021   CO2 27 01/26/2021   TSH 1.36 01/26/2021   INR 1.08 03/29/2017   HGBA1C 6.6 (H) 01/26/2021   MICROALBUR 1.0 01/26/2021   Assessment/Plan:  Theresa Gibson is a 64 y.o. Black or African American [2] female with  has a past medical history of Acute MI, inferior wall ([place]) (08/2016), Allergy, Arthritis, Depression, GERD (gastroesophageal reflux disease), Hypertension, Meningioma ([place]), PONV (postoperative nausea and vomiting), and  Wears dentures.  Diabetes mellitus without complication ([place]) With June 2022 A1c 6.2% -  Lab Results  Component Value Date   HGBA1C 6.6 (H) 01/26/2021   Stable, pt to continue current medical treatment jardiance, ozempic, metformin   Vitamin D deficiency Last vitamin D Lab Results  Component Value Date   VD25OH 26.67 (L) 01/26/2021   Low, to double her current oral replacement which she thinks may be 1000 u to 2000u  Hypersomnia with sleep apnea I suspect worsening osa - for home ONO and if abnormal pt will accept referral pulmonary for further eval and tx  Laryngopharyngeal reflux (LPR) Uncontrolled recent symptoms - for add pepcid 20 bid, refer GI per pt request, may need EGD  Followup: No follow-ups on file.  Cathlean Cower, MD 07/08/2021 5:09 PM [facility]

## 2021-07-07 NOTE — Assessment & Plan Note (Addendum)
Last vitamin D Lab Results  Component Value Date   VD25OH 26.67 (L) 01/26/2021   Low, to double her current oral replacement which she thinks may be 1000 u to 2000u

## 2021-07-07 NOTE — Patient Instructions (Addendum)
Please take OTC Vitamin D3 at 2000 units per day (or take twice what you take now)  indefinitely  You will be contacted regarding the referral for: overnight oximetry per home health  Please take all new medication as prescribed - the pepid 20 mg twice per day  You will be contacted regarding the referral for: Gastroenterology  Please continue all other medications as before, and refills have been done if requested.  Please have the pharmacy call with any other refills you may need.  Please continue your efforts at being more active, low cholesterol diet, and weight control.  You are otherwise up to date with prevention measures today.  Please keep your appointments with your specialists as you may have planned  Please make an Appointment to return in 6 months, or sooner if needed, also with Lab Appointment for testing done 3-5 days before at the [facility] (so this is for TWO appointments - please see the scheduling desk as you leave)  Due to the ongoing Covid 19 pandemic, our lab now requires an appointment for any labs done at our office.  If you need labs done and do not have an appointment, please call our office ahead of time to schedule before presenting to the lab for your testing.

## 2021-07-08 NOTE — Assessment & Plan Note (Signed)
Uncontrolled recent symptoms - for add pepcid 20 bid, refer GI per pt request, may need EGD

## 2021-07-08 NOTE — Assessment & Plan Note (Signed)
I suspect worsening osa - for home ONO and if abnormal pt will accept referral pulmonary for further eval and tx

## 2021-08-03 ENCOUNTER — Other Ambulatory Visit: Payer: Self-pay | Admitting: Internal Medicine

## 2021-08-03 ENCOUNTER — Encounter: Payer: Self-pay | Admitting: Internal Medicine

## 2021-08-03 MED ORDER — MELOXICAM 7.5 MG PO TABS: 7.5000 mg | ORAL_TABLET | Freq: Two times a day (BID) | ORAL | 3 refills | Status: DC | PRN

## 2021-08-05 ENCOUNTER — Encounter (HOSPITAL_BASED_OUTPATIENT_CLINIC_OR_DEPARTMENT_OTHER): Payer: Self-pay | Admitting: Emergency Medicine

## 2021-08-05 ENCOUNTER — Emergency Department (HOSPITAL_BASED_OUTPATIENT_CLINIC_OR_DEPARTMENT_OTHER)
Admission: EM | Admit: 2021-08-05 | Discharge: 2021-08-05 | Disposition: A | Discharge status: Home / Self Care | Payer: Medicare PPO | Attending: Emergency Medicine | Admitting: Emergency Medicine

## 2021-08-05 ENCOUNTER — Other Ambulatory Visit: Payer: Self-pay

## 2021-08-05 DIAGNOSIS — Z79899 Other long term (current) drug therapy: Secondary | ICD-10-CM | POA: Diagnosis not present

## 2021-08-05 DIAGNOSIS — Z7982 Long term (current) use of aspirin: Secondary | ICD-10-CM | POA: Diagnosis not present

## 2021-08-05 DIAGNOSIS — I10 Essential (primary) hypertension: Secondary | ICD-10-CM | POA: Diagnosis not present

## 2021-08-05 DIAGNOSIS — M79671 Pain in right foot: Secondary | ICD-10-CM | POA: Diagnosis present

## 2021-08-05 DIAGNOSIS — M7661 Achilles tendinitis, right leg: Secondary | ICD-10-CM | POA: Diagnosis not present

## 2021-08-05 DIAGNOSIS — E119 Type 2 diabetes mellitus without complications: Secondary | ICD-10-CM | POA: Diagnosis not present

## 2021-08-05 DIAGNOSIS — Z8616 Personal history of COVID-19: Secondary | ICD-10-CM | POA: Diagnosis not present

## 2021-08-05 DIAGNOSIS — Z7984 Long term (current) use of oral hypoglycemic drugs: Secondary | ICD-10-CM | POA: Insufficient documentation

## 2021-08-05 DIAGNOSIS — Z87891 Personal history of nicotine dependence: Secondary | ICD-10-CM | POA: Diagnosis not present

## 2021-08-05 NOTE — Discharge Instructions (Addendum)
You have been given the cam walker to help with the pain.  Follow-up with podiatry on Friday as planned.

## 2021-08-05 NOTE — ED Provider Notes (Signed)
[facility] EMERGENCY DEPARTMENT Provider Note   CSN: [id number] Arrival date & time: 08/05/21  1035     History Chief Complaint  Patient presents with   Foot Pain    Theresa Gibson is a 65 y.o. female.   Foot Pain Pertinent negatives include no shortness of breath. Patient presents with right heel pain.  States has had for the last 2 to 3 weeks.  Worse with walking.  Worse to the point that she has limited the walking.  Has a history of plantar fasciitis but states this feels different.  States it is in the back of her foot/heel.  No trauma.  Has an appointment with podiatry on Friday.  States the pain will come and go.  States with walking or get worse and rest improves.     Past Medical History:  Diagnosis Date   Acute MI, inferior wall ([place]) 08/2016   s/p DES x 2 to the RCA   Allergy    Arthritis    Depression    GERD (gastroesophageal reflux disease)    Hypertension    Meningioma (HCC)    PONV (postoperative nausea and vomiting)    nausea only   Wears dentures    upper    Patient Active Problem List   Diagnosis Date Noted   Former smoker 01/29/2021   COVID-19 virus infection 12/20/2020   Sinus infection 11/09/2019   Encounter for well adult exam with abnormal findings 06/25/2019   Vaginitis 03/24/2019   Sensorineural hearing loss (SNHL), bilateral 11/06/2018   Tinnitus, bilateral 11/06/2018   Cough 11/06/2018   Laryngopharyngeal reflux (LPR) 11/06/2018   Chronic pain of both knees 07/28/2018   Presence of coronary angioplasty implant and graft 06/03/2018   Pain in right knee 03/13/2018   Diabetes mellitus without complication ([place]) 09/26/8526   Chronic sinusitis 03/29/2017   Atherosclerotic heart disease of native coronary artery without angina pectoris 03/06/2017   S/P drug eluting coronary stent placement 03/06/2017   Hypertrophy, nasal, turbinate 02/06/2017   Situational anxiety 11/19/2016   Myocardial infarction ([place]) 11/19/2016   Vitamin  D deficiency 09/13/2015   Generalized headaches 04/21/2015   Osteoarthritis 04/21/2015   Decreased libido 09/21/2014   CARPAL TUNNEL SYNDROME, BILATERAL 12/03/2007   Hypersomnia with sleep apnea 06/24/2007   CAVERNOUS HEMANGIOMA 04/18/2007   Hyperlipidemia with target LDL less than 70 12/10/2006   BMI 40.0-44.9, adult ([place]) 12/10/2006   Anxiety and depression 12/10/2006   Essential hypertension 12/10/2006   Perennial allergic rhinitis 12/10/2006   GERD 12/10/2006    Past Surgical History:  Procedure Laterality Date   ABDOMINAL HYSTERECTOMY     Total    BREAST EXCISIONAL BIOPSY Left    approx 30+ years ago   BREAST SURGERY     BX right breast   COLONOSCOPY W/ BIOPSIES AND POLYPECTOMY     CORONARY ANGIOPLASTY WITH STENT PLACEMENT     KNEE SURGERY Bilateral    arthroscopy   LAPAROSCOPIC APPENDECTOMY N/A 12/10/2015   Procedure: APPENDECTOMY LAPAROSCOPIC;  Surgeon: Erroll Luna, MD;  Location: [facility];  Service: General;  Laterality: N/A;   MULTIPLE TOOTH EXTRACTIONS     NASAL/SINUS ENDOSCOPY Bilateral 03/29/2017   ENDOSCOPIC SINUS SURGERY WITH NAVIGATION (Bilateral)   SINUS ENDO W/FUSION Bilateral 03/29/2017   Procedure: ENDOSCOPIC SINUS SURGERY WITH NAVIGATION;  Surgeon: Melissa Montane, MD;  Location: [facility] OR;  Service: ENT;  Laterality: Bilateral;   TUBAL LIGATION     TURBINATE REDUCTION Bilateral 03/29/2017   Procedure: TURBINATE REDUCTION;  Surgeon: Melissa Montane, MD;  Location: [facility];  Service: ENT;  Laterality: Bilateral;     OB History   No obstetric history on file.     Family History  Problem Relation Age of Onset   Hypertension Mother    Arthritis Mother    Heart attack Father        In his 60's   Ovarian cancer Maternal Grandmother    Colon cancer Neg Hx    Colon polyps Neg Hx    Rectal cancer Neg Hx    Stomach cancer Neg Hx    Breast cancer Neg Hx     Social History   Tobacco Use   Smoking status: Former    Packs/day: 0.50    Years: 42.00    Pack years:  21.00    Types: Cigarettes    Quit date: 01/03/2018    Years since quitting: 3.5   Smokeless tobacco: Never  Vaping Use   Vaping Use: Never used  Substance Use Topics   Alcohol use: Yes    Alcohol/week: 0.0 standard drinks    Comment: occ   Drug use: No    Home Medications Prior to Admission medications   Medication Sig Start Date End Date Taking? Authorizing Provider  acetaminophen (TYLENOL) 500 MG tablet Take 2 tablets (1,000 mg total) by mouth every 8 (eight) hours as needed for moderate pain. 09/30/17   Nche, Charlene Brooke, NP  albuterol (VENTOLIN HFA) 108 (90 Base) MCG/ACT inhaler INHALE 2 PUFFS BY MOUTH EVERY 4 TO 6 HOURS AS NEEDED FOR SHORTNESS OF BREATH WHEEZING OR COUGHING 12/20/20   Biagio Borg, MD  aspirin EC 81 MG tablet Take 81 mg by mouth daily.    [provider]  atorvastatin (LIPITOR) 40 MG tablet Take 1 tablet (40 mg total) by mouth daily at 6 PM. 01/22/20   Biagio Borg, MD  Blood Glucose Monitoring Suppl (ACCU-CHEK GUIDE ME) w/Device KIT Use as directed four times daily E11.9 10/04/20   Biagio Borg, MD  buPROPion [facility] SR) 150 MG 12 hr tablet Take 1 tablet (150 mg total) by mouth 2 (two) times daily. 01/26/21   Biagio Borg, MD  carvedilol (COREG) 3.125 MG tablet Take 3.125 mg by mouth 2 (two) times daily with a meal.    [provider]  cetirizine (ZYRTEC) 10 MG tablet Take 10 mg by mouth daily as needed for allergies.    [provider]  cholecalciferol (VITAMIN D) 1000 units tablet Take 1,000 Units by mouth daily.    [provider]  cyanocobalamin 1000 MCG tablet Take by mouth.    [provider]  diclofenac sodium (VOLTAREN) 1 % GEL Apply 4 g topically 4 (four) times daily. 07/28/18   Lance Sell, NP  famotidine (PEPCID) 20 MG tablet Take 1 tablet (20 mg total) by mouth 2 (two) times daily. 07/07/21   Biagio Borg, MD  fluconazole (DIFLUCAN) 150 MG tablet 1 tab by mouth every 3 days as needed 06/28/20    Biagio Borg, MD  fluticasone [facility]) 50 MCG/ACT nasal spray Place 2 sprays into both nostrils daily. 12/20/17   Palumbo, April, MD  glucose blood test strip Use to test blood sugar levels once a day. DX: E11.9..  Test strips for Accu-Chek Guide 10/14/20   Biagio Borg, MD  JARDIANCE 10 MG TABS tablet Take 1 tablet (10 mg total) by mouth daily. 01/31/21   Biagio Borg, MD  ketoconazole (NIZORAL) 2 %  cream APPLY  CREAM TOPICALLY TO VAGINAL AREA TWICE DAILY FOR 10 DAYS 03/07/20   Plotnikov, Evie Lacks, MD  Lancets MISC Use as directed once daily E11.9 Accu-Chek Guide 10/14/20   Biagio Borg, MD  losartan (COZAAR) 50 MG tablet Take 1 tablet (50 mg total) by mouth daily. 07/28/18   Lance Sell, NP  losartan (COZAAR) 50 MG tablet Take 1 tablet by mouth daily. 05/22/21   [provider]  meloxicam (MOBIC) 7.5 MG tablet Take 1 tablet (7.5 mg total) by mouth 2 (two) times daily as needed. for pain 08/03/21   Biagio Borg, MD  naproxen (NAPROSYN) 500 MG tablet TAKE 1 TABLET BY MOUTH TWICE DAILY WITH MEALS as needed 02/16/20   Biagio Borg, MD  nitroGLYCERIN (NITROSTAT) 0.4 MG SL tablet Place 0.4 mg under the tongue every 5 (five) minutes as needed for chest pain.    [provider]  ondansetron (ZOFRAN) 4 MG tablet Take 1 tablet (4 mg total) by mouth every 8 (eight) hours as needed for nausea or vomiting. 12/20/20   Biagio Borg, MD  OZEMPIC, 1 MG/DOSE, 4 MG/3ML [facility]  04/04/21   [provider]  pantoprazole (PROTONIX) 40 MG tablet Take 1 tablet (40 mg total) by mouth daily. 07/03/21   Biagio Borg, MD  sodium chloride (OCEAN) 0.65 % SOLN nasal spray Place 1 spray into both nostrils as needed for congestion. 12/19/20   Horton, Barbette Hair, MD  traMADol (ULTRAM) 50 MG tablet Take 1 tablet (50 mg total) by mouth every 6 (six) hours as needed. 06/26/18   Ashley Murrain, NP  triamcinolone (NASACORT) 55 MCG/ACT AERO nasal inhaler Place 2 sprays into the nose daily. Patient taking  differently: Place 1-2 sprays into the nose daily as needed (NASAL CONGESTION). 01/04/17   Molpus, John, MD  TRULICITY 8.11 XB/2.6OM SOPN Inject once a week. 07/28/20   Biagio Borg, MD  Vitamin D, Ergocalciferol, (DRISDOL) 1.25 MG (50000 UT) CAPS capsule Take 1 capsule (50,000 Units total) by mouth every 7 (seven) days. Patient not taking: Reported on 07/07/2021 06/25/19   Biagio Borg, MD    Allergies    Nicotine and Ace inhibitors  Review of Systems   Review of Systems  Constitutional:  Negative for appetite change and fever.  Respiratory:  Negative for shortness of breath.   Musculoskeletal:        Right heel pain.  Skin:  Negative for wound.  Neurological:  Negative for weakness and numbness.  Psychiatric/Behavioral:  Negative for confusion.    Physical Exam Updated Vital Signs BP (!) 152/76   Pulse 74   Temp 97.6 F (36.4 C)   Resp 18   Ht _0  (1.6 m)   Wt 103.4 kg   SpO2 100%   BMI 40.39 kg/m   Physical Exam Vitals and nursing note reviewed.  HENT:     Head: Normocephalic.  Musculoskeletal:        General: Tenderness present.     Cervical back: Neck supple.     Comments: Mild tenderness over right Achilles.  No deformity.  Good flexion extension.  No tenderness over calf.  No tenderness over calcaneus.  Skin intact.  No erythema.  Skin:    Capillary Refill: Capillary refill takes less than 2 seconds.  Neurological:     Mental Status: She is alert and oriented to person, place, and time.    ED Results / Procedures / Treatments   Labs (all labs  ordered are listed, but only abnormal results are displayed) Labs Reviewed - No data to display  EKG None  Radiology No results found.  Procedures Procedures   Medications Ordered in ED Medications - No data to display  ED Course  I have reviewed the triage vital signs and the nursing notes.  Pertinent labs & imaging results that were available during my care of the patient were reviewed by me and considered  in my medical decision making (see chart for details).    MDM Rules/Calculators/A&P                           Patient with pain in right Achilles area.  Began 2 to 3 weeks ago.  Worse with walking.  Minimal tenderness with palpation.  No deformity.  Good flexion extension and full strength.  Do not feel I need imaging at this time.  Has follow-up with podiatry on Friday.  CAM Walker given help control the ankle.  Discharge home. Final Clinical Impression(s) / ED Diagnoses Final diagnoses:  Achilles tendinitis of right lower extremity    Rx / DC Orders ED Discharge Orders     None        Davonna Belling, MD 08/05/21 1109

## 2021-08-05 NOTE — ED Triage Notes (Signed)
Pt to ER with c/o right heel pain for last 2-3 weeks.  Pt denies injury, states she tried wrapping and elevating and pain became worse.  Pt states appointment with "foot doctor" next Friday, but she is unable to wait that long.

## 2021-08-11 DIAGNOSIS — M7752 Other enthesopathy of left foot: Secondary | ICD-10-CM | POA: Diagnosis not present

## 2021-08-11 DIAGNOSIS — M7751 Other enthesopathy of right foot: Secondary | ICD-10-CM | POA: Diagnosis not present

## 2021-08-11 DIAGNOSIS — M7662 Achilles tendinitis, left leg: Secondary | ICD-10-CM | POA: Diagnosis not present

## 2021-08-11 DIAGNOSIS — M79671 Pain in right foot: Secondary | ICD-10-CM | POA: Diagnosis not present

## 2021-08-11 DIAGNOSIS — M79672 Pain in left foot: Secondary | ICD-10-CM | POA: Diagnosis not present

## 2021-08-11 DIAGNOSIS — M71571 Other bursitis, not elsewhere classified, right ankle and foot: Secondary | ICD-10-CM | POA: Diagnosis not present

## 2021-08-11 DIAGNOSIS — M7661 Achilles tendinitis, right leg: Secondary | ICD-10-CM | POA: Diagnosis not present

## 2021-08-11 DIAGNOSIS — M71572 Other bursitis, not elsewhere classified, left ankle and foot: Secondary | ICD-10-CM | POA: Diagnosis not present

## 2021-08-30 ENCOUNTER — Other Ambulatory Visit: Payer: Self-pay | Admitting: Internal Medicine

## 2021-08-30 DIAGNOSIS — Z1231 Encounter for screening mammogram for malignant neoplasm of breast: Secondary | ICD-10-CM

## 2021-09-01 ENCOUNTER — Ambulatory Visit: Payer: Medicare PPO

## 2021-09-05 ENCOUNTER — Encounter: Payer: Self-pay | Admitting: Internal Medicine

## 2021-09-06 ENCOUNTER — Other Ambulatory Visit: Payer: Self-pay

## 2021-09-06 MED ORDER — ATORVASTATIN CALCIUM 40 MG PO TABS: 40.0000 mg | ORAL_TABLET | Freq: Every day | ORAL | 3 refills | Status: DC

## 2021-09-06 NOTE — Telephone Encounter (Signed)
Medication refilled, sent pt my chart message to inform her.

## 2021-09-06 NOTE — Progress Notes (Signed)
Refilled medication

## 2021-10-06 ENCOUNTER — Ambulatory Visit: Payer: Medicare PPO

## 2021-10-10 ENCOUNTER — Ambulatory Visit: Payer: Medicare PPO | Admitting: Internal Medicine

## 2021-10-16 ENCOUNTER — Other Ambulatory Visit: Payer: Self-pay

## 2021-10-16 ENCOUNTER — Encounter: Payer: Self-pay | Admitting: Internal Medicine

## 2021-10-16 ENCOUNTER — Ambulatory Visit (INDEPENDENT_AMBULATORY_CARE_PROVIDER_SITE_OTHER): Payer: Medicare PPO | Admitting: Internal Medicine

## 2021-10-16 VITALS — BP 110/70 | HR 69 | Temp 97.8°F | Ht 63.0 in | Wt 239.0 lb

## 2021-10-16 DIAGNOSIS — E559 Vitamin D deficiency, unspecified: Secondary | ICD-10-CM

## 2021-10-16 DIAGNOSIS — E119 Type 2 diabetes mellitus without complications: Secondary | ICD-10-CM | POA: Diagnosis not present

## 2021-10-16 DIAGNOSIS — E785 Hyperlipidemia, unspecified: Secondary | ICD-10-CM | POA: Diagnosis not present

## 2021-10-16 DIAGNOSIS — E538 Deficiency of other specified B group vitamins: Secondary | ICD-10-CM

## 2021-10-16 DIAGNOSIS — I10 Essential (primary) hypertension: Secondary | ICD-10-CM

## 2021-10-16 LAB — LIPID PANEL
Cholesterol: 153 mg/dL (ref 0–200)
HDL: 67.7 mg/dL (ref >39.00)
LDL Cholesterol: 73 mg/dL (ref 0–99)
NonHDL: 85.73
Total CHOL/HDL Ratio: 2
Triglycerides: 64 mg/dL (ref 0.0–149.0)
VLDL: 12.8 mg/dL (ref 0.0–40.0)

## 2021-10-16 LAB — HEMOGLOBIN A1C: Hgb A1c MFr Bld: 8.3 % — ABNORMAL HIGH (ref 4.6–6.5)

## 2021-10-16 LAB — HEPATIC FUNCTION PANEL
ALT: 18 U/L (ref 0–35)
AST: 14 U/L (ref 0–37)
Albumin: 4 g/dL (ref 3.5–5.2)
Alkaline Phosphatase: 117 U/L (ref 39–117)
Bilirubin, Direct: 0.1 mg/dL (ref 0.0–0.3)
Total Bilirubin: 0.4 mg/dL (ref 0.2–1.2)
Total Protein: 7 g/dL (ref 6.0–8.3)

## 2021-10-16 LAB — BASIC METABOLIC PANEL
BUN: 18 mg/dL (ref 6–23)
CO2: 27 mEq/L (ref 19–32)
Calcium: 9 mg/dL (ref 8.4–10.5)
Chloride: 103 mEq/L (ref 96–112)
Creatinine, Ser: 0.76 mg/dL (ref 0.40–1.20)
GFR: 83.43 mL/min (ref >60.00)
Glucose, Bld: 165 mg/dL — ABNORMAL HIGH (ref 70–99)
Potassium: 4.2 mEq/L (ref 3.5–5.1)
Sodium: 138 mEq/L (ref 135–145)

## 2021-10-16 LAB — VITAMIN B12: Vitamin B-12: >1550 pg/mL — ABNORMAL HIGH (ref 211–911)

## 2021-10-16 LAB — VITAMIN D 25 HYDROXY (VIT D DEFICIENCY, FRACTURES): VITD: 32.48 ng/mL (ref 30.00–100.00)

## 2021-10-16 NOTE — Progress Notes (Signed)
Patient ID: Theresa Gibson, female   DOB: 1956-11-07, 65 y.o.   MRN: [id number]       Chief Complaint: follow up HTN, HLD and hyperglycemia        HPI:  Theresa Gibson is a 65 y.o. female here overall doing ok; Pt denies chest pain, increased sob or doe, wheezing, orthopnea, PND, increased LE swelling, palpitations, dizziness or syncope.   Pt denies polydipsia, polyuria, or new focal neuro s/s.   Pt denies polydipsia, polyuria, or new focal neuro s/s.    Pt has seen podaitry with achilles tendonitis with cortisone shot, some imrpoved.  Has mammogram scheduled next mo.  Did see Wt Management but cannot afford to go back due to cost, and trulicity for her was $761 per mo.  Jardiance also went up as well to $150, so currently taking half of the 25 mg jardiance only for sugar.  Has endo f/u on jan 24 planned but was hoping ofr a1c today, and lipids.   Neds optho referral    Pt denies fever, wt loss, night sweats, loss of appetite, or other constitutional symptoms   Wt Readings from Last 3 Encounters:  10/16/21 239 lb (108.4 kg)  08/05/21 228 lb (103.4 kg)  07/07/21 232 lb (105.2 kg)   BP Readings from Last 3 Encounters:  10/16/21 110/70  08/05/21 (!) 142/77  07/07/21 124/78         Past Medical History:  Diagnosis Date   Acute MI, inferior wall ([place]) 08/2016   s/p DES x 2 to the RCA   Allergy    Arthritis    Depression    GERD (gastroesophageal reflux disease)    Hypertension    Meningioma (HCC)    PONV (postoperative nausea and vomiting)    nausea only   Wears dentures    upper   Past Surgical History:  Procedure Laterality Date   ABDOMINAL HYSTERECTOMY     Total    BREAST EXCISIONAL BIOPSY Left    approx 30+ years ago   BREAST SURGERY     BX right breast   COLONOSCOPY W/ BIOPSIES AND POLYPECTOMY     CORONARY ANGIOPLASTY WITH STENT PLACEMENT     KNEE SURGERY Bilateral    arthroscopy   LAPAROSCOPIC APPENDECTOMY N/A 12/10/2015   Procedure: APPENDECTOMY LAPAROSCOPIC;  Surgeon:  Erroll Luna, MD;  Location: [facility];  Service: General;  Laterality: N/A;   MULTIPLE TOOTH EXTRACTIONS     NASAL/SINUS ENDOSCOPY Bilateral 03/29/2017   ENDOSCOPIC SINUS SURGERY WITH NAVIGATION (Bilateral)   SINUS ENDO W/FUSION Bilateral 03/29/2017   Procedure: ENDOSCOPIC SINUS SURGERY WITH NAVIGATION;  Surgeon: Melissa Montane, MD;  Location: [facility];  Service: ENT;  Laterality: Bilateral;   TUBAL LIGATION     TURBINATE REDUCTION Bilateral 03/29/2017   Procedure: TURBINATE REDUCTION;  Surgeon: Melissa Montane, MD;  Location: [facility];  Service: ENT;  Laterality: Bilateral;    reports that she quit smoking about 3 years ago. Her smoking use included cigarettes. She has a 21.00 pack-year smoking history. She has never used smokeless tobacco. She reports current alcohol use. She reports that she does not use drugs. family history includes Arthritis in her mother; Heart attack in her father; Hypertension in her mother; Ovarian cancer in her maternal grandmother. Allergies  Allergen Reactions   Nicotine     Patch...caused a rash   Ace Inhibitors Cough    REACTION: Dry cough-lisinopril    Current Outpatient Medications on File Prior to Visit  Medication Sig  Dispense Refill   acetaminophen (TYLENOL) 500 MG tablet Take 2 tablets (1,000 mg total) by mouth every 8 (eight) hours as needed for moderate pain. 30 tablet 0   albuterol (VENTOLIN HFA) 108 (90 Base) MCG/ACT inhaler INHALE 2 PUFFS BY MOUTH EVERY 4 TO 6 HOURS AS NEEDED FOR SHORTNESS OF BREATH WHEEZING OR COUGHING 18 g 5   aspirin EC 81 MG tablet Take 81 mg by mouth daily.     atorvastatin (LIPITOR) 40 MG tablet Take 1 tablet (40 mg total) by mouth daily at 6 PM. 90 tablet 3   Blood Glucose Monitoring Suppl (ACCU-CHEK GUIDE ME) w/Device KIT Use as directed four times daily E11.9 1 kit 0   buPROPion (WELLBUTRIN SR) 150 MG 12 hr tablet Take 1 tablet (150 mg total) by mouth 2 (two) times daily. 180 tablet 3   carvedilol (COREG) 3.125 MG tablet Take 3.125 mg by  mouth 2 (two) times daily with a meal.     cetirizine (ZYRTEC) 10 MG tablet Take 10 mg by mouth daily as needed for allergies.     cholecalciferol (VITAMIN D) 1000 units tablet Take 1,000 Units by mouth daily.     cyanocobalamin 1000 MCG tablet Take by mouth.     diclofenac sodium (VOLTAREN) 1 % GEL Apply 4 g topically 4 (four) times daily. 100 g 1   famotidine (PEPCID) 20 MG tablet Take 1 tablet (20 mg total) by mouth 2 (two) times daily. 180 tablet 3   fluticasone (FLONASE) 50 MCG/ACT nasal spray Place 2 sprays into both nostrils daily. 16 g 0   glucose blood test strip Use to test blood sugar levels once a day. DX: E11.9..  Test strips for Accu-Chek Guide 100 each 12   JARDIANCE 10 MG TABS tablet Take 1 tablet (10 mg total) by mouth daily. 30 tablet 3   ketoconazole (NIZORAL) 2 % cream APPLY  CREAM TOPICALLY TO VAGINAL AREA TWICE DAILY FOR 10 DAYS 45 g 0   Lancets MISC Use as directed once daily E11.9 Accu-Chek Guide 100 each 3   losartan (COZAAR) 50 MG tablet Take 1 tablet (50 mg total) by mouth daily. 90 tablet 3   losartan (COZAAR) 50 MG tablet Take 1 tablet by mouth daily.     meloxicam (MOBIC) 7.5 MG tablet Take 1 tablet (7.5 mg total) by mouth 2 (two) times daily as needed. for pain 180 tablet 3   naproxen (NAPROSYN) 500 MG tablet TAKE 1 TABLET BY MOUTH TWICE DAILY WITH MEALS as needed 60 tablet 3   nitroGLYCERIN (NITROSTAT) 0.4 MG SL tablet Place 0.4 mg under the tongue every 5 (five) minutes as needed for chest pain.     ondansetron (ZOFRAN) 4 MG tablet Take 1 tablet (4 mg total) by mouth every 8 (eight) hours as needed for nausea or vomiting. 20 tablet 1   OZEMPIC, 1 MG/DOSE, 4 MG/3ML SOPN      pantoprazole (PROTONIX) 40 MG tablet Take 1 tablet (40 mg total) by mouth daily. 90 tablet 1   sodium chloride (OCEAN) 0.65 % SOLN nasal spray Place 1 spray into both nostrils as needed for congestion. 88 mL 0   traMADol (ULTRAM) 50 MG tablet Take 1 tablet (50 mg total) by mouth every 6  (six) hours as needed. 15 tablet 0   triamcinolone (NASACORT) 55 MCG/ACT AERO nasal inhaler Place 2 sprays into the nose daily. (Patient taking differently: Place 1-2 sprays into the nose daily as needed (NASAL CONGESTION).)     TRULICITY  0.75 MG/0.5ML SOPN Inject once a week. 2 mL 2   Vitamin D, Ergocalciferol, (DRISDOL) 1.25 MG (50000 UT) CAPS capsule Take 1 capsule (50,000 Units total) by mouth every 7 (seven) days. 12 capsule 0   fluconazole (DIFLUCAN) 150 MG tablet 1 tab by mouth every 3 days as needed (Patient not taking: Reported on 10/16/2021) 2 tablet 1   No current facility-administered medications on file prior to visit.        ROS:  All others reviewed and negative.  Objective        PE:  BP 110/70 (BP Location: Left Arm, Patient Position: Sitting, Cuff Size: Large)   Pulse 69   Temp 97.8 F (36.6 C) (Oral)   Ht '5\' 3"'  (1.6 m)   Wt 239 lb (108.4 kg)   SpO2 98%   BMI 42.34 kg/m                 Constitutional: Pt appears in NAD               HENT: Head: NCAT.                Right Ear: External ear normal.                 Left Ear: External ear normal.                Eyes: . Pupils are equal, round, and reactive to light. Conjunctivae and EOM are normal               Nose: without d/c or deformity               Neck: Neck supple. Gross normal ROM               Cardiovascular: Normal rate and regular rhythm.                 Pulmonary/Chest: Effort normal and breath sounds without rales or wheezing.                Abd:  Soft, NT, ND, + BS, no organomegaly               Neurological: Pt is alert. At baseline orientation, motor grossly intact               Skin: Skin is warm. No rashes, no other new lesions, LE edema - none               Psychiatric: Pt behavior is normal without agitation   Micro: none  Cardiac tracings I have personally interpreted today:  none  Pertinent Radiological findings (summarize): none   Lab Results  Component Value Date   WBC 8.0 01/26/2021    HGB 13.7 01/26/2021   HCT 40.8 01/26/2021   PLT 292.0 01/26/2021   GLUCOSE 165 (H) 10/16/2021   CHOL 153 10/16/2021   TRIG 64.0 10/16/2021   HDL 67.70 10/16/2021   LDLCALC 73 10/16/2021   ALT 18 10/16/2021   AST 14 10/16/2021   NA 138 10/16/2021   K 4.2 10/16/2021   CL 103 10/16/2021   CREATININE 0.76 10/16/2021   BUN 18 10/16/2021   CO2 27 10/16/2021   TSH 1.36 01/26/2021   INR 1.08 03/29/2017   HGBA1C 8.3 Repeated and verified X2. (H) 10/16/2021   MICROALBUR 1.0 01/26/2021   Assessment/Plan:  GENEVRA ORNE is a 65 y.o. Black or African American [2] female with  has a past medical history  of Acute MI, inferior wall ([place]) (08/2016), Allergy, Arthritis, Depression, GERD (gastroesophageal reflux disease), Hypertension, Meningioma ([place]), PONV (postoperative nausea and vomiting), and Wears dentures.  Vitamin D deficiency Last vitamin D Lab Results  Component Value Date   VD25OH 26.67 (L) 01/26/2021   Low, to start oral replacement   Hyperlipidemia with target LDL less than 70 Lab Results  Component Value Date   LDLCALC 73 10/16/2021   Uncontrolled, goal LDL < 70, pt to continue current statin lipitor 40   Essential hypertension BP Readings from Last 3 Encounters:  10/16/21 110/70  08/05/21 (!) 142/77  07/07/21 124/78   Stable, pt to continue medical treatment  - coreg, losartan   Diabetes mellitus without complication ([place]) Lab Results  Component Value Date   HGBA1C 8.3 Repeated and verified X2. (H) 10/16/2021   Uncontrolled, goal a1c < 7, pt to increase the jardiance to 25 mg per day, DM diet, wt control and f/u endo as planned jan 2023  Followup: Return in about 6 months (around 04/15/2022).  Cathlean Cower, MD 10/23/2021 4:42 AM [facility]

## 2021-10-16 NOTE — Patient Instructions (Addendum)
Please consider looking into which of these medicaitons for sugar and wt loss are covered by your insurance after Jan 1 - trulicity, victoza, ozempic, saxenda, wegovy or rybelsus  Please continue all other medications as before, and refills have been done if requested.  Please have the pharmacy call with any other refills you may need.  Please continue your efforts at being more active, low cholesterol diet, and weight control.  Please keep your appointments with your specialists as you may have planned - endocrinology in Jan 2023  You will be contacted regarding the referral for: ophthalmology (eye doctor)  Please go to the LAB at the blood drawing area for the tests to be done  You will be contacted by phone if any changes need to be made immediately.  Otherwise, you will receive a letter about your results with an explanation, but please check with MyChart first.  Please remember to sign up for MyChart if you have not done so, as this will be important to you in the future with finding out test results, communicating by private email, and scheduling acute appointments online when needed.  Please make an Appointment to return in 6 months, or sooner if needed

## 2021-10-16 NOTE — Assessment & Plan Note (Signed)
Last vitamin D Lab Results  Component Value Date   VD25OH 26.67 (L) 01/26/2021   Low, to start oral replacement

## 2021-10-17 ENCOUNTER — Encounter: Payer: Self-pay | Admitting: Internal Medicine

## 2021-10-17 ENCOUNTER — Other Ambulatory Visit: Payer: Self-pay | Admitting: Internal Medicine

## 2021-10-17 MED ORDER — METFORMIN HCL ER 500 MG PO TB24
500.0000 mg | ORAL_TABLET | Freq: Every day | ORAL | 3 refills | Status: DC
Start: 2021-10-17 — End: 2023-02-08

## 2021-10-23 ENCOUNTER — Encounter: Payer: Self-pay | Admitting: Internal Medicine

## 2021-10-23 NOTE — Assessment & Plan Note (Signed)
Lab Results  Component Value Date   LDLCALC 73 10/16/2021   Uncontrolled, goal LDL < 70, pt to continue current statin lipitor 40

## 2021-10-23 NOTE — Assessment & Plan Note (Signed)
BP Readings from Last 3 Encounters:  10/16/21 110/70  08/05/21 (!) 142/77  07/07/21 124/78   Stable, pt to continue medical treatment  - coreg, losartan

## 2021-10-23 NOTE — Assessment & Plan Note (Signed)
Lab Results  Component Value Date   HGBA1C 8.3 Repeated and verified X2. (H) 10/16/2021   Uncontrolled, goal a1c < 7, pt to increase the jardiance to 25 mg per day, DM diet, wt control and f/u endo as planned jan 2023

## 2021-10-24 ENCOUNTER — Encounter: Payer: Self-pay | Admitting: Internal Medicine

## 2021-10-25 ENCOUNTER — Other Ambulatory Visit: Payer: Self-pay | Admitting: Internal Medicine

## 2021-10-25 DIAGNOSIS — B3731 Acute candidiasis of vulva and vagina: Secondary | ICD-10-CM

## 2021-10-25 MED ORDER — FLUCONAZOLE 150 MG PO TABS: ORAL_TABLET | ORAL | 1 refills | Status: DC

## 2021-10-27 ENCOUNTER — Encounter: Payer: Self-pay | Admitting: Internal Medicine

## 2021-10-27 ENCOUNTER — Other Ambulatory Visit: Payer: Self-pay | Admitting: Internal Medicine

## 2021-10-30 ENCOUNTER — Telehealth: Payer: Self-pay | Admitting: Internal Medicine

## 2021-10-30 NOTE — Telephone Encounter (Signed)
Spoke with Crystal with [employer] and faxed copy of patient's labs

## 2021-10-30 NOTE — Telephone Encounter (Signed)
Called back and states only header was received, but no information on pages. Please refax   Fax #- [phone]

## 2021-10-30 NOTE — Telephone Encounter (Signed)
Crystal calling in from the Medication Assistance Program  Patient has applied to their program for financial assistance w/ covering her medications but they need to confirm what all patient is currently prescribed  Please call when available  [phone]

## 2021-10-31 ENCOUNTER — Telehealth: Payer: Self-pay | Admitting: Internal Medicine

## 2021-10-31 NOTE — Telephone Encounter (Signed)
Crystal called from [facility] Dept stating they will be assisting the patient with her medication she asked if the following prescriptions could be faxed over to them.  Jardiance 10 mg Ozempic 1 mg  The fax number is [phone]

## 2021-10-31 NOTE — Telephone Encounter (Signed)
Crystal also wants clarification that Trulicity has been discontinued.

## 2021-10-31 NOTE — Telephone Encounter (Signed)
Records re-faxed.

## 2021-11-01 NOTE — Telephone Encounter (Signed)
Theresa Gibson called back to follow up on the clarification of the Trulicity and fax of Jardiance and [place]. Clarified she needs the written scripts for the jardiance and ozempic. Requesting a callback.   Best contact #: [phone]

## 2021-11-01 NOTE — Telephone Encounter (Signed)
Spoke with Crystal from Medication [employer] program. She would like to confirm what patient is supposed to be taking regarding diabetes. Per Crystal, both Jardiance and Ozempic will be free this one time through them and it should last her until January 1st when insurance kicks in. Also inquiring if Trulicity has been discontinued. Needs prescriptions printed so that I can fax to [place].

## 2021-11-02 MED ORDER — JARDIANCE 10 MG PO TABS: 10.0000 mg | ORAL_TABLET | Freq: Every day | ORAL | 3 refills | Status: DC

## 2021-11-02 MED ORDER — OZEMPIC (1 MG/DOSE) 4 MG/3ML ~~LOC~~ SOPN: 1.0000 mg | PEN_INJECTOR | SUBCUTANEOUS | 3 refills | Status: DC

## 2021-11-02 NOTE — Telephone Encounter (Signed)
Orders faxed and [place] notified via voicemail

## 2021-11-02 NOTE — Telephone Encounter (Signed)
Ok jardiance and ozempic done hardcopy to [employer] d/c from med list

## 2021-11-02 NOTE — Addendum Note (Signed)
Addended by: Biagio Borg on: 11/02/2021 08:13 AM   Modules accepted: Orders

## 2021-11-03 ENCOUNTER — Telehealth: Payer: Self-pay | Admitting: Internal Medicine

## 2021-11-03 NOTE — Telephone Encounter (Signed)
Crystal called and states she never received fax. Please refax to [phone] ATTN: Crystal.

## 2021-11-03 NOTE — Telephone Encounter (Signed)
Patient notified

## 2021-11-03 NOTE — Telephone Encounter (Signed)
Patient calling in  At last OV provider prescribed patient [place]  Patient wants to know if she should continue taking the Metformin or stop since shes now taking the other 2 medications  Please call patient [phone]

## 2021-11-03 NOTE — Telephone Encounter (Signed)
Orders refaxed.

## 2021-11-03 NOTE — Telephone Encounter (Signed)
Yes, ok to continue the metformin too

## 2021-11-09 ENCOUNTER — Other Ambulatory Visit: Payer: Self-pay

## 2021-11-09 ENCOUNTER — Ambulatory Visit
Admission: RE | Admit: 2021-11-09 | Discharge: 2021-11-09 | Disposition: A | Discharge status: Home / Self Care | Payer: Medicare PPO | Source: Ambulatory Visit | Attending: Internal Medicine | Admitting: Internal Medicine

## 2021-11-09 DIAGNOSIS — Z1231 Encounter for screening mammogram for malignant neoplasm of breast: Secondary | ICD-10-CM

## 2021-12-26 ENCOUNTER — Telehealth: Payer: Self-pay | Admitting: Internal Medicine

## 2021-12-26 DIAGNOSIS — E1142 Type 2 diabetes mellitus with diabetic polyneuropathy: Secondary | ICD-10-CM | POA: Diagnosis not present

## 2021-12-26 DIAGNOSIS — E1165 Type 2 diabetes mellitus with hyperglycemia: Secondary | ICD-10-CM | POA: Diagnosis not present

## 2021-12-26 DIAGNOSIS — Z7984 Long term (current) use of oral hypoglycemic drugs: Secondary | ICD-10-CM | POA: Diagnosis not present

## 2021-12-26 MED ORDER — ONETOUCH VERIO VI STRP: ORAL_STRIP | 3 refills | Status: DC

## 2021-12-26 NOTE — Telephone Encounter (Signed)
1.Medication Requested: ONETOUCH VERIO test strip  2. Pharmacy (Name, Street, [place]): [place], [place].  Phone:  [phone] Fax:  [phone]   3. On Med List: yes  4. Last Visit with PCP: 11.14.22  5. Next visit date with PCP: n/a   Agent: Please be advised that RX refills may take up to 3 business days. We ask that you follow-up with your pharmacy.

## 2021-12-27 NOTE — Telephone Encounter (Signed)
Patient requesting ONETOUCH VERIO lancets

## 2021-12-27 NOTE — Telephone Encounter (Signed)
Please refill as per office routine med refill policy (all routine meds to be refilled for 3 mo or monthly (per pt preference) up to one year from last visit, then month to month grace period for 3 mo, then further med refills will have to be denied) ? ?

## 2021-12-27 NOTE — Telephone Encounter (Signed)
1.Medication Requested: One Touch Verio Lancets  2. Pharmacy (Name, Street, [place]): [place], [place].        Phone:      [phone] Fax:           [phone]   3. On Med List: no  4. Last Visit with PCP: 11.14.22  5. Next visit date with PCP: n/a  **Patient says she is completely out of lancets**   Agent: Please be advised that RX refills may take up to 3 business days. We ask that you follow-up with your pharmacy.

## 2021-12-29 ENCOUNTER — Encounter: Payer: Self-pay | Admitting: Internal Medicine

## 2022-01-01 ENCOUNTER — Other Ambulatory Visit: Payer: Self-pay

## 2022-01-01 ENCOUNTER — Other Ambulatory Visit: Payer: Self-pay | Admitting: Internal Medicine

## 2022-01-01 DIAGNOSIS — E119 Type 2 diabetes mellitus without complications: Secondary | ICD-10-CM

## 2022-01-01 MED ORDER — LANCETS MISC: 3 refills | Status: DC

## 2022-01-17 ENCOUNTER — Encounter: Payer: Self-pay | Admitting: Internal Medicine

## 2022-01-17 DIAGNOSIS — B3731 Acute candidiasis of vulva and vagina: Secondary | ICD-10-CM

## 2022-01-17 MED ORDER — KETOCONAZOLE 2 % EX CREA: TOPICAL_CREAM | CUTANEOUS | 1 refills | Status: DC

## 2022-01-17 MED ORDER — FLUCONAZOLE 150 MG PO TABS: ORAL_TABLET | ORAL | 1 refills | Status: DC

## 2022-02-02 ENCOUNTER — Other Ambulatory Visit: Payer: Self-pay | Admitting: Internal Medicine

## 2022-03-27 DIAGNOSIS — E1140 Type 2 diabetes mellitus with diabetic neuropathy, unspecified: Secondary | ICD-10-CM | POA: Diagnosis not present

## 2022-03-27 DIAGNOSIS — Z7985 Long-term (current) use of injectable non-insulin antidiabetic drugs: Secondary | ICD-10-CM | POA: Diagnosis not present

## 2022-03-27 DIAGNOSIS — E1159 Type 2 diabetes mellitus with other circulatory complications: Secondary | ICD-10-CM | POA: Diagnosis not present

## 2022-03-27 DIAGNOSIS — Z7984 Long term (current) use of oral hypoglycemic drugs: Secondary | ICD-10-CM | POA: Diagnosis not present

## 2022-03-27 DIAGNOSIS — E1165 Type 2 diabetes mellitus with hyperglycemia: Secondary | ICD-10-CM | POA: Diagnosis not present

## 2022-03-28 ENCOUNTER — Ambulatory Visit (INDEPENDENT_AMBULATORY_CARE_PROVIDER_SITE_OTHER): Payer: HMO | Admitting: Internal Medicine

## 2022-03-28 ENCOUNTER — Encounter: Payer: Self-pay | Admitting: Internal Medicine

## 2022-03-28 VITALS — BP 130/74 | HR 73 | Temp 97.9°F | Ht 63.0 in | Wt 232.0 lb

## 2022-03-28 DIAGNOSIS — E559 Vitamin D deficiency, unspecified: Secondary | ICD-10-CM

## 2022-03-28 DIAGNOSIS — E538 Deficiency of other specified B group vitamins: Secondary | ICD-10-CM

## 2022-03-28 DIAGNOSIS — E119 Type 2 diabetes mellitus without complications: Secondary | ICD-10-CM

## 2022-03-28 DIAGNOSIS — M170 Bilateral primary osteoarthritis of knee: Secondary | ICD-10-CM | POA: Diagnosis not present

## 2022-03-28 DIAGNOSIS — Z0000 Encounter for general adult medical examination without abnormal findings: Secondary | ICD-10-CM

## 2022-03-28 DIAGNOSIS — Z111 Encounter for screening for respiratory tuberculosis: Secondary | ICD-10-CM | POA: Diagnosis not present

## 2022-03-28 LAB — LIPID PANEL
Cholesterol: 158 mg/dL (ref 0–200)
HDL: 63.5 mg/dL (ref >39.00)
LDL Cholesterol: 81 mg/dL (ref 0–99)
NonHDL: 94.96
Total CHOL/HDL Ratio: 2
Triglycerides: 71 mg/dL (ref 0.0–149.0)
VLDL: 14.2 mg/dL (ref 0.0–40.0)

## 2022-03-28 LAB — URINALYSIS, ROUTINE W REFLEX MICROSCOPIC
Bilirubin Urine: NEGATIVE
Hgb urine dipstick: NEGATIVE
Ketones, ur: NEGATIVE
Leukocytes,Ua: NEGATIVE
Nitrite: NEGATIVE
RBC / HPF: NONE SEEN (ref 0–?)
Specific Gravity, Urine: 1.02 (ref 1.000–1.030)
Total Protein, Urine: NEGATIVE
Urine Glucose: >=1000 — AB
Urobilinogen, UA: 0.2 (ref 0.0–1.0)
pH: 6 (ref 5.0–8.0)

## 2022-03-28 LAB — BASIC METABOLIC PANEL
BUN: 17 mg/dL (ref 6–23)
CO2: 28 mEq/L (ref 19–32)
Calcium: 9.3 mg/dL (ref 8.4–10.5)
Chloride: 106 mEq/L (ref 96–112)
Creatinine, Ser: 0.81 mg/dL (ref 0.40–1.20)
GFR: 77.05 mL/min (ref >60.00)
Glucose, Bld: 85 mg/dL (ref 70–99)
Potassium: 4.2 mEq/L (ref 3.5–5.1)
Sodium: 140 mEq/L (ref 135–145)

## 2022-03-28 LAB — HEPATIC FUNCTION PANEL
ALT: 14 U/L (ref 0–35)
AST: 15 U/L (ref 0–37)
Albumin: 4.2 g/dL (ref 3.5–5.2)
Alkaline Phosphatase: 107 U/L (ref 39–117)
Bilirubin, Direct: 0.1 mg/dL (ref 0.0–0.3)
Total Bilirubin: 0.4 mg/dL (ref 0.2–1.2)
Total Protein: 7.3 g/dL (ref 6.0–8.3)

## 2022-03-28 LAB — CBC WITH DIFFERENTIAL/PLATELET
Basophils Absolute: 0.1 10*3/uL (ref 0.0–0.1)
Basophils Relative: 0.9 % (ref 0.0–3.0)
Eosinophils Absolute: 0.2 10*3/uL (ref 0.0–0.7)
Eosinophils Relative: 3.3 % (ref 0.0–5.0)
HCT: 43.4 % (ref 36.0–46.0)
Hemoglobin: 14.1 g/dL (ref 12.0–15.0)
Lymphocytes Relative: 34 % (ref 12.0–46.0)
Lymphs Abs: 2.5 10*3/uL (ref 0.7–4.0)
MCHC: 32.6 g/dL (ref 30.0–36.0)
MCV: 83.5 fl (ref 78.0–100.0)
Monocytes Absolute: 0.5 10*3/uL (ref 0.1–1.0)
Monocytes Relative: 7.5 % (ref 3.0–12.0)
Neutro Abs: 4 10*3/uL (ref 1.4–7.7)
Neutrophils Relative %: 54.3 % (ref 43.0–77.0)
Platelets: 246 10*3/uL (ref 150.0–400.0)
RBC: 5.2 Mil/uL — ABNORMAL HIGH (ref 3.87–5.11)
RDW: 16.1 % — ABNORMAL HIGH (ref 11.5–15.5)
WBC: 7.3 10*3/uL (ref 4.0–10.5)

## 2022-03-28 LAB — VITAMIN D 25 HYDROXY (VIT D DEFICIENCY, FRACTURES): VITD: 36.12 ng/mL (ref 30.00–100.00)

## 2022-03-28 LAB — MICROALBUMIN / CREATININE URINE RATIO
Creatinine,U: 98.2 mg/dL
Microalb Creat Ratio: 1.9 mg/g (ref 0.0–30.0)
Microalb, Ur: 1.8 mg/dL (ref 0.0–1.9)

## 2022-03-28 LAB — TSH: TSH: 0.8 u[IU]/mL (ref 0.35–5.50)

## 2022-03-28 LAB — VITAMIN B12: Vitamin B-12: 831 pg/mL (ref 211–911)

## 2022-03-28 MED ORDER — DICLOFENAC SODIUM 2 % EX SOLN: CUTANEOUS | 2 refills | Status: DC

## 2022-03-28 NOTE — Patient Instructions (Addendum)
Your PPD was placed today ? ?Please return for nurse visit on Friday apr 28 to be read ? ?Please check with your new employer to see if they cover [employer] ? ?Please take all new medication as prescribed - the [place] for the knees ? ?Please continue all other medications as before, and refills have been done if requested. ? ?Please have the pharmacy call with any other refills you may need. ? ?Please continue your efforts at being more active, low cholesterol diet, and weight control. ? ?You are otherwise up to date with prevention measures today. ? ?Please keep your appointments with your specialists as you may have planned - endocrinology ? ?You will be contacted regarding the referral for: EYE exam ? ?Please go to the LAB at the blood drawing area for the tests to be done ? ?You will be contacted by phone if any changes need to be made immediately.  Otherwise, you will receive a letter about your results with an explanation, but please check with MyChart first. ? ?Please remember to sign up for MyChart if you have not done so, as this will be important to you in the future with finding out test results, communicating by private email, and scheduling acute appointments online when needed. ? ?Please make an Appointment to return for your 1 year visit, or sooner if needed ?

## 2022-03-28 NOTE — Progress Notes (Signed)
Patient ID: Theresa Gibson, female   DOB: 26-Nov-1956, 66 y.o.   MRN: [id number] ? ? ? ?     Chief Complaint:: wellness exam and Knee Pain (Would like medication for it/) and Tb request ?  ? ?     HPI:  Theresa Gibson is a 66 y.o. female here for wellness exam; due for optho exam and labs, decliens covid booster, shingrix, tdap, o/w up to date ?         ?              Also did see endo yesterday with a1c 5..0., BS 132  Needs PPD for employment.  Has heard of wegovy and wanting to consider.  Also had 1 mo worsening biltaral knee pain, but does not want an oral med with risk of ulcers or narcotic.  Pt denies chest pain, increased sob or doe, wheezing, orthopnea, PND, increased LE swelling, palpitations, dizziness or syncope.   Pt denies polydipsia, polyuria, or new focal neuro s/s.    Pt denies fever, wt loss, night sweats, loss of appetite, or other constitutional symptoms   ?  ?Wt Readings from Last 3 Encounters:  ?03/28/22 232 lb (105.2 kg)  ?10/16/21 239 lb (108.4 kg)  ?08/05/21 228 lb (103.4 kg)  ? ?BP Readings from Last 3 Encounters:  ?03/28/22 130/74  ?10/16/21 110/70  ?08/05/21 (!) 142/77  ? ?Immunization History  ?Administered Date(s) Administered  ? Hepatitis B 02/24/2007, 03/27/2007, 05/06/2007, 12/03/2007, 05/09/2008  ? Influenza Whole 12/03/2007, 08/20/2008  ? Influenza,inj,Quad PF,6+ Mos 09/07/2014, 11/19/2016, 09/01/2018, 09/07/2019  ? Influenza,inj,quad, With Preservative 09/11/2017  ? Influenza-Unspecified 09/18/2021  ? PFIZER(Purple Top)SARS-COV-2 Vaccination 12/20/2019, 01/20/2020, 09/16/2020, 09/18/2021  ? PPD Test 03/28/2022  ? Pneumococcal Conjugate-13 06/25/2019  ? Pneumococcal Polysaccharide-23 01/26/2021  ? Td 04/14/2008  ? ?Health Maintenance Due  ?Topic Date Due  ? OPHTHALMOLOGY EXAM  07/03/2019  ? ?  ? ?Past Medical History:  ?Diagnosis Date  ? Acute MI, inferior wall ([place]) 08/2016  ? s/p DES x 2 to the RCA  ? Allergy   ? Arthritis   ? Depression   ? GERD (gastroesophageal reflux disease)   ?  Hypertension   ? Meningioma ([place])   ? PONV (postoperative nausea and vomiting)   ? nausea only  ? Wears dentures   ? upper  ? ?Past Surgical History:  ?Procedure Laterality Date  ? ABDOMINAL HYSTERECTOMY    ? Total   ? BREAST EXCISIONAL BIOPSY Left   ? approx 30+ years ago  ? BREAST SURGERY    ? BX right breast  ? COLONOSCOPY W/ BIOPSIES AND POLYPECTOMY    ? CORONARY ANGIOPLASTY WITH STENT PLACEMENT    ? KNEE SURGERY Bilateral   ? arthroscopy  ? LAPAROSCOPIC APPENDECTOMY N/A 12/10/2015  ? Procedure: APPENDECTOMY LAPAROSCOPIC;  Surgeon: Erroll Luna, MD;  Location: [facility];  Service: General;  Laterality: N/A;  ? MULTIPLE TOOTH EXTRACTIONS    ? NASAL/SINUS ENDOSCOPY Bilateral 03/29/2017  ? ENDOSCOPIC SINUS SURGERY WITH NAVIGATION (Bilateral)  ? SINUS ENDO W/FUSION Bilateral 03/29/2017  ? Procedure: ENDOSCOPIC SINUS SURGERY WITH NAVIGATION;  Surgeon: Melissa Montane, MD;  Location: [facility];  Service: ENT;  Laterality: Bilateral;  ? TUBAL LIGATION    ? TURBINATE REDUCTION Bilateral 03/29/2017  ? Procedure: TURBINATE REDUCTION;  Surgeon: Melissa Montane, MD;  Location: [facility];  Service: ENT;  Laterality: Bilateral;  ? ? reports that she quit smoking about 4 years ago. Her smoking use included cigarettes. She has a 21.00  pack-year smoking history. She has never used smokeless tobacco. She reports current alcohol use. She reports that she does not use drugs. ?family history includes Arthritis in her mother; Heart attack in her father; Hypertension in her mother; Ovarian cancer in her maternal grandmother. ?Allergies  ?Allergen Reactions  ? Nicotine   ?  Patch...caused a rash  ? Ace Inhibitors Cough  ?  REACTION: Dry cough-lisinopril ?  ? ?Current Outpatient Medications on File Prior to Visit  ?Medication Sig Dispense Refill  ? acetaminophen (TYLENOL) 500 MG tablet Take 2 tablets (1,000 mg total) by mouth every 8 (eight) hours as needed for moderate pain. 30 tablet 0  ? albuterol (VENTOLIN HFA) 108 (90 Base) MCG/ACT inhaler INHALE 2 PUFFS  BY MOUTH EVERY 4 TO 6 HOURS AS NEEDED FOR SHORTNESS OF BREATH WHEEZING OR COUGHING 18 g 5  ? aspirin EC 81 MG tablet Take 81 mg by mouth daily.    ? atorvastatin (LIPITOR) 40 MG tablet Take 1 tablet (40 mg total) by mouth daily at 6 PM. 90 tablet 3  ? Blood Glucose Monitoring Suppl (ACCU-CHEK GUIDE ME) w/Device KIT Use as directed four times daily E11.9 1 kit 0  ? buPROPion (WELLBUTRIN SR) 150 MG 12 hr tablet Take 1 tablet by mouth twice daily 60 tablet 9  ? carvedilol (COREG) 3.125 MG tablet Take 3.125 mg by mouth 2 (two) times daily with a meal.    ? cetirizine (ZYRTEC) 10 MG tablet Take 10 mg by mouth daily as needed for allergies.    ? cholecalciferol (VITAMIN D) 1000 units tablet Take 1,000 Units by mouth daily.    ? cyanocobalamin 1000 MCG tablet Take by mouth.    ? diclofenac sodium (VOLTAREN) 1 % GEL Apply 4 g topically 4 (four) times daily. 100 g 1  ? famotidine (PEPCID) 20 MG tablet Take 1 tablet (20 mg total) by mouth 2 (two) times daily. 180 tablet 3  ? fluconazole (DIFLUCAN) 150 MG tablet 1 tab by mouth every 3 days as needed 2 tablet 1  ? fluticasone (FLONASE) 50 MCG/ACT nasal spray Place 2 sprays into both nostrils daily. 16 g 0  ? glucose blood (ONETOUCH VERIO) test strip USE 1 STRIP TO CHECK GLUCOSE TWICE DAILY AS DIRECTED 200 each 3  ? JARDIANCE 10 MG TABS tablet Take 1 tablet (10 mg total) by mouth daily. 90 tablet 3  ? ketoconazole (NIZORAL) 2 % cream APPLY  CREAM TOPICALLY TO VAGINAL AREA TWICE DAILY FOR 10 DAYS 45 g 1  ? Lancets MISC Use as directed once daily E11.9 Accu-Chek Guide 100 each 3  ? losartan (COZAAR) 50 MG tablet Take 1 tablet (50 mg total) by mouth daily. 90 tablet 3  ? losartan (COZAAR) 50 MG tablet Take 1 tablet by mouth daily.    ? meloxicam (MOBIC) 7.5 MG tablet Take 1 tablet (7.5 mg total) by mouth 2 (two) times daily as needed. for pain 180 tablet 3  ? naproxen (NAPROSYN) 500 MG tablet TAKE 1 TABLET BY MOUTH TWICE DAILY WITH MEALS as needed 60 tablet 3  ? nitroGLYCERIN  (NITROSTAT) 0.4 MG SL tablet Place 0.4 mg under the tongue every 5 (five) minutes as needed for chest pain.    ? ondansetron (ZOFRAN) 4 MG tablet Take 1 tablet (4 mg total) by mouth every 8 (eight) hours as needed for nausea or vomiting. 20 tablet 1  ? OZEMPIC, 1 MG/DOSE, 4 MG/3ML SOPN Inject 1 mg into the skin once a week. 12 mL 3  ?  pantoprazole (PROTONIX) 40 MG tablet Take 1 tablet (40 mg total) by mouth daily. 90 tablet 1  ? sodium chloride (OCEAN) 0.65 % SOLN nasal spray Place 1 spray into both nostrils as needed for congestion. 88 mL 0  ? traMADol (ULTRAM) 50 MG tablet Take 1 tablet (50 mg total) by mouth every 6 (six) hours as needed. 15 tablet 0  ? triamcinolone (NASACORT) 55 MCG/ACT AERO nasal inhaler Place 2 sprays into the nose daily. (Patient taking differently: Place 1-2 sprays into the nose daily as needed (NASAL CONGESTION).)    ? metFORMIN (GLUCOPHAGE-XR) 500 MG 24 hr tablet Take 1 tablet (500 mg total) by mouth daily with breakfast. 90 tablet 3  ? ?No current facility-administered medications on file prior to visit.  ? ?     ROS:  All others reviewed and negative. ? ?Objective  ? ?     PE:  BP 130/74 (BP Location: Left Arm, Patient Position: Sitting, Cuff Size: Large)   Pulse 73   Temp 97.9 ?F (36.6 ?C) (Oral)   Ht '5\' 3"'  (1.6 m)   Wt 232 lb (105.2 kg)   SpO2 95%   BMI 41.10 kg/m?  ? ?              Constitutional: Pt appears in NAD ?              HENT: Head: NCAT.  ?              Right Ear: External ear normal.   ?              Left Ear: External ear normal.  ?              Eyes: . Pupils are equal, round, and reactive to light. Conjunctivae and EOM are normal ?              Nose: without d/c or deformity ?              Neck: Neck supple. Gross normal ROM ?              Cardiovascular: Normal rate and regular rhythm.   ?              Pulmonary/Chest: Effort normal and breath sounds without rales or wheezing.  ?              Abd:  Soft, NT, ND, + BS, no organomegaly ?              Neurological:  Pt is alert. At baseline orientation, motor grossly intact ?              Skin: Skin is warm. No rashes, no other new lesions, LE edema - none; bilat knee with bony degenerative changes, no effusion ?

## 2022-03-30 LAB — TB SKIN TEST
Induration: 0 mm
TB Skin Test: NEGATIVE

## 2022-03-30 NOTE — Progress Notes (Signed)
Patient here for PPD test to be read. TB done on 03/28/2022. Patient TB is negative  ? ? ?Please co sign  ?

## 2022-04-01 ENCOUNTER — Encounter: Payer: Self-pay | Admitting: Internal Medicine

## 2022-04-01 NOTE — Assessment & Plan Note (Signed)
Stable, to f/u endo as planned ?

## 2022-04-01 NOTE — Assessment & Plan Note (Signed)
D/w pt - for topical voltaren gel prn to knees, conssider f/u sport med for possible cortisone injecitons ?

## 2022-04-01 NOTE — Assessment & Plan Note (Signed)
Last vitamin D ?Lab Results  ?Component Value Date  ? VD25OH 36.12 03/28/2022  ? ?Low to start oral replacement ? ?

## 2022-04-01 NOTE — Assessment & Plan Note (Signed)
Age and sex appropriate education and counseling updated with regular exercise and diet ?Referrals for preventative services - refer optho for exam ?Immunizations addressed - declines covid booster, shingrix, tdap ?Smoking counseling  - none needed ?Evidence for depression or other mood disorder - none significant ?Most recent labs reviewed. ?I have personally reviewed and have noted: ?1) the patient's medical and social history ?2) The patient's current medications and supplements ?3) The patient's height, weight, and BMI have been recorded in the chart ? ?

## 2022-05-14 ENCOUNTER — Encounter: Payer: Self-pay | Admitting: Internal Medicine

## 2022-05-15 MED ORDER — MONTELUKAST SODIUM 10 MG PO TABS: 10.0000 mg | ORAL_TABLET | Freq: Every day | ORAL | 3 refills | Status: DC

## 2022-05-15 MED ORDER — PREDNISONE 10 MG PO TABS: ORAL_TABLET | ORAL | 0 refills | Status: DC

## 2022-06-18 ENCOUNTER — Other Ambulatory Visit: Payer: Self-pay

## 2022-06-18 ENCOUNTER — Emergency Department (HOSPITAL_BASED_OUTPATIENT_CLINIC_OR_DEPARTMENT_OTHER): Payer: PPO

## 2022-06-18 ENCOUNTER — Encounter (HOSPITAL_BASED_OUTPATIENT_CLINIC_OR_DEPARTMENT_OTHER): Payer: Self-pay | Admitting: Emergency Medicine

## 2022-06-18 ENCOUNTER — Emergency Department (HOSPITAL_BASED_OUTPATIENT_CLINIC_OR_DEPARTMENT_OTHER)
Admission: EM | Admit: 2022-06-18 | Discharge: 2022-06-18 | Disposition: A | Discharge status: Home / Self Care | Payer: PPO | Attending: Emergency Medicine | Admitting: Emergency Medicine

## 2022-06-18 DIAGNOSIS — Z7982 Long term (current) use of aspirin: Secondary | ICD-10-CM | POA: Diagnosis not present

## 2022-06-18 DIAGNOSIS — M5136 Other intervertebral disc degeneration, lumbar region: Secondary | ICD-10-CM | POA: Diagnosis not present

## 2022-06-18 DIAGNOSIS — M545 Low back pain, unspecified: Secondary | ICD-10-CM | POA: Insufficient documentation

## 2022-06-18 DIAGNOSIS — K760 Fatty (change of) liver, not elsewhere classified: Secondary | ICD-10-CM | POA: Diagnosis not present

## 2022-06-18 DIAGNOSIS — K753 Granulomatous hepatitis, not elsewhere classified: Secondary | ICD-10-CM | POA: Diagnosis not present

## 2022-06-18 DIAGNOSIS — M549 Dorsalgia, unspecified: Secondary | ICD-10-CM | POA: Diagnosis not present

## 2022-06-18 DIAGNOSIS — R0789 Other chest pain: Secondary | ICD-10-CM | POA: Diagnosis not present

## 2022-06-18 DIAGNOSIS — M5416 Radiculopathy, lumbar region: Secondary | ICD-10-CM | POA: Diagnosis not present

## 2022-06-18 DIAGNOSIS — Z794 Long term (current) use of insulin: Secondary | ICD-10-CM | POA: Insufficient documentation

## 2022-06-18 DIAGNOSIS — I10 Essential (primary) hypertension: Secondary | ICD-10-CM | POA: Diagnosis not present

## 2022-06-18 DIAGNOSIS — R109 Unspecified abdominal pain: Secondary | ICD-10-CM | POA: Diagnosis not present

## 2022-06-18 DIAGNOSIS — N2889 Other specified disorders of kidney and ureter: Secondary | ICD-10-CM | POA: Diagnosis not present

## 2022-06-18 MED ORDER — KETOROLAC TROMETHAMINE 15 MG/ML IJ SOLN
15.0000 mg | Freq: Once | INTRAMUSCULAR | Status: AC
  Administered 2022-06-18: 15 mg via INTRAVENOUS
  Filled 2022-06-18: qty 1

## 2022-06-18 MED ORDER — DICLOFENAC SODIUM 1 % EX GEL: 4.0000 g | Freq: Four times a day (QID) | CUTANEOUS | 0 refills | Status: AC

## 2022-06-18 MED ORDER — KETOROLAC TROMETHAMINE 60 MG/2ML IM SOLN: 15.0000 mg | Freq: Once | INTRAMUSCULAR | Status: DC

## 2022-06-18 MED ORDER — METHYLPREDNISOLONE 4 MG PO TBPK: ORAL_TABLET | ORAL | 0 refills | Status: DC

## 2022-06-18 MED ORDER — ACETAMINOPHEN 500 MG PO TABS
1000.0000 mg | ORAL_TABLET | Freq: Once | ORAL | Status: AC
  Administered 2022-06-18: 1000 mg via ORAL
  Filled 2022-06-18: qty 2

## 2022-06-18 NOTE — ED Notes (Signed)
Dc instructions and scripts reviewed with pt no questions or concerns at this time. Will follow up with pcp.  

## 2022-06-18 NOTE — ED Provider Notes (Signed)
[facility] Provider Note   CSN: [id number] Arrival date & time: 06/18/22  1203     History  Chief Complaint  Patient presents with   Back Pain    Theresa Gibson is a 66 y.o. female.  66 yo F with a chief complaints of left flank pain.  This has been going on for about a week.  Occurred after she got her shingles vaccination.  She denies any rash to the area.  Denies trauma.  Pain is worse with movement palpation and twisting.  She denies fevers or chills.  Denies cough or congestion.   Back Pain      Home Medications Prior to Admission medications   Medication Sig Start Date End Date Taking? Authorizing Provider  diclofenac Sodium (VOLTAREN) 1 % GEL Apply 4 g topically 4 (four) times daily. 06/18/22  Yes Deno Etienne, DO  methylPREDNISolone (MEDROL DOSEPAK) 4 MG TBPK tablet Day 1: 64m before breakfast, 4 mg after lunch, 4 mg after supper, and 8 mg at bedtime Day 2: 4 mg before breakfast, 4 mg after lunch, 4 mg  after supper, and 8 mg  at bedtime Day 3:  4 mg  before breakfast, 4 mg  after lunch, 4 mg after supper, and 4 mg  at bedtime Day 4: 4 mg  before breakfast, 4 mg  after lunch, and 4 mg at bedtime Day 5: 4 mg  before breakfast and 4 mg at bedtime Day 6: 4 mg  before breakfast 06/18/22  Yes FDeno Etienne DO  acetaminophen (TYLENOL) 500 MG tablet Take 2 tablets (1,000 mg total) by mouth every 8 (eight) hours as needed for moderate pain. 09/30/17   Nche, CCharlene Brooke NP  albuterol (VENTOLIN HFA) 108 (90 Base) MCG/ACT inhaler INHALE 2 PUFFS BY MOUTH EVERY 4 TO 6 HOURS AS NEEDED FOR SHORTNESS OF BREATH WHEEZING OR COUGHING 12/20/20   JBiagio Borg MD  aspirin EC 81 MG tablet Take 81 mg by mouth daily.    [provider]  atorvastatin (LIPITOR) 40 MG tablet Take 1 tablet (40 mg total) by mouth daily at 6 PM. 09/06/21   JBiagio Borg MD  Blood Glucose Monitoring Suppl (ACCU-CHEK GUIDE ME) w/Device KIT Use as directed four times daily E11.9  10/04/20   JBiagio Borg MD  buPROPion ([facility]) 150 MG 12 hr tablet Take 1 tablet by mouth twice daily 02/02/22   JBiagio Borg MD  carvedilol (COREG) 3.125 MG tablet Take 3.125 mg by mouth 2 (two) times daily with a meal.    [provider]  cetirizine (ZYRTEC) 10 MG tablet Take 10 mg by mouth daily as needed for allergies.    [provider]  cholecalciferol (VITAMIN D) 1000 units tablet Take 1,000 Units by mouth daily.    [provider]  cyanocobalamin 1000 MCG tablet Take by mouth.    [provider]  famotidine (PEPCID) 20 MG tablet Take 1 tablet (20 mg total) by mouth 2 (two) times daily. 07/07/21   JBiagio Borg MD  fluconazole (DIFLUCAN) 150 MG tablet 1 tab by mouth every 3 days as needed 01/17/22   JBiagio Borg MD  fluticasone ([facility] 50 MCG/ACT nasal spray Place 2 sprays into both nostrils daily. 12/20/17   Palumbo, April, MD  glucose blood (ONETOUCH VERIO) test strip USE 1 STRIP TO CHECK GLUCOSE TWICE DAILY AS DIRECTED 12/26/21   JBiagio Borg MD  JARDIANCE 10 MG TABS tablet Take 1 tablet (  10 mg total) by mouth daily. 11/02/21   Biagio Borg, MD  ketoconazole (NIZORAL) 2 % cream APPLY  CREAM TOPICALLY TO VAGINAL AREA TWICE DAILY FOR 10 DAYS 01/17/22   Biagio Borg, MD  Lancets MISC Use as directed once daily E11.9 Accu-Chek Guide 01/01/22   Biagio Borg, MD  losartan (COZAAR) 50 MG tablet Take 1 tablet (50 mg total) by mouth daily. 07/28/18   Dragnev, [name], NP  losartan (COZAAR) 50 MG tablet Take 1 tablet by mouth daily. 05/22/21   [provider]  meloxicam (MOBIC) 7.5 MG tablet Take 1 tablet (7.5 mg total) by mouth 2 (two) times daily as needed. for pain 08/03/21   Biagio Borg, MD  metFORMIN (GLUCOPHAGE-XR) 500 MG 24 hr tablet Take 1 tablet (500 mg total) by mouth daily with breakfast. 10/17/21   Biagio Borg, MD  montelukast (SINGULAIR) 10 MG tablet Take 1 tablet (10 mg total) by mouth at bedtime. 05/15/22   Biagio Borg,  MD  naproxen (NAPROSYN) 500 MG tablet TAKE 1 TABLET BY MOUTH TWICE DAILY WITH MEALS as needed 02/16/20   Biagio Borg, MD  nitroGLYCERIN (NITROSTAT) 0.4 MG SL tablet Place 0.4 mg under the tongue every 5 (five) minutes as needed for chest pain.    [provider]  ondansetron (ZOFRAN) 4 MG tablet Take 1 tablet (4 mg total) by mouth every 8 (eight) hours as needed for nausea or vomiting. 12/20/20   Biagio Borg, MD  OZEMPIC, 1 MG/DOSE, 4 MG/3ML SOPN Inject 1 mg into the skin once a week. 11/02/21   Biagio Borg, MD  pantoprazole (PROTONIX) 40 MG tablet Take 1 tablet (40 mg total) by mouth daily. 07/03/21   Biagio Borg, MD  predniSONE (DELTASONE) 10 MG tablet 3 tabs by mouth per day for 3 days,2tabs per day for 3 days,1tab per day for 3 days 05/15/22   Biagio Borg, MD  sodium chloride (OCEAN) 0.65 % SOLN nasal spray Place 1 spray into both nostrils as needed for congestion. 12/19/20   Horton, Barbette Hair, MD  traMADol (ULTRAM) 50 MG tablet Take 1 tablet (50 mg total) by mouth every 6 (six) hours as needed. 06/26/18   Ashley Murrain, NP  triamcinolone (NASACORT) 55 MCG/ACT AERO nasal inhaler Place 2 sprays into the nose daily. Patient taking differently: Place 1-2 sprays into the nose daily as needed (NASAL CONGESTION). 01/04/17   Molpus, John, MD      Allergies    Nicotine and Ace inhibitors    Review of Systems   Review of Systems  Musculoskeletal:  Positive for back pain.    Physical Exam Updated Vital Signs BP 133/65   Pulse 65   Temp 98.4 F (36.9 C) (Oral)   Resp 18   Ht '5\' 3"'  (1.6 m)   Wt 105.7 kg   SpO2 98%   BMI 41.27 kg/m  Physical Exam Vitals and nursing note reviewed.  Constitutional:      General: She is not in acute distress.    Appearance: She is well-developed. She is not diaphoretic.  HENT:     Head: Normocephalic and atraumatic.  Eyes:     Pupils: Pupils are equal, round, and reactive to light.  Cardiovascular:     Rate and Rhythm: Normal rate and regular  rhythm.     Heart sounds: No murmur heard.    No friction rub. No gallop.  Pulmonary:     Effort: Pulmonary effort  is normal.     Breath sounds: No wheezing or rales.  Abdominal:     General: There is no distension.     Palpations: Abdomen is soft.     Tenderness: There is no abdominal tenderness.  Musculoskeletal:        General: Tenderness present.     Cervical back: Normal range of motion and neck supple.     Comments: Pain along the left side of the back.  Has a focal tenderness about T11-12 in the paravertebral musculature.  No obvious CVA tenderness to percussion.  Benign abdominal exam.  No midline tenderness step-offs or deformities.  Skin:    General: Skin is warm and dry.  Neurological:     Mental Status: She is alert and oriented to person, place, and time.  Psychiatric:        Behavior: Behavior normal.     ED Results / Procedures / Treatments   Labs (all labs ordered are listed, but only abnormal results are displayed) Labs Reviewed - No data to display  EKG EKG Interpretation  Date/Time:  Monday June 18 2022 12:14:39 EDT Ventricular Rate:  66 PR Interval:  129 QRS Duration: 82 QT Interval:  406 QTC Calculation: 426 R Axis:   31 Text Interpretation: Sinus rhythm No significant change since last tracing Confirmed by Deno Etienne [phone]) on 06/18/2022 12:16:02 PM  Radiology CT L-SPINE NO CHARGE  Result Date: 06/18/2022 CLINICAL DATA:  Left-sided chest and back pain after vaccination last week. EXAM: CT LUMBAR SPINE WITHOUT CONTRAST TECHNIQUE: Multidetector CT imaging of the lumbar spine was performed without intravenous contrast administration. Multiplanar CT image reconstructions were also generated. RADIATION DOSE REDUCTION: This exam was performed according to the departmental dose-optimization program which includes automated exposure control, adjustment of the mA and/or kV according to patient size and/or use of iterative reconstruction technique. COMPARISON:   Radiography 09/18/2013 FINDINGS: Segmentation: Five lumbar type vertebral bodies. Alignment: Normal except for 2 mm of degenerative anterolisthesis at L3-4. Vertebrae: No fracture or focal bone lesion. Paraspinal and other soft tissues: See results of abdominal CT. Disc levels: No disc level abnormality from T11-12 through L1-2 L2-3: Minimal disc bulge towards the right. No compressive canal or foraminal narrowing minimal facet osteoarthritis. L3-4: Bilateral facet osteoarthritis with 2 mm of anterolisthesis. Mild bulging of the disc. Mild stenosis of both lateral recesses and foramina but without definite neural compression. Findings at this level could be symptomatic. L4-5: Bilateral facet osteoarthritis but without anterolisthesis in this position. Circumferential bulging of the disc. Mild stenosis of both lateral recesses and neural foramina but without definite neural compression. Findings at this level could be symptomatic L5-S1: Mild bulging of the disc. Mild facet osteoarthritis. No canal or foraminal stenosis. There is bilateral sacroiliac osteoarthritis as well. IMPRESSION: Lower lumbar degenerative disc disease and degenerative facet disease . The findings are most pronounced at L3-4 where there is 2 mm of anterolisthesis in this position because of the facet degeneration. Mild stenosis of both lateral recesses and neural foramina present at this level. Similar findings are present at L4-5, but without a minimal slippage. Lateral recess and foraminal narrowing of a mild degree also at this level. At L5-S1, there is a minimal disc bulge and mild facet degeneration no stenosis. Bilateral sacroiliac osteoarthritis also demonstrated. Electronically Signed   By: Nelson Chimes M.D.   On: 06/18/2022 13:18   CT Renal Stone Study  Result Date: 06/18/2022 CLINICAL DATA:  Left-sided back and chest pain. EXAM: CT ABDOMEN AND  PELVIS WITHOUT CONTRAST TECHNIQUE: Multidetector CT imaging of the abdomen and pelvis was  performed following the standard protocol without IV contrast. RADIATION DOSE REDUCTION: This exam was performed according to the departmental dose-optimization program which includes automated exposure control, adjustment of the mA and/or kV according to patient size and/or use of iterative reconstruction technique. COMPARISON:  CT December 10, 2015. FINDINGS: Lower chest: No acute abnormality. Hepatobiliary: Diffuse hepatic steatosis. Calcified hepatic granulomata. Gallbladder is unremarkable. No biliary ductal dilation. Pancreas: No pancreatic ductal dilation or evidence of acute inflammation. Spleen: No splenomegaly or focal splenic lesion. Adrenals/Urinary Tract: Bilateral adrenal glands appear normal. No hydronephrosis. No renal, obstructive ureteral or bladder calculi identified. Tiny calcifications along the course of the ureters many of which were present on prior CT and none of which demonstrate a halo of soft tissue and are favored to reflect phleboliths. Urinary bladder is nondistended limiting evaluation. Stomach/Bowel: No radiopaque enteric contrast material was administered. Stomach is unremarkable for degree of distension. No pathologic dilation of small or large bowel. No evidence of acute bowel inflammation. Vascular/Lymphatic: Aortic atherosclerosis without aneurysmal dilation. No pathologic enlarged abdominal or pelvic lymph nodes. Reproductive: Status post hysterectomy. No adnexal masses. Other: No significant abdominopelvic free fluid. Musculoskeletal: Multilevel degenerative changes spine. No acute osseous abnormality. IMPRESSION: 1. No hydronephrosis. No renal, obstructive ureteral or bladder calculus identified. 2. Diffuse hepatic steatosis which in the setting of steatohepatitis can be a cause of abdominal pain. 3.  Aortic Atherosclerosis (ICD10-I70.0). Electronically Signed   By: Dahlia Bailiff M.D.   On: 06/18/2022 13:11    Procedures Procedures    Medications Ordered in  ED Medications  acetaminophen (TYLENOL) tablet 1,000 mg (1,000 mg Oral Given 06/18/22 1233)  ketorolac (TORADOL) 15 MG/ML injection 15 mg (15 mg Intravenous Given 06/18/22 1233)    ED Course/ Medical Decision Making/ A&P                           Medical Decision Making Amount and/or Complexity of Data Reviewed Radiology: ordered.  Risk OTC drugs. Prescription drug management.   47 y F with a chief complaints of left flank pain.  This been going on for about a week.  Sounds musculoskeletal by history and physical worse by movement twisting palpation.  Just had a shingles vaccination prior to the onset.  Seems unlikely to be varicella activation, no rash though is in a dermatomal distribution.  I think much more likely a radiculopathy from a bulging disc.  CT stone study without kidney stone no obvious intra-abdominal pathology.  CT of the L-spine without fracture or other concerning pathology.  We will treat with a steroid Dosepak.  Topical diclofenac.  PCP follow-up.  2:07 PM:  I have discussed the diagnosis/risks/treatment options with the patient.  Evaluation and diagnostic testing in the emergency department does not suggest an emergent condition requiring admission or immediate intervention beyond what has been performed at this time.  They will follow up with  PCP. We also discussed returning to the ED immediately if new or worsening sx occur. We discussed the sx which are most concerning (e.g., sudden worsening pain, fever, inability to tolerate by mouth) that necessitate immediate return. Medications administered to the patient during their visit and any new prescriptions provided to the patient are listed below.  Medications given during this visit Medications  acetaminophen (TYLENOL) tablet 1,000 mg (1,000 mg Oral Given 06/18/22 1233)  ketorolac (TORADOL) 15 MG/ML injection 15 mg (15 mg  Intravenous Given 06/18/22 1233)     The patient appears reasonably screen and/or stabilized for  discharge and I doubt any other medical condition or other [facility] requiring further screening, evaluation, or treatment in the ED at this time prior to discharge.          Final Clinical Impression(s) / ED Diagnoses Final diagnoses:  Lumbar radicular pain    Rx / DC Orders ED Discharge Orders          Ordered    methylPREDNISolone (MEDROL DOSEPAK) 4 MG TBPK tablet        06/18/22 1323    diclofenac Sodium (VOLTAREN) 1 % GEL  4 times daily        06/18/22 Soso, Pennelope Basque, DO 06/18/22 1408

## 2022-06-18 NOTE — Discharge Instructions (Signed)
Your back pain is most likely due to a muscular strain.  There is been a lot of research on back pain, unfortunately the only thing that seems to really help is Tylenol and ibuprofen.  Relative rest is also important to not lift greater than 10 pounds bending or twisting at the waist.  Please follow-up with your family physician.  The other thing that really seems to benefit patients is physical therapy which your doctor may send you for.  Please return to the emergency department for new numbness or weakness to your arms or legs. Difficulty with urinating or urinating or pooping on yourself.  Also if you cannot feel toilet paper when you wipe or get a fever.   Use the steroids and gel as prescribed.  Also take tylenol '1000mg'$ (2 extra strength) four times a day.

## 2022-06-18 NOTE — ED Triage Notes (Signed)
Pt having left sided back/chest pain after getting a shingles vaccine last week.  Increased pain with movement.  Pt felt sob 2 days.  Some chills.

## 2022-07-03 DIAGNOSIS — G8929 Other chronic pain: Secondary | ICD-10-CM | POA: Diagnosis not present

## 2022-07-03 DIAGNOSIS — I10 Essential (primary) hypertension: Secondary | ICD-10-CM | POA: Diagnosis not present

## 2022-07-03 DIAGNOSIS — Z6841 Body Mass Index (BMI) 40.0 and over, adult: Secondary | ICD-10-CM | POA: Diagnosis not present

## 2022-07-03 DIAGNOSIS — E1169 Type 2 diabetes mellitus with other specified complication: Secondary | ICD-10-CM | POA: Diagnosis not present

## 2022-07-03 DIAGNOSIS — F3341 Major depressive disorder, recurrent, in partial remission: Secondary | ICD-10-CM | POA: Diagnosis not present

## 2022-07-03 DIAGNOSIS — E559 Vitamin D deficiency, unspecified: Secondary | ICD-10-CM | POA: Diagnosis not present

## 2022-07-03 DIAGNOSIS — B379 Candidiasis, unspecified: Secondary | ICD-10-CM | POA: Diagnosis not present

## 2022-07-03 DIAGNOSIS — J309 Allergic rhinitis, unspecified: Secondary | ICD-10-CM | POA: Diagnosis not present

## 2022-07-03 DIAGNOSIS — I252 Old myocardial infarction: Secondary | ICD-10-CM | POA: Diagnosis not present

## 2022-07-03 DIAGNOSIS — I25118 Atherosclerotic heart disease of native coronary artery with other forms of angina pectoris: Secondary | ICD-10-CM | POA: Diagnosis not present

## 2022-07-03 DIAGNOSIS — E785 Hyperlipidemia, unspecified: Secondary | ICD-10-CM | POA: Diagnosis not present

## 2022-07-18 DIAGNOSIS — E1165 Type 2 diabetes mellitus with hyperglycemia: Secondary | ICD-10-CM | POA: Diagnosis not present

## 2022-07-18 DIAGNOSIS — Z87891 Personal history of nicotine dependence: Secondary | ICD-10-CM | POA: Diagnosis not present

## 2022-07-18 DIAGNOSIS — E1140 Type 2 diabetes mellitus with diabetic neuropathy, unspecified: Secondary | ICD-10-CM | POA: Diagnosis not present

## 2022-07-18 DIAGNOSIS — Z7984 Long term (current) use of oral hypoglycemic drugs: Secondary | ICD-10-CM | POA: Diagnosis not present

## 2022-07-18 DIAGNOSIS — I252 Old myocardial infarction: Secondary | ICD-10-CM | POA: Diagnosis not present

## 2022-08-20 DIAGNOSIS — I2510 Atherosclerotic heart disease of native coronary artery without angina pectoris: Secondary | ICD-10-CM | POA: Diagnosis not present

## 2022-08-20 DIAGNOSIS — I10 Essential (primary) hypertension: Secondary | ICD-10-CM | POA: Diagnosis not present

## 2022-08-20 DIAGNOSIS — Z6839 Body mass index (BMI) 39.0-39.9, adult: Secondary | ICD-10-CM | POA: Diagnosis not present

## 2022-08-20 DIAGNOSIS — E782 Mixed hyperlipidemia: Secondary | ICD-10-CM | POA: Diagnosis not present

## 2022-08-20 DIAGNOSIS — E119 Type 2 diabetes mellitus without complications: Secondary | ICD-10-CM | POA: Diagnosis not present

## 2022-09-24 ENCOUNTER — Other Ambulatory Visit: Payer: Self-pay | Admitting: Internal Medicine

## 2022-11-19 ENCOUNTER — Encounter (HOSPITAL_BASED_OUTPATIENT_CLINIC_OR_DEPARTMENT_OTHER): Payer: Self-pay | Admitting: Urology

## 2022-11-19 ENCOUNTER — Emergency Department (HOSPITAL_BASED_OUTPATIENT_CLINIC_OR_DEPARTMENT_OTHER): Payer: PPO

## 2022-11-19 ENCOUNTER — Other Ambulatory Visit: Payer: Self-pay

## 2022-11-19 ENCOUNTER — Emergency Department (HOSPITAL_BASED_OUTPATIENT_CLINIC_OR_DEPARTMENT_OTHER)
Admission: EM | Admit: 2022-11-19 | Discharge: 2022-11-19 | Disposition: A | Discharge status: Home / Self Care | Payer: PPO | Attending: Emergency Medicine | Admitting: Emergency Medicine

## 2022-11-19 DIAGNOSIS — R0789 Other chest pain: Secondary | ICD-10-CM | POA: Diagnosis not present

## 2022-11-19 DIAGNOSIS — Z955 Presence of coronary angioplasty implant and graft: Secondary | ICD-10-CM | POA: Diagnosis not present

## 2022-11-19 DIAGNOSIS — R079 Chest pain, unspecified: Secondary | ICD-10-CM | POA: Diagnosis not present

## 2022-11-19 DIAGNOSIS — J9811 Atelectasis: Secondary | ICD-10-CM | POA: Diagnosis not present

## 2022-11-19 DIAGNOSIS — F17200 Nicotine dependence, unspecified, uncomplicated: Secondary | ICD-10-CM | POA: Insufficient documentation

## 2022-11-19 LAB — TROPONIN I (HIGH SENSITIVITY)
Troponin I (High Sensitivity): 2 ng/L (ref <18)
Troponin I (High Sensitivity): 2 ng/L (ref <18)

## 2022-11-19 LAB — CBC
HCT: 41.1 % (ref 36.0–46.0)
Hemoglobin: 13.5 g/dL (ref 12.0–15.0)
MCH: 27.2 pg (ref 26.0–34.0)
MCHC: 32.8 g/dL (ref 30.0–36.0)
MCV: 82.9 fL (ref 80.0–100.0)
Platelets: 278 10*3/uL (ref 150–400)
RBC: 4.96 MIL/uL (ref 3.87–5.11)
RDW: 15 % (ref 11.5–15.5)
WBC: 7 10*3/uL (ref 4.0–10.5)
nRBC: 0 % (ref 0.0–0.2)

## 2022-11-19 LAB — BASIC METABOLIC PANEL
Anion gap: 7 (ref 5–15)
BUN: 18 mg/dL (ref 8–23)
CO2: 24 mmol/L (ref 22–32)
Calcium: 8.7 mg/dL — ABNORMAL LOW (ref 8.9–10.3)
Chloride: 110 mmol/L (ref 98–111)
Creatinine, Ser: 0.79 mg/dL (ref 0.44–1.00)
GFR, Estimated: >60 mL/min (ref >60)
Glucose, Bld: 120 mg/dL — ABNORMAL HIGH (ref 70–99)
Potassium: 3.5 mmol/L (ref 3.5–5.1)
Sodium: 141 mmol/L (ref 135–145)

## 2022-11-19 MED ORDER — FAMOTIDINE 20 MG PO TABS
20.0000 mg | ORAL_TABLET | Freq: Once | ORAL | Status: AC
  Administered 2022-11-19: 20 mg via ORAL
  Filled 2022-11-19: qty 1

## 2022-11-19 MED ORDER — ACETAMINOPHEN 500 MG PO TABS
1000.0000 mg | ORAL_TABLET | Freq: Once | ORAL | Status: AC
Start: 2022-11-19 — End: 2022-11-19
  Administered 2022-11-19: 1000 mg via ORAL
  Filled 2022-11-19: qty 2

## 2022-11-19 NOTE — ED Triage Notes (Signed)
Pt states left sided chest pain that started this am  States "feels like labor pain" is intermittent  States mild SOB with exertion   H/o MI

## 2022-11-19 NOTE — ED Provider Notes (Signed)
[facility] Provider Note   CSN: [id number] Arrival date & time: 11/19/22  1558     History Chief Complaint  Patient presents with   Chest Pain    HPI Theresa Gibson is a 66 y.o. female presenting for intermittent chest pain.  She endorses that throughout the day today she has had intermittent left-sided chest pain.  She states this feels very different from her prior heart attacks as with her heart attack she had no symptoms. She feels like this is more musculoskeletal but has concerned because of her history.  She endorses a history of multiple stents following with cardiology in the outpatient setting.   Patient's recorded medical, surgical, social, medication list and allergies were reviewed in the Snapshot window as part of the initial history.   Review of Systems   Review of Systems  Constitutional:  Negative for chills and fever.  HENT:  Negative for ear pain and sore throat.   Eyes:  Negative for pain and visual disturbance.  Respiratory:  Negative for cough and shortness of breath.   Cardiovascular:  Positive for chest pain. Negative for palpitations.  Gastrointestinal:  Negative for abdominal pain and vomiting.  Genitourinary:  Negative for dysuria and hematuria.  Musculoskeletal:  Negative for arthralgias and back pain.  Skin:  Negative for color change and rash.  Neurological:  Negative for seizures and syncope.  All other systems reviewed and are negative.   Physical Exam Updated Vital Signs BP (!) 174/81   Pulse 72   Temp 97.7 F (36.5 C)   Resp 18   Ht '5\' 3"'$  (1.6 m)   Wt 105.7 kg   SpO2 100%   BMI 41.28 kg/m  Physical Exam Vitals and nursing note reviewed.  Constitutional:      General: She is not in acute distress.    Appearance: She is well-developed.  HENT:     Head: Normocephalic and atraumatic.  Eyes:     Conjunctiva/sclera: Conjunctivae normal.  Cardiovascular:     Rate and Rhythm: Normal rate and regular  rhythm.     Heart sounds: No murmur heard. Pulmonary:     Effort: Pulmonary effort is normal. No respiratory distress.     Breath sounds: Normal breath sounds.  Abdominal:     General: There is no distension.     Palpations: Abdomen is soft.     Tenderness: There is no abdominal tenderness. There is no right CVA tenderness or left CVA tenderness.  Musculoskeletal:        General: No swelling or tenderness. Normal range of motion.     Cervical back: Neck supple.  Skin:    General: Skin is warm and dry.  Neurological:     General: No focal deficit present.     Mental Status: She is alert and oriented to person, place, and time. Mental status is at baseline.     Cranial Nerves: No cranial nerve deficit.      ED Course/ Medical Decision Making/ A&P    Procedures Procedures   Medications Ordered in ED Medications  acetaminophen (TYLENOL) tablet 1,000 mg (1,000 mg Oral Given 11/19/22 2041)  famotidine (PEPCID) tablet 20 mg (20 mg Oral Given 11/19/22 2041)   Medical Decision Making: Theresa Gibson is a 66 y.o. female who presented to the ED today with chest pain, detailed above.  Based on patient's comorbidities, patient has a baseline high risk for ACS.    Patient's presentation is complicated by their history  of multiple comorbid medical problems.  Patient placed on continuous vitals and telemetry monitoring while in ED which was reviewed periodically.  Complete initial physical exam performed, notably the patient was hemodynamically stable in no acute distress.   Reviewed and confirmed nursing documentation for past medical history, family history, social history.    Initial Assessment: With the patient's presentation of left-sided chest pain, most likely diagnosis is musculoskeletal chest pain versus GERD, although ACS remains on the differential. Other diagnoses were considered including (but not limited to) pulmonary embolism, community-acquired pneumonia, aortic dissection,  pneumothorax, underlying bony abnormality, anemia. These are considered less likely due to history of present illness and physical exam findings.    In particular, concerning pulmonary embolism: Patient denies malignancy, recent surgery, history of DVT, or calf tenderness leading to a low risk Wells score. Aortic Dissection also reconsidered but seems less likely based on the location, quality, onset, and severity of symptoms in this case. Patient has a lack of serious comorbidities for this condition including a lack of Smoking. Patient also has a lack of underlying history of AD or TAA.  This is most consistent with an acute life/limb threatening illness complicated by underlying chronic conditions.   Initial Plan: Evaluate for ACS with delta troponin and EKG evaluated as below  Evaluate for dissection, bony abnormality, or pneumonia with chest x-ray and screening laboratory evaluation including CBC, BMP  Further evaluation for pulmonary embolism not indicated at this time based on patient's PERC and Wells score.  Further evaluation for Thoracic Aortic Dissection not indicated at this time based on patient's clinical history and PE findings.   Initial Study Results: EKG was reviewed independently. Rate, rhythm, axis, intervals all examined and without medically relevant abnormality. ST segments without concerns for elevations.    Laboratory  Delta  troponin demonstrated no acute abnormality  CBC and BMP without obvious metabolic or inflammatory abnormalities requiring further evaluation   Radiology  DG Chest 2 View  Result Date: 11/19/2022 CLINICAL DATA:  Chest pain. EXAM: CHEST - 2 VIEW COMPARISON:  Chest x-ray June 6 21. FINDINGS: The heart size and mediastinal contours are within normal limits. Subsegmental left midlung atelectasis. No visible pleural effusions or pneumothorax. IMPRESSION: Mild subsegmental left midlung atelectasis. Otherwise, no acute abnormality. Electronically Signed    By: Margaretha Sheffield M.D.   On: 11/19/2022 16:46    Final Assessment and Plan: Patient was observed in emergency department for 5-1/2 hours with serial troponins without any appreciable change. Symptoms grossly resolved on reassessment.  Had a prolonged conversation with the patient.  Given her elevated baseline risk for CAD and ACS, she could warrant admission for chest pain observation.  However since her symptoms have resolved, she would prefer to be discharged to follow-up with her primary care provider in the outpatient setting.  Overall this is reasonable given her well appearance.  She will need to return if she has any recurrence of her symptoms. Disposition:  I have considered need for hospitalization, however, considering all of the above, I believe this patient is stable for discharge at this time.  Patient/family educated about specific return precautions for given chief complaint and symptoms.  Patient/family educated about follow-up with PCP.     Patient/family expressed understanding of return precautions and need for follow-up. Patient spoken to regarding all imaging and laboratory results and appropriate follow up for these results. All education provided in verbal form with additional information in written form. Time was allowed for answering of patient questions. Patient  discharged.    Emergency Department Medication Summary:   Medications  acetaminophen (TYLENOL) tablet 1,000 mg (1,000 mg Oral Given 11/19/22 2041)  famotidine (PEPCID) tablet 20 mg (20 mg Oral Given 11/19/22 2041)                  Clinical Impression:  1. Chest pain, unspecified type      Discharge   Final Clinical Impression(s) / ED Diagnoses Final diagnoses:  Chest pain, unspecified type    Rx / DC Orders ED Discharge Orders     None         Tretha Sciara, MD 11/19/22 2126

## 2022-12-05 ENCOUNTER — Ambulatory Visit: Payer: PPO | Admitting: Internal Medicine

## 2022-12-13 ENCOUNTER — Other Ambulatory Visit: Payer: Self-pay | Admitting: Internal Medicine

## 2022-12-13 DIAGNOSIS — Z1231 Encounter for screening mammogram for malignant neoplasm of breast: Secondary | ICD-10-CM

## 2022-12-18 DIAGNOSIS — Z7984 Long term (current) use of oral hypoglycemic drugs: Secondary | ICD-10-CM | POA: Diagnosis not present

## 2022-12-18 DIAGNOSIS — E1159 Type 2 diabetes mellitus with other circulatory complications: Secondary | ICD-10-CM | POA: Diagnosis not present

## 2022-12-18 DIAGNOSIS — E1140 Type 2 diabetes mellitus with diabetic neuropathy, unspecified: Secondary | ICD-10-CM | POA: Diagnosis not present

## 2022-12-18 DIAGNOSIS — E1165 Type 2 diabetes mellitus with hyperglycemia: Secondary | ICD-10-CM | POA: Diagnosis not present

## 2022-12-18 DIAGNOSIS — B3731 Acute candidiasis of vulva and vagina: Secondary | ICD-10-CM | POA: Diagnosis not present

## 2022-12-18 DIAGNOSIS — I252 Old myocardial infarction: Secondary | ICD-10-CM | POA: Diagnosis not present

## 2022-12-24 DIAGNOSIS — M25562 Pain in left knee: Secondary | ICD-10-CM | POA: Insufficient documentation

## 2022-12-24 DIAGNOSIS — M170 Bilateral primary osteoarthritis of knee: Secondary | ICD-10-CM | POA: Insufficient documentation

## 2022-12-24 DIAGNOSIS — M25561 Pain in right knee: Secondary | ICD-10-CM | POA: Insufficient documentation

## 2022-12-27 ENCOUNTER — Ambulatory Visit: Payer: PPO

## 2022-12-29 ENCOUNTER — Emergency Department (HOSPITAL_BASED_OUTPATIENT_CLINIC_OR_DEPARTMENT_OTHER)
Admission: EM | Admit: 2022-12-29 | Discharge: 2022-12-29 | Disposition: A | Discharge status: Home / Self Care | Payer: PPO | Attending: Emergency Medicine | Admitting: Emergency Medicine

## 2022-12-29 ENCOUNTER — Other Ambulatory Visit: Payer: Self-pay

## 2022-12-29 DIAGNOSIS — U071 COVID-19: Secondary | ICD-10-CM | POA: Insufficient documentation

## 2022-12-29 DIAGNOSIS — E119 Type 2 diabetes mellitus without complications: Secondary | ICD-10-CM | POA: Diagnosis not present

## 2022-12-29 DIAGNOSIS — Z7984 Long term (current) use of oral hypoglycemic drugs: Secondary | ICD-10-CM | POA: Insufficient documentation

## 2022-12-29 DIAGNOSIS — Z79899 Other long term (current) drug therapy: Secondary | ICD-10-CM | POA: Diagnosis not present

## 2022-12-29 DIAGNOSIS — Z7982 Long term (current) use of aspirin: Secondary | ICD-10-CM | POA: Diagnosis not present

## 2022-12-29 DIAGNOSIS — I10 Essential (primary) hypertension: Secondary | ICD-10-CM | POA: Diagnosis not present

## 2022-12-29 DIAGNOSIS — R0981 Nasal congestion: Secondary | ICD-10-CM | POA: Diagnosis present

## 2022-12-29 LAB — RESP PANEL BY RT-PCR (RSV, FLU A&B, COVID)  RVPGX2
Influenza A by PCR: NEGATIVE
Influenza B by PCR: NEGATIVE
Resp Syncytial Virus by PCR: NEGATIVE
SARS Coronavirus 2 by RT PCR: POSITIVE — AB

## 2022-12-29 MED ORDER — BENZONATATE 100 MG PO CAPS: 100.0000 mg | ORAL_CAPSULE | Freq: Three times a day (TID) | ORAL | 0 refills | Status: DC

## 2022-12-29 NOTE — ED Provider Notes (Signed)
[facility] Provider Note   CSN: [id number] Arrival date & time: 12/29/22  1231     History  Chief Complaint  Patient presents with   Nasal Congestion    Theresa Gibson is a 67 y.o. female with a past medical history significant for hypertension and diabetes who presents to the ED due to nasal congestion, sore throat, headache, and loss of taste that started last night.  Patient's mother tested positive for COVID-19.  Denies chest pain and shortness of breath.  No abdominal pain, nausea, vomiting, or diarrhea.  Patient is up-to-date with her COVID-19 vaccines.   History obtained from patient and past medical records. No interpreter used during encounter.       Home Medications Prior to Admission medications   Medication Sig Start Date End Date Taking? Authorizing Provider  benzonatate (TESSALON) 100 MG capsule Take 1 capsule (100 mg total) by mouth every 8 (eight) hours. 12/29/22  Yes Lashondra Vaquerano, Druscilla Brownie, PA-C  acetaminophen (TYLENOL) 500 MG tablet Take 2 tablets (1,000 mg total) by mouth every 8 (eight) hours as needed for moderate pain. 09/30/17   Nche, Charlene Brooke, NP  albuterol (VENTOLIN HFA) 108 (90 Base) MCG/ACT inhaler INHALE 2 PUFFS BY MOUTH EVERY 4 TO 6 HOURS AS NEEDED FOR SHORTNESS OF BREATH WHEEZING OR COUGHING 12/20/20   Biagio Borg, MD  aspirin EC 81 MG tablet Take 81 mg by mouth daily.    [provider]  atorvastatin (LIPITOR) 40 MG tablet Take 1 tablet (40 mg total) by mouth daily at 6 PM. 09/06/21   Biagio Borg, MD  Blood Glucose Monitoring Suppl (ACCU-CHEK GUIDE ME) w/Device KIT Use as directed four times daily E11.9 10/04/20   Biagio Borg, MD  buPROPion [facility] SR) 150 MG 12 hr tablet Take 1 tablet by mouth twice daily 02/02/22   Biagio Borg, MD  carvedilol (COREG) 3.125 MG tablet Take 3.125 mg by mouth 2 (two) times daily with a meal.    [provider]  cetirizine (ZYRTEC) 10 MG tablet Take  10 mg by mouth daily as needed for allergies.    [provider]  cholecalciferol (VITAMIN D) 1000 units tablet Take 1,000 Units by mouth daily.    [provider]  cyanocobalamin 1000 MCG tablet Take by mouth.    [provider]  diclofenac Sodium (VOLTAREN) 1 % GEL Apply 4 g topically 4 (four) times daily. 06/18/22   Deno Etienne, DO  famotidine (PEPCID) 20 MG tablet Take 1 tablet (20 mg total) by mouth 2 (two) times daily. 07/07/21   Biagio Borg, MD  fluconazole (DIFLUCAN) 150 MG tablet 1 tab by mouth every 3 days as needed 01/17/22   Biagio Borg, MD  fluticasone [facility]) 50 MCG/ACT nasal spray Place 2 sprays into both nostrils daily. 12/20/17   Palumbo, April, MD  glucose blood (ONETOUCH VERIO) test strip USE 1 STRIP TO CHECK GLUCOSE TWICE DAILY AS DIRECTED 12/26/21   Biagio Borg, MD  JARDIANCE 10 MG TABS tablet Take 1 tablet (10 mg total) by mouth daily. 11/02/21   Biagio Borg, MD  ketoconazole (NIZORAL) 2 % cream APPLY  CREAM TOPICALLY TO VAGINAL AREA TWICE DAILY FOR 10 DAYS 01/17/22   Biagio Borg, MD  Lancets MISC Use as directed once daily E11.9 Accu-Chek Guide 01/01/22   Biagio Borg, MD  losartan (COZAAR) 50 MG tablet Take 1 tablet (50 mg total) by mouth daily. 07/28/18  Dragnev, [name], NP  losartan (COZAAR) 50 MG tablet Take 1 tablet by mouth daily. 05/22/21   [provider]  meloxicam (MOBIC) 7.5 MG tablet Take 1 tablet by mouth twice daily as needed for pain 09/24/22   Biagio Borg, MD  metFORMIN (GLUCOPHAGE-XR) 500 MG 24 hr tablet Take 1 tablet (500 mg total) by mouth daily with breakfast. 10/17/21   Biagio Borg, MD  methylPREDNISolone (MEDROL DOSEPAK) 4 MG TBPK tablet Day 1: '8mg'$  before breakfast, 4 mg after lunch, 4 mg after supper, and 8 mg at bedtime Day 2: 4 mg before breakfast, 4 mg after lunch, 4 mg  after supper, and 8 mg  at bedtime Day 3:  4 mg  before breakfast, 4 mg  after lunch, 4 mg after supper, and 4 mg  at bedtime Day 4:  4 mg  before breakfast, 4 mg  after lunch, and 4 mg at bedtime Day 5: 4 mg  before breakfast and 4 mg at bedtime Day 6: 4 mg  before breakfast 06/18/22   Deno Etienne, DO  montelukast (SINGULAIR) 10 MG tablet Take 1 tablet (10 mg total) by mouth at bedtime. 05/15/22   Biagio Borg, MD  naproxen (NAPROSYN) 500 MG tablet TAKE 1 TABLET BY MOUTH TWICE DAILY WITH MEALS as needed 02/16/20   Biagio Borg, MD  nitroGLYCERIN (NITROSTAT) 0.4 MG SL tablet Place 0.4 mg under the tongue every 5 (five) minutes as needed for chest pain.    [provider]  ondansetron (ZOFRAN) 4 MG tablet Take 1 tablet (4 mg total) by mouth every 8 (eight) hours as needed for nausea or vomiting. 12/20/20   Biagio Borg, MD  OZEMPIC, 1 MG/DOSE, 4 MG/3ML SOPN Inject 1 mg into the skin once a week. 11/02/21   Biagio Borg, MD  pantoprazole (PROTONIX) 40 MG tablet Take 1 tablet (40 mg total) by mouth daily. 07/03/21   Biagio Borg, MD  predniSONE (DELTASONE) 10 MG tablet 3 tabs by mouth per day for 3 days,2tabs per day for 3 days,1tab per day for 3 days 05/15/22   Biagio Borg, MD  sodium chloride (OCEAN) 0.65 % SOLN nasal spray Place 1 spray into both nostrils as needed for congestion. 12/19/20   Horton, Barbette Hair, MD  traMADol (ULTRAM) 50 MG tablet Take 1 tablet (50 mg total) by mouth every 6 (six) hours as needed. 06/26/18   Ashley Murrain, NP  triamcinolone (NASACORT) 55 MCG/ACT AERO nasal inhaler Place 2 sprays into the nose daily. Patient taking differently: Place 1-2 sprays into the nose daily as needed (NASAL CONGESTION). 01/04/17   Molpus, John, MD      Allergies    Nicotine and Ace inhibitors    Review of Systems   Review of Systems  Constitutional:  Negative for chills and fever.  HENT:  Positive for congestion and sore throat.   Respiratory:  Negative for cough and shortness of breath.   Cardiovascular:  Negative for chest pain.  Gastrointestinal:  Negative for abdominal pain.  All other systems reviewed and are  negative.   Physical Exam Updated Vital Signs BP (!) 152/103   Pulse 95   Temp 98.4 F (36.9 C) (Oral)   Resp 20   SpO2 99%  Physical Exam Vitals and nursing note reviewed.  Constitutional:      General: She is not in acute distress.    Appearance: She is not ill-appearing.  HENT:     Head: Normocephalic.  Eyes:     Pupils: Pupils are equal, round, and reactive to light.  Cardiovascular:     Rate and Rhythm: Normal rate and regular rhythm.     Pulses: Normal pulses.     Heart sounds: Normal heart sounds. No murmur heard.    No friction rub. No gallop.  Pulmonary:     Effort: Pulmonary effort is normal.     Breath sounds: Normal breath sounds.  Abdominal:     General: Abdomen is flat. There is no distension.     Palpations: Abdomen is soft.     Tenderness: There is no abdominal tenderness. There is no guarding or rebound.  Musculoskeletal:        General: Normal range of motion.     Cervical back: Neck supple.  Skin:    General: Skin is warm and dry.  Neurological:     General: No focal deficit present.     Mental Status: She is alert.  Psychiatric:        Mood and Affect: Mood normal.        Behavior: Behavior normal.     ED Results / Procedures / Treatments   Labs (all labs ordered are listed, but only abnormal results are displayed) Labs Reviewed  RESP PANEL BY RT-PCR (RSV, FLU A&B, COVID)  [id number] - Abnormal; Notable for the following components:      Result Value   SARS Coronavirus 2 by RT PCR POSITIVE (*)    All other components within normal limits    EKG None  Radiology No results found.  Procedures Procedures    Medications Ordered in ED Medications - No data to display  ED Course/ Medical Decision Making/ A&P                             Medical Decision Making Amount and/or Complexity of Data Reviewed Labs: ordered. Decision-making details documented in ED Course.  Risk Prescription drug management.   67 year old female presents  to the ED due to sore throat, headache, rhinorrhea, loss of taste that started last night.  Patient's mother tested positive for COVID-19 recently.  History of diabetes and hypertension.  Upon arrival, patient afebrile, not tachycardic or hypoxic.  Patient in no acute distress.  Benign physical exam.  Lungs clear to auscultation bilaterally.  Abdomen soft, nondistended, nontender.  COVID test positive.  Suspect symptoms related to COVID-19 infection.  Low suspicion for bacterial infection.  No meningismus to suggest meningitis.  Patient very well-appearing on exam.  Shared decision making in regards to prescription for Paxlovid which patient declined.  Discussed symptomatic treatment.  Patient discharged with cough medication for if she develops a cough. Quarantine guidelines discussed with patient. Strict ED precautions discussed with patient. Patient states understanding and agrees to plan. Patient discharged home in no acute distress and stable vitals  Has PCP        Final Clinical Impression(s) / ED Diagnoses Final diagnoses:  COVID-19 virus infection    Rx / DC Orders ED Discharge Orders          Ordered    benzonatate (TESSALON) 100 MG capsule  Every 8 hours        12/29/22 1603              Suzy Bouchard, PA-C 12/29/22 1607    Fredia Sorrow, MD 12/30/22 2314

## 2022-12-29 NOTE — ED Notes (Signed)
ED Provider at bedside for [facility]

## 2022-12-29 NOTE — Discharge Instructions (Signed)
It was a pleasure taking care of you today. Your COVID test was positive. I am sending you home with cough medication. Take as needed. Follow-up with PCP if symptoms do not improve over the next few days. Return to the ER for new or worsening symptoms.

## 2022-12-29 NOTE — ED Triage Notes (Signed)
Patient presents to ED via POV from home. Here after her mother tested positive for COVID. Patient is the primary caregiver. Endorses sore throat, headache, runny nose and loss of taste.

## 2022-12-29 NOTE — ED Notes (Signed)
Pt discharged to home. Discharge instructions have been discussed with patient and/or family members. Pt verbally acknowledges understanding d/c instructions, and endorses comprehension to checkout at registration before leaving.  °

## 2023-02-08 ENCOUNTER — Ambulatory Visit (INDEPENDENT_AMBULATORY_CARE_PROVIDER_SITE_OTHER): Payer: PPO | Admitting: Internal Medicine

## 2023-02-08 VITALS — BP 124/70 | HR 70 | Temp 98.1°F | Ht 63.0 in | Wt 235.0 lb

## 2023-02-08 DIAGNOSIS — E785 Hyperlipidemia, unspecified: Secondary | ICD-10-CM

## 2023-02-08 DIAGNOSIS — I10 Essential (primary) hypertension: Secondary | ICD-10-CM | POA: Diagnosis not present

## 2023-02-08 DIAGNOSIS — Z0001 Encounter for general adult medical examination with abnormal findings: Secondary | ICD-10-CM | POA: Diagnosis not present

## 2023-02-08 DIAGNOSIS — E538 Deficiency of other specified B group vitamins: Secondary | ICD-10-CM | POA: Diagnosis not present

## 2023-02-08 DIAGNOSIS — E559 Vitamin D deficiency, unspecified: Secondary | ICD-10-CM

## 2023-02-08 DIAGNOSIS — E119 Type 2 diabetes mellitus without complications: Secondary | ICD-10-CM | POA: Diagnosis not present

## 2023-02-08 DIAGNOSIS — Z0000 Encounter for general adult medical examination without abnormal findings: Secondary | ICD-10-CM

## 2023-02-08 DIAGNOSIS — Z01818 Encounter for other preprocedural examination: Secondary | ICD-10-CM | POA: Diagnosis not present

## 2023-02-08 LAB — VITAMIN D 25 HYDROXY (VIT D DEFICIENCY, FRACTURES): VITD: 27.83 ng/mL — ABNORMAL LOW (ref 30.00–100.00)

## 2023-02-08 LAB — BASIC METABOLIC PANEL
BUN: 19 mg/dL (ref 6–23)
CO2: 28 mEq/L (ref 19–32)
Calcium: 9.6 mg/dL (ref 8.4–10.5)
Chloride: 106 mEq/L (ref 96–112)
Creatinine, Ser: 0.79 mg/dL (ref 0.40–1.20)
GFR: 78.92 mL/min (ref >60.00)
Glucose, Bld: 106 mg/dL — ABNORMAL HIGH (ref 70–99)
Potassium: 4.3 mEq/L (ref 3.5–5.1)
Sodium: 141 mEq/L (ref 135–145)

## 2023-02-08 LAB — CBC WITH DIFFERENTIAL/PLATELET
Basophils Absolute: 0 10*3/uL (ref 0.0–0.1)
Basophils Relative: 0.5 % (ref 0.0–3.0)
Eosinophils Absolute: 0.2 10*3/uL (ref 0.0–0.7)
Eosinophils Relative: 2.4 % (ref 0.0–5.0)
HCT: 43.1 % (ref 36.0–46.0)
Hemoglobin: 14.1 g/dL (ref 12.0–15.0)
Lymphocytes Relative: 28.9 % (ref 12.0–46.0)
Lymphs Abs: 2 10*3/uL (ref 0.7–4.0)
MCHC: 32.7 g/dL (ref 30.0–36.0)
MCV: 80.8 fl (ref 78.0–100.0)
Monocytes Absolute: 0.6 10*3/uL (ref 0.1–1.0)
Monocytes Relative: 8.7 % (ref 3.0–12.0)
Neutro Abs: 4.2 10*3/uL (ref 1.4–7.7)
Neutrophils Relative %: 59.5 % (ref 43.0–77.0)
Platelets: 278 10*3/uL (ref 150.0–400.0)
RBC: 5.33 Mil/uL — ABNORMAL HIGH (ref 3.87–5.11)
RDW: 16.6 % — ABNORMAL HIGH (ref 11.5–15.5)
WBC: 7.1 10*3/uL (ref 4.0–10.5)

## 2023-02-08 LAB — URINALYSIS, ROUTINE W REFLEX MICROSCOPIC
Bilirubin Urine: NEGATIVE
Hgb urine dipstick: NEGATIVE
Ketones, ur: NEGATIVE
Leukocytes,Ua: NEGATIVE
Nitrite: NEGATIVE
Specific Gravity, Urine: 1.015 (ref 1.000–1.030)
Total Protein, Urine: NEGATIVE
Urine Glucose: >=1000 — AB
Urobilinogen, UA: 0.2 (ref 0.0–1.0)
pH: 6 (ref 5.0–8.0)

## 2023-02-08 LAB — HEPATIC FUNCTION PANEL
ALT: 13 U/L (ref 0–35)
AST: 15 U/L (ref 0–37)
Albumin: 3.8 g/dL (ref 3.5–5.2)
Alkaline Phosphatase: 111 U/L (ref 39–117)
Bilirubin, Direct: 0.1 mg/dL (ref 0.0–0.3)
Total Bilirubin: 0.4 mg/dL (ref 0.2–1.2)
Total Protein: 7.2 g/dL (ref 6.0–8.3)

## 2023-02-08 LAB — MICROALBUMIN / CREATININE URINE RATIO
Creatinine,U: 61.7 mg/dL
Microalb Creat Ratio: 3.9 mg/g (ref 0.0–30.0)
Microalb, Ur: 2.4 mg/dL — ABNORMAL HIGH (ref 0.0–1.9)

## 2023-02-08 LAB — LIPID PANEL
Cholesterol: 161 mg/dL (ref 0–200)
HDL: 59.9 mg/dL (ref >39.00)
LDL Cholesterol: 90 mg/dL (ref 0–99)
NonHDL: 101.47
Total CHOL/HDL Ratio: 3
Triglycerides: 58 mg/dL (ref 0.0–149.0)
VLDL: 11.6 mg/dL (ref 0.0–40.0)

## 2023-02-08 LAB — HEMOGLOBIN A1C: Hgb A1c MFr Bld: 6.8 % — ABNORMAL HIGH (ref 4.6–6.5)

## 2023-02-08 LAB — VITAMIN B12: Vitamin B-12: 529 pg/mL (ref 211–911)

## 2023-02-08 LAB — TSH: TSH: 0.71 u[IU]/mL (ref 0.35–5.50)

## 2023-02-08 NOTE — Patient Instructions (Addendum)
Please consider call your endo about increased mounjaro to 5 mg and possible change Jardiance to [place] if covered better with insurance  Please continue all other medications as before, and refills have been done if requested.  Please have the pharmacy call with any other refills you may need.  Please continue your efforts at being more active, low cholesterol diet, and weight control.  You are otherwise up to date with prevention measures today.  Please keep your appointments with your specialists as you may have planned - your breast exam soon, and endocrine  Please go to the LAB at the blood drawing area for the tests to be done  You will be contacted by phone if any changes need to be made immediately.  Otherwise, you will receive a letter about your results with an explanation, but please check with MyChart first.  Please remember to sign up for MyChart if you have not done so, as this will be important to you in the future with finding out test results, communicating by private email, and scheduling acute appointments online when needed.  Please make an Appointment to return for your 1 year visit, or sooner if needed  Good Luck with your [facility] in July !

## 2023-02-08 NOTE — Progress Notes (Unsigned)
Patient ID: TIVA POPKO, female   DOB: 02/07/1956, 67 y.o.   MRN: [id number]         Chief Complaint:: wellness exam and preop internal medicine, dm, htn, hld       HPI:  Theresa Gibson is a 67 y.o. female here for wellness exam; declines tdap, covid booster, for shingrix at pharmacy, for eye exam soon, o/w up to date                        Also Pt denies chest pain, increased sob or doe, wheezing, orthopnea, PND, increased LE swelling, palpitations, dizziness or syncope.   Pt denies polydipsia, polyuria, or new focal neuro s/s.    Pt denies fever, wt loss, night sweats, loss of appetite, or other constitutional symptoms  Now on several months with mounjaro 2.5 mg per endo, not much wt loss so far.  For left knee TKR soon.   Wt Readings from Last 3 Encounters:  02/08/23 235 lb (106.6 kg)  11/19/22 233 lb 0.4 oz (105.7 kg)  06/18/22 233 lb (105.7 kg)   BP Readings from Last 3 Encounters:  02/08/23 124/70  12/29/22 (!) 152/103  11/19/22 (!) 174/81   Immunization History  Administered Date(s) Administered   Hepatitis B 02/24/2007, 03/27/2007, 05/06/2007, 12/03/2007, 05/09/2008   Influenza Whole 12/03/2007, 08/20/2008   Influenza,inj,Quad PF,6+ Mos 09/07/2014, 11/19/2016, 09/01/2018, 09/07/2019   Influenza,inj,quad, With Preservative 09/11/2017   Influenza-Unspecified 09/18/2021   PFIZER(Purple Top)SARS-COV-2 Vaccination 12/20/2019, 01/20/2020, 09/16/2020, 09/18/2021   PPD Test 03/28/2022   Pneumococcal Conjugate-13 06/25/2019   Pneumococcal Polysaccharide-23 01/26/2021   Td 04/14/2008   Health Maintenance Due  Topic Date Due   Lung Cancer Screening  Never done   DTaP/Tdap/Td (2 - Tdap) 04/14/2018   OPHTHALMOLOGY EXAM  07/03/2019      Past Medical History:  Diagnosis Date   Acute MI, inferior wall ([place]) 08/2016   s/p DES x 2 to the RCA   Allergy    Arthritis    Depression    GERD (gastroesophageal reflux disease)    Hypertension    Meningioma (HCC)    PONV  (postoperative nausea and vomiting)    nausea only   Wears dentures    upper   Past Surgical History:  Procedure Laterality Date   ABDOMINAL HYSTERECTOMY     Total    BREAST EXCISIONAL BIOPSY Left    approx 30+ years ago   BREAST SURGERY     BX right breast   COLONOSCOPY W/ BIOPSIES AND POLYPECTOMY     CORONARY ANGIOPLASTY WITH STENT PLACEMENT     KNEE SURGERY Bilateral    arthroscopy   LAPAROSCOPIC APPENDECTOMY N/A 12/10/2015   Procedure: APPENDECTOMY LAPAROSCOPIC;  Surgeon: Erroll Luna, MD;  Location: [facility];  Service: General;  Laterality: N/A;   MULTIPLE TOOTH EXTRACTIONS     NASAL/SINUS ENDOSCOPY Bilateral 03/29/2017   ENDOSCOPIC SINUS SURGERY WITH NAVIGATION (Bilateral)   SINUS ENDO W/FUSION Bilateral 03/29/2017   Procedure: ENDOSCOPIC SINUS SURGERY WITH NAVIGATION;  Surgeon: Melissa Montane, MD;  Location[facility];  Service: ENT;  Laterality: Bilateral;   TUBAL LIGATION     TURBINATE REDUCTION Bilateral 03/29/2017   Procedure: TURBINATE REDUCTION;  Surgeon: Melissa Montane, MD;  Location: [facility];  Service: ENT;  Laterality: Bilateral;    reports that she quit smoking about 5 years ago. Her smoking use included cigarettes. She has a 21.00 pack-year smoking history. She has never used smokeless tobacco. She reports current  alcohol use. She reports that she does not use drugs. family history includes Arthritis in her mother; Heart attack in her father; Hypertension in her mother; Ovarian cancer in her maternal grandmother. Allergies  Allergen Reactions   Nicotine     Patch...caused a rash   Ace Inhibitors Cough    REACTION: Dry cough-lisinopril    Current Outpatient Medications on File Prior to Visit  Medication Sig Dispense Refill   acetaminophen (TYLENOL) 500 MG tablet Take 2 tablets (1,000 mg total) by mouth every 8 (eight) hours as needed for moderate pain. 30 tablet 0   albuterol (VENTOLIN HFA) 108 (90 Base) MCG/ACT inhaler INHALE 2 PUFFS BY MOUTH EVERY 4 TO 6 HOURS AS NEEDED FOR  SHORTNESS OF BREATH WHEEZING OR COUGHING 18 g 5   aspirin EC 81 MG tablet Take 81 mg by mouth daily.     atorvastatin (LIPITOR) 40 MG tablet Take 1 tablet (40 mg total) by mouth daily at 6 PM. 90 tablet 3   benzonatate (TESSALON) 100 MG capsule Take 1 capsule (100 mg total) by mouth every 8 (eight) hours. 21 capsule 0   Blood Glucose Monitoring Suppl (ACCU-CHEK GUIDE ME) w/Device KIT Use as directed four times daily E11.9 1 kit 0   buPROPion (WELLBUTRIN SR) 150 MG 12 hr tablet Take 1 tablet by mouth twice daily 60 tablet 9   carvedilol (COREG) 3.125 MG tablet Take 3.125 mg by mouth 2 (two) times daily with a meal.     cetirizine (ZYRTEC) 10 MG tablet Take 10 mg by mouth daily as needed for allergies.     cholecalciferol (VITAMIN D) 1000 units tablet Take 1,000 Units by mouth daily.     cyanocobalamin 1000 MCG tablet Take by mouth.     diclofenac Sodium (VOLTAREN) 1 % GEL Apply 4 g topically 4 (four) times daily. 100 g 0   famotidine (PEPCID) 20 MG tablet Take 1 tablet (20 mg total) by mouth 2 (two) times daily. 180 tablet 3   fluconazole (DIFLUCAN) 150 MG tablet 1 tab by mouth every 3 days as needed 2 tablet 1   fluticasone (FLONASE) 50 MCG/ACT nasal spray Place 2 sprays into both nostrils daily. 16 g 0   glucose blood (ONETOUCH VERIO) test strip USE 1 STRIP TO CHECK GLUCOSE TWICE DAILY AS DIRECTED 200 each 3   JARDIANCE 10 MG TABS tablet Take 1 tablet (10 mg total) by mouth daily. 90 tablet 3   ketoconazole (NIZORAL) 2 % cream APPLY  CREAM TOPICALLY TO VAGINAL AREA TWICE DAILY FOR 10 DAYS 45 g 1   Lancets MISC Use as directed once daily E11.9 Accu-Chek Guide 100 each 3   losartan (COZAAR) 50 MG tablet Take 1 tablet (50 mg total) by mouth daily. 90 tablet 3   losartan (COZAAR) 50 MG tablet Take 1 tablet by mouth daily.     meloxicam (MOBIC) 7.5 MG tablet Take 1 tablet by mouth twice daily as needed for pain 180 tablet 1   methylPREDNISolone (MEDROL DOSEPAK) 4 MG TBPK tablet Day 1: '8mg'$  before  breakfast, 4 mg after lunch, 4 mg after supper, and 8 mg at bedtime Day 2: 4 mg before breakfast, 4 mg after lunch, 4 mg  after supper, and 8 mg  at bedtime Day 3:  4 mg  before breakfast, 4 mg  after lunch, 4 mg after supper, and 4 mg  at bedtime Day 4: 4 mg  before breakfast, 4 mg  after lunch, and 4 mg at bedtime Day  5: 4 mg  before breakfast and 4 mg at bedtime Day 6: 4 mg  before breakfast 1 each 0   montelukast (SINGULAIR) 10 MG tablet Take 1 tablet (10 mg total) by mouth at bedtime. 90 tablet 3   MOUNJARO 2.5 MG/0.5ML Pen INJECT 0.5 ML(2.5 MG TOTAL) SUBCUTANEOUSLY ONCE A WEEK     naproxen (NAPROSYN) 500 MG tablet TAKE 1 TABLET BY MOUTH TWICE DAILY WITH MEALS as needed 60 tablet 3   nitroGLYCERIN (NITROSTAT) 0.4 MG SL tablet Place 0.4 mg under the tongue every 5 (five) minutes as needed for chest pain.     nystatin (MYCOSTATIN/NYSTOP) powder      ondansetron (ZOFRAN) 4 MG tablet Take 1 tablet (4 mg total) by mouth every 8 (eight) hours as needed for nausea or vomiting. 20 tablet 1   pantoprazole (PROTONIX) 40 MG tablet Take 1 tablet (40 mg total) by mouth daily. 90 tablet 1   predniSONE (DELTASONE) 10 MG tablet 3 tabs by mouth per day for 3 days,2tabs per day for 3 days,1tab per day for 3 days 18 tablet 0   sodium chloride (OCEAN) 0.65 % SOLN nasal spray Place 1 spray into both nostrils as needed for congestion. 88 mL 0   traMADol (ULTRAM) 50 MG tablet Take 1 tablet (50 mg total) by mouth every 6 (six) hours as needed. 15 tablet 0   triamcinolone (NASACORT) 55 MCG/ACT AERO nasal inhaler Place 2 sprays into the nose daily. (Patient taking differently: Place 1-2 sprays into the nose daily as needed (NASAL CONGESTION).)     No current facility-administered medications on file prior to visit.        ROS:  All others reviewed and negative.  Objective        PE:  BP 124/70   Pulse 70   Temp 98.1 F (36.7 C) (Oral)   Ht '5\' 3"'$  (1.6 m)   Wt 235 lb (106.6 kg)   SpO2 94%   BMI 41.63 kg/m                  Constitutional: Pt appears in NAD               HENT: Head: NCAT.                Right Ear: External ear normal.                 Left Ear: External ear normal.                Eyes: . Pupils are equal, round, and reactive to light. Conjunctivae and EOM are normal               Nose: without d/c or deformity               Neck: Neck supple. Gross normal ROM               Cardiovascular: Normal rate and regular rhythm.                 Pulmonary/Chest: Effort normal and breath sounds without rales or wheezing.                Abd:  Soft, NT, ND, + BS, no organomegaly               Neurological: Pt is alert. At baseline orientation, motor grossly intact               Skin: Skin is  warm. No rashes, no other new lesions, LE edema - none               Psychiatric: Pt behavior is normal without agitation   Micro: none  Cardiac tracings I have personally interpreted today:  none  Pertinent Radiological findings (summarize): none   Lab Results  Component Value Date   WBC 7.1 02/08/2023   HGB 14.1 02/08/2023   HCT 43.1 02/08/2023   PLT 278.0 02/08/2023   GLUCOSE 106 (H) 02/08/2023   CHOL 161 02/08/2023   TRIG 58.0 02/08/2023   HDL 59.90 02/08/2023   LDLCALC 90 02/08/2023   ALT 13 02/08/2023   AST 15 02/08/2023   NA 141 02/08/2023   K 4.3 02/08/2023   CL 106 02/08/2023   CREATININE 0.79 02/08/2023   BUN 19 02/08/2023   CO2 28 02/08/2023   TSH 0.71 02/08/2023   INR 1.08 03/29/2017   HGBA1C 6.8 (H) 02/08/2023   MICROALBUR 2.4 (H) 02/08/2023   Assessment/Plan:  Theresa Gibson is a 67 y.o. Black or African American [2] female with  has a past medical history of Acute MI, inferior wall ([place]) (08/2016), Allergy, Arthritis, Depression, GERD (gastroesophageal reflux disease), Hypertension, Meningioma ([place]), PONV (postoperative nausea and vomiting), and Wears dentures.  Encounter for well adult exam with abnormal findings Age and sex appropriate education and counseling updated  with regular exercise and diet Referrals for preventative services - none needed Immunizations addressed - decliens tdap, covid booster, but for shingrix at pharmacy Smoking counseling  - none needed Evidence for depression or other mood disorder - none significant Most recent labs reviewed. I have personally reviewed and have noted: 1) the patient's medical and social history 2) The patient's current medications and supplements 3) The patient's height, weight, and BMI have been recorded in the chart   Diabetes mellitus without complication (HCC) Lab Results  Component Value Date   HGBA1C 6.8 (H) 02/08/2023   Stable, pt to continue current medical treatment jardiance, 10 mg qd, mounjaro 2.5 gm weekly - pt to f//u with endo and consider increase to 5 mg   Essential hypertension BP Readings from Last 3 Encounters:  02/08/23 124/70  12/29/22 (!) 152/103  11/19/22 (!) 174/81   Stable, pt to continue medical treatment coreg 3.125 mg bid, losartan 50 mg qd   Hyperlipidemia with target LDL less than 70 Lab Results  Component Value Date   [place] 90 02/08/2023   Uncontrolled, goal ldl < 70, pt to continue current statin lipitor 40 mg qd and improved dm diet, declines increase statin for now   Preop exam for internal medicine Ok for surgury left knee, form to be signed  Vitamin D deficiency Last vitamin D Lab Results  Component Value Date   VD25OH 27.83 (L) 02/08/2023   Low, to start oral replacement  Followup: Return in about 1 year (around 02/08/2024).  Cathlean Cower, MD 02/10/2023 6:35 AM [facility]

## 2023-02-10 ENCOUNTER — Encounter: Payer: Self-pay | Admitting: Internal Medicine

## 2023-02-10 NOTE — Assessment & Plan Note (Signed)
Last vitamin D Lab Results  Component Value Date   VD25OH 27.83 (L) 02/08/2023   Low, to start oral replacement

## 2023-02-10 NOTE — Assessment & Plan Note (Signed)
Age and sex appropriate education and counseling updated with regular exercise and diet Referrals for preventative services - none needed Immunizations addressed - decliens tdap, covid booster, but for shingrix at pharmacy Smoking counseling  - none needed Evidence for depression or other mood disorder - none significant Most recent labs reviewed. I have personally reviewed and have noted: 1) the patient's medical and social history 2) The patient's current medications and supplements 3) The patient's height, weight, and BMI have been recorded in the chart

## 2023-02-10 NOTE — Assessment & Plan Note (Signed)
Lab Results  Component Value Date   [place] 90 02/08/2023   Uncontrolled, goal ldl < 70, pt to continue current statin lipitor 40 mg qd and improved dm diet, declines increase statin for now

## 2023-02-10 NOTE — Assessment & Plan Note (Signed)
Lab Results  Component Value Date   HGBA1C 6.8 (H) 02/08/2023   Stable, pt to continue current medical treatment jardiance, 10 mg qd, mounjaro 2.5 gm weekly - pt to f//u with endo and consider increase to 5 mg

## 2023-02-10 NOTE — Assessment & Plan Note (Signed)
BP Readings from Last 3 Encounters:  02/08/23 124/70  12/29/22 (!) 152/103  11/19/22 (!) 174/81   Stable, pt to continue medical treatment coreg 3.125 mg bid, losartan 50 mg qd

## 2023-02-10 NOTE — Assessment & Plan Note (Signed)
Ok for surgury left knee, form to be signed

## 2023-02-14 ENCOUNTER — Ambulatory Visit: Payer: PPO

## 2023-02-24 ENCOUNTER — Other Ambulatory Visit: Payer: Self-pay

## 2023-02-24 ENCOUNTER — Emergency Department (HOSPITAL_BASED_OUTPATIENT_CLINIC_OR_DEPARTMENT_OTHER)
Admission: EM | Admit: 2023-02-24 | Discharge: 2023-02-24 | Disposition: A | Discharge status: Home / Self Care | Payer: PPO | Attending: Emergency Medicine | Admitting: Emergency Medicine

## 2023-02-24 ENCOUNTER — Emergency Department (HOSPITAL_BASED_OUTPATIENT_CLINIC_OR_DEPARTMENT_OTHER): Payer: PPO

## 2023-02-24 ENCOUNTER — Encounter (HOSPITAL_BASED_OUTPATIENT_CLINIC_OR_DEPARTMENT_OTHER): Payer: Self-pay

## 2023-02-24 DIAGNOSIS — R519 Headache, unspecified: Secondary | ICD-10-CM

## 2023-02-24 DIAGNOSIS — I10 Essential (primary) hypertension: Secondary | ICD-10-CM | POA: Insufficient documentation

## 2023-02-24 LAB — CBC WITH DIFFERENTIAL/PLATELET
Abs Immature Granulocytes: 0.01 10*3/uL (ref 0.00–0.07)
Basophils Absolute: 0 10*3/uL (ref 0.0–0.1)
Basophils Relative: 1 %
Eosinophils Absolute: 0.2 10*3/uL (ref 0.0–0.5)
Eosinophils Relative: 3 %
HCT: 41.7 % (ref 36.0–46.0)
Hemoglobin: 13.6 g/dL (ref 12.0–15.0)
Immature Granulocytes: 0 %
Lymphocytes Relative: 29 %
Lymphs Abs: 1.8 10*3/uL (ref 0.7–4.0)
MCH: 26.4 pg (ref 26.0–34.0)
MCHC: 32.6 g/dL (ref 30.0–36.0)
MCV: 80.8 fL (ref 80.0–100.0)
Monocytes Absolute: 0.7 10*3/uL (ref 0.1–1.0)
Monocytes Relative: 11 %
Neutro Abs: 3.6 10*3/uL (ref 1.7–7.7)
Neutrophils Relative %: 56 %
Platelets: 252 10*3/uL (ref 150–400)
RBC: 5.16 MIL/uL — ABNORMAL HIGH (ref 3.87–5.11)
RDW: 16.6 % — ABNORMAL HIGH (ref 11.5–15.5)
WBC: 6.3 10*3/uL (ref 4.0–10.5)
nRBC: 0 % (ref 0.0–0.2)

## 2023-02-24 LAB — BASIC METABOLIC PANEL
Anion gap: 9 (ref 5–15)
BUN: 17 mg/dL (ref 8–23)
CO2: 24 mmol/L (ref 22–32)
Calcium: 9.1 mg/dL (ref 8.9–10.3)
Chloride: 104 mmol/L (ref 98–111)
Creatinine, Ser: 0.71 mg/dL (ref 0.44–1.00)
GFR, Estimated: >60 mL/min (ref >60)
Glucose, Bld: 102 mg/dL — ABNORMAL HIGH (ref 70–99)
Potassium: 3.7 mmol/L (ref 3.5–5.1)
Sodium: 137 mmol/L (ref 135–145)

## 2023-02-24 MED ORDER — ACETAMINOPHEN 500 MG PO TABS
1000.0000 mg | ORAL_TABLET | Freq: Once | ORAL | Status: AC
  Administered 2023-02-24: 1000 mg via ORAL
  Filled 2023-02-24: qty 2

## 2023-02-24 MED ORDER — IOHEXOL 350 MG/ML SOLN
75.0000 mL | Freq: Once | INTRAVENOUS | Status: AC | PRN
  Administered 2023-02-24: 75 mL via INTRAVENOUS

## 2023-02-24 MED ORDER — KETOROLAC TROMETHAMINE 15 MG/ML IJ SOLN
15.0000 mg | Freq: Once | INTRAMUSCULAR | Status: AC
  Administered 2023-02-24: 15 mg via INTRAVENOUS
  Filled 2023-02-24: qty 1

## 2023-02-24 NOTE — Discharge Instructions (Signed)

## 2023-02-24 NOTE — ED Provider Notes (Signed)
Emergency Department Provider Note   I have reviewed the triage vital signs and the nursing notes.   HISTORY  Chief Complaint Headache (Sharp pain in top front of head)   HPI Theresa Gibson is a 67 y.o. female with PMH of HTN, GERD, and known hemangioma presents to the emergency for evaluation of 5 days of headache.  Headache seems to be at the top of the head.  No sudden onset/maximal intensity headache symptoms.  No weakness, numbness, somnolence, nausea/vomiting.  No neck pain or stiffness.  She takes Tylenol which relieves symptoms to some degree but notes that she has a known history of meningioma, which concerned her, and prompted her ED visit today.  Past Medical History:  Diagnosis Date   Acute MI, inferior wall ([place]) 08/2016   s/p DES x 2 to the RCA   Allergy    Arthritis    Depression    GERD (gastroesophageal reflux disease)    Hypertension    Meningioma (HCC)    PONV (postoperative nausea and vomiting)    nausea only   Wears dentures    upper    Review of Systems  Constitutional: No fever/chills Cardiovascular: Denies chest pain. Respiratory: Denies shortness of breath. Gastrointestinal: No abdominal pain.  No nausea, no vomiting.  No diarrhea.  No constipation. Genitourinary: Negative for dysuria. Musculoskeletal: Negative for back pain. Skin: Negative for rash. Neurological: Negative for focal weakness or numbness. Positive HA.    ____________________________________________   PHYSICAL EXAM:  VITAL SIGNS: ED Triage Vitals  Enc Vitals Group     BP 02/24/23 0859 (!) 170/75     Pulse Rate 02/24/23 0859 67     Resp 02/24/23 0859 16     Temp 02/24/23 0859 (!) 97.5 F (36.4 C)     Temp Source 02/24/23 0859 Oral     SpO2 02/24/23 0859 100 %     Weight 02/24/23 0903 230 lb (104.3 kg)     Height 02/24/23 0903 5\' 3"  (1.6 m)   Constitutional: Alert and oriented. Well appearing and in no acute distress. Eyes: Conjunctivae are normal. PERRL.  Head:  Atraumatic. Nose: No congestion/rhinnorhea. Mouth/Throat: Mucous membranes are moist.   Neck: No stridor.   Cardiovascular: Normal rate, regular rhythm. Good peripheral circulation. Grossly normal heart sounds.   Respiratory: Normal respiratory effort.  No retractions. Lungs CTAB. Gastrointestinal: Soft and nontender. No distention.  Musculoskeletal: No lower extremity tenderness nor edema. No gross deformities of extremities. Neurologic:  Normal speech and language. No gross focal neurologic deficits are appreciated. 5/5 strength in the bilateral upper/lower extremities.  Skin:  Skin is warm, dry and intact. No rash noted.   ____________________________________________   LABS (all labs ordered are listed, but only abnormal results are displayed)  Labs Reviewed  BASIC METABOLIC PANEL - Abnormal; Notable for the following components:      Result Value   Glucose, Bld 102 (*)    All other components within normal limits  CBC WITH DIFFERENTIAL/PLATELET - Abnormal; Notable for the following components:   RBC 5.16 (*)    RDW 16.6 (*)    All other components within normal limits   ____________________________________________  RADIOLOGY  CT Angio Head W or Wo Contrast  Result Date: 02/24/2023 CLINICAL DATA:  67 year old female with frontal headache for 5 days. 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 EXAM: CT ANGIOGRAPHY HEAD TECHNIQUE: Multidetector CT imaging of the head was performed using the standard  protocol during bolus administration of intravenous contrast. Multiplanar CT image reconstructions and MIPs were obtained to evaluate the vascular anatomy. RADIATION DOSE REDUCTION: This exam was performed according to the departmental dose-optimization program which includes automated exposure control, adjustment of the mA and/or kV according to patient size and/or use of iterative reconstruction technique. CONTRAST:  40mL  OMNIPAQUE IOHEXOL 350 MG/ML SOLN COMPARISON:  Most recent brain MRI 03/10/2008. Prior head CTs 09/12/2016 and earlier. FINDINGS: CT HEAD Brain: Chronic coarse, flocculated calcification in an area of about 9 mm at the left lateral caudate. This has been present since 2008. Regional vague increased parenchymal density there corresponding to the popcorn type T2 hyperintense lesion with hemosiderin on MRI. No regional edema or significant mass effect. The entire area encompasses about 2 cm by CT (about 2.3 cm on 2009 MRI). No superimposed midline shift, ventriculomegaly, mass effect, acute intracranial hemorrhage or evidence of cortically based acute infarction. Outside of the left caudate region gray-white differentiation remains within normal limits. There is chronic partially empty sella. Calvarium and skull base: No acute osseous abnormality identified. Paranasal sinuses: Visualized paranasal sinuses and mastoids are stable and well aerated. Orbits: Visualized orbits and scalp soft tissues are within normal limits. CTA HEAD Posterior circulation: Patent and codominant distal vertebral arteries and vertebrobasilar junction with no plaque or stenosis. Normal PICA origins. Patent basilar artery, AICA, SCA and PCA origins. Posterior communicating arteries are diminutive or absent. Bilateral PCA branches are within normal limits. Anterior circulation: Both ICA siphons are patent. No significant ICA siphon plaque or stenosis. Patent carotid termini. Patent MCA and ACA origins. Dominant right and diminutive left ACA A1 segments. Normal anterior communicating artery. Bilateral ACA branches are within normal limits. Right MCA M1 segment and right MCA trifurcation are patent without stenosis. Right MCA branches are within normal limits. Left MCA M1 segment and trifurcation appear patent without plaque or stenosis. The M1 segment is located about 5 mm from the chronic left caudate vascular lesion. No CTA spot sign or abnormal  enhancement is associated with that lesion. The left MCA branches appear within normal limits. Venous sinuses: No convincing developmental venous anomaly at the level of the left caudate vascular lesion. Superior sagittal sinus is enhancing and patent. Early venous contrast timing elsewhere. Anatomic variants: Dominant right and diminutive left ACA A1 segments. Review of the MIP images confirms the above findings IMPRESSION: 1. A chronic left caudate region vascular lesion most compatible with a cerebral cavernous venous malformation has not significantly changed since 2017. No evidence of recent bleeding. No associated edema or mass effect. 2. Otherwise negative intracranial CTA. No significant atherosclerosis or stenosis. 3. No new intracranial abnormality by CT. Chronic partially empty sella. Electronically Signed   By: Genevie Ann M.D.   On: 02/24/2023 10:48    ____________________________________________   PROCEDURES  Procedure(s) performed:   Procedures  None ____________________________________________   INITIAL IMPRESSION / ASSESSMENT AND PLAN / ED COURSE  Pertinent labs & imaging results that were available during my care of the patient were reviewed by me and considered in my medical decision making (see chart for details).   This patient is Presenting for Evaluation of HA, which does require a range of treatment options, and is a complaint that involves a high risk of morbidity and mortality.  The Differential Diagnoses includes but is not exclusive to subarachnoid hemorrhage, meningitis, encephalitis, previous head trauma, cavernous venous thrombosis, muscle tension headache, glaucoma, temporal arteritis, migraine or migraine equivalent, etc.   Critical Interventions-  Medications  ketorolac (TORADOL) 15 MG/ML injection 15 mg (has no administration in time range)  acetaminophen (TYLENOL) tablet 1,000 mg (1,000 mg Oral Given 02/24/23 0926)  iohexol (OMNIPAQUE) 350 MG/ML injection  75 mL (75 mLs Intravenous Contrast Given 02/24/23 1004)    Reassessment after intervention:  HA symptoms improved.    Clinical Laboratory Tests Ordered, included basic metabolic panel with normal kidney function and electrolyte levels.  CBC without leukocytosis or anemia.  Radiologic Tests Ordered, included CTA head. I independently interpreted the images and agree with radiology interpretation.   Medical Decision Making: Summary:  Presents to the emergency department with headache over the past 5 days.  Lower suspicion for infectious etiology based on symptoms and exam.  Mild hypertension but missed her blood pressure medicine this morning.  No focal neurodeficits.  Known history of hemangioma.  Plan for CTA head and reassess.   Reevaluation with update and discussion with patient regarding the CT findings.  Venous malformation unchanged from 2017.  Plan for low-dose Toradol and neurology/PCP follow-up.  Contact information provided.  Patient plans to call for appointments tomorrow morning.    Patient's presentation is most consistent with acute presentation with potential threat to life or bodily function.   Disposition: discharge  ____________________________________________  FINAL CLINICAL IMPRESSION(S) / ED DIAGNOSES  Final diagnoses:  Bad headache     Note:  This document was prepared using Dragon voice recognition software and may include unintentional dictation errors.  Nanda Quinton, MD, [facility] Emergency Medicine    Annel Zunker, [facility], MD 02/24/23 [phone]

## 2023-02-24 NOTE — ED Notes (Signed)
Patient c/o worsening HA; EDP notified.

## 2023-02-24 NOTE — ED Notes (Signed)
Patient transported to CT 

## 2023-02-24 NOTE — ED Triage Notes (Signed)
Pt reports headache in front of head for the past 5 days. Pt states she has hx of mangioma and typically gets headaches for 1 day.

## 2023-03-01 DIAGNOSIS — M25662 Stiffness of left knee, not elsewhere classified: Secondary | ICD-10-CM | POA: Diagnosis not present

## 2023-03-01 DIAGNOSIS — M25562 Pain in left knee: Secondary | ICD-10-CM | POA: Diagnosis not present

## 2023-03-01 DIAGNOSIS — M1712 Unilateral primary osteoarthritis, left knee: Secondary | ICD-10-CM | POA: Diagnosis not present

## 2023-03-05 ENCOUNTER — Other Ambulatory Visit: Payer: Self-pay | Admitting: Internal Medicine

## 2023-03-07 ENCOUNTER — Telehealth: Payer: Self-pay | Admitting: Internal Medicine

## 2023-03-07 NOTE — Telephone Encounter (Signed)
Can you schedule her for nurse visit please

## 2023-03-07 NOTE — Telephone Encounter (Signed)
Patient would like a tb test done on Monday for a job.  She is coming in for a diabetic eye screen on that day.  Please call patient and let her know if this is possible.  Patient's number:  [phone]

## 2023-03-11 ENCOUNTER — Ambulatory Visit (INDEPENDENT_AMBULATORY_CARE_PROVIDER_SITE_OTHER): Payer: PPO

## 2023-03-11 DIAGNOSIS — A159 Respiratory tuberculosis unspecified: Secondary | ICD-10-CM

## 2023-03-11 LAB — HM DIABETES EYE EXAM

## 2023-03-11 NOTE — Progress Notes (Signed)
Tuberculin skin test applied to left ventral forearm. Explained to pt she will come into office on Wednesday to have it read.

## 2023-03-13 ENCOUNTER — Ambulatory Visit: Payer: PPO

## 2023-03-13 LAB — TB SKIN TEST
Induration: 0 mm
TB Skin Test: NEGATIVE

## 2023-03-13 NOTE — Progress Notes (Signed)
Tuberculin skin test applied to Left ventral forearm. Induration was 27mm with no erythema visible on forearm.

## 2023-03-19 DIAGNOSIS — Z7985 Long-term (current) use of injectable non-insulin antidiabetic drugs: Secondary | ICD-10-CM | POA: Diagnosis not present

## 2023-03-19 DIAGNOSIS — I252 Old myocardial infarction: Secondary | ICD-10-CM | POA: Diagnosis not present

## 2023-03-19 DIAGNOSIS — Z7984 Long term (current) use of oral hypoglycemic drugs: Secondary | ICD-10-CM | POA: Diagnosis not present

## 2023-03-19 DIAGNOSIS — E1140 Type 2 diabetes mellitus with diabetic neuropathy, unspecified: Secondary | ICD-10-CM | POA: Diagnosis not present

## 2023-03-19 DIAGNOSIS — E1165 Type 2 diabetes mellitus with hyperglycemia: Secondary | ICD-10-CM | POA: Diagnosis not present

## 2023-03-19 DIAGNOSIS — E1159 Type 2 diabetes mellitus with other circulatory complications: Secondary | ICD-10-CM | POA: Diagnosis not present

## 2023-03-20 ENCOUNTER — Encounter: Payer: Self-pay | Admitting: Internal Medicine

## 2023-03-20 NOTE — Telephone Encounter (Signed)
Good to know, but I would contact the provider involved for results , thanks

## 2023-03-21 ENCOUNTER — Encounter: Payer: Self-pay | Admitting: Internal Medicine

## 2023-03-21 NOTE — Telephone Encounter (Signed)
THN came here last Friday and done diabetic eye testing of the patients that need eye test. Her Study was normal. I will abstract results and send msg back to her stating her results was normal. Place fax of eye test in your incoming basket...Theresa Gibson

## 2023-03-27 ENCOUNTER — Telehealth: Payer: Self-pay | Admitting: Internal Medicine

## 2023-03-27 NOTE — Telephone Encounter (Signed)
We have received  Surgical Clearance PW in the fax, to be filled out by provider. Patient requested to send it via Fax within 7-days. Document is located in providers tray at front office.Please advise at Mobile [phone] (mobile)   Please fax to: [phone]

## 2023-03-27 NOTE — Telephone Encounter (Signed)
Form placed on providers desk 

## 2023-03-28 ENCOUNTER — Encounter: Payer: Self-pay | Admitting: Internal Medicine

## 2023-04-01 ENCOUNTER — Ambulatory Visit: Payer: PPO

## 2023-04-01 ENCOUNTER — Ambulatory Visit
Admission: RE | Admit: 2023-04-01 | Discharge: 2023-04-01 | Disposition: A | Discharge status: Home / Self Care | Payer: PPO | Source: Ambulatory Visit | Attending: Internal Medicine | Admitting: Internal Medicine

## 2023-04-01 ENCOUNTER — Other Ambulatory Visit: Payer: Self-pay | Admitting: Internal Medicine

## 2023-04-01 DIAGNOSIS — Z1231 Encounter for screening mammogram for malignant neoplasm of breast: Secondary | ICD-10-CM | POA: Diagnosis not present

## 2023-04-01 MED ORDER — OZEMPIC (0.25 OR 0.5 MG/DOSE) 2 MG/3ML ~~LOC~~ SOPN: PEN_INJECTOR | SUBCUTANEOUS | 11 refills | Status: DC

## 2023-04-07 ENCOUNTER — Other Ambulatory Visit: Payer: Self-pay | Admitting: Internal Medicine

## 2023-04-15 ENCOUNTER — Other Ambulatory Visit: Payer: Self-pay

## 2023-04-18 ENCOUNTER — Telehealth: Payer: Self-pay

## 2023-04-18 NOTE — Telephone Encounter (Signed)
PA Approved

## 2023-04-18 NOTE — Telephone Encounter (Signed)
PA initiated via Covermymeds; KEY: [id number]. Awaiting determination.

## 2023-04-24 NOTE — Telephone Encounter (Signed)
Patient was prescribed dexilant but there are no directions and it is not on her list of medications.  Please call:  [facility] Department  Phone:  [phone]

## 2023-04-30 ENCOUNTER — Telehealth: Payer: Self-pay | Admitting: Internal Medicine

## 2023-04-30 NOTE — Telephone Encounter (Signed)
The pharmacy called needing Directions for Dexilant.

## 2023-04-30 NOTE — Telephone Encounter (Signed)
I dont see dexilant on the med list

## 2023-05-06 ENCOUNTER — Encounter: Payer: Self-pay | Admitting: Family Medicine

## 2023-05-06 ENCOUNTER — Ambulatory Visit (INDEPENDENT_AMBULATORY_CARE_PROVIDER_SITE_OTHER): Payer: PPO | Admitting: Family Medicine

## 2023-05-06 VITALS — BP 138/78 | HR 78 | Temp 97.9°F | Resp 20 | Ht 63.0 in | Wt 233.0 lb

## 2023-05-06 DIAGNOSIS — I10 Essential (primary) hypertension: Secondary | ICD-10-CM | POA: Diagnosis not present

## 2023-05-06 DIAGNOSIS — E1165 Type 2 diabetes mellitus with hyperglycemia: Secondary | ICD-10-CM | POA: Diagnosis not present

## 2023-05-06 DIAGNOSIS — Z6835 Body mass index (BMI) 35.0-35.9, adult: Secondary | ICD-10-CM | POA: Diagnosis not present

## 2023-05-06 DIAGNOSIS — M170 Bilateral primary osteoarthritis of knee: Secondary | ICD-10-CM

## 2023-05-06 NOTE — Assessment & Plan Note (Signed)
Advised patient she may continue ibuprofen 600 mg, but to add Tylenol 500 mg to help further control her pain.

## 2023-05-06 NOTE — Patient Instructions (Addendum)
Move Losartan to the evening.  Continue checking blood pressure daily and keep a log. Log if you have headaches during this time.  Bring your machine with you next week.

## 2023-05-06 NOTE — Assessment & Plan Note (Signed)
Discussed with patient that her blood pressures are decent today at our office and the endocrinologist.  Due to her age and diabetes, her blood pressure would be better controlled <130/90.  Since she is waking up with a headache and elevated blood pressure I suggested she move a medication to the evening time.  She is going to move losartan with her evening medications.  She will continue to monitor her blood pressure once daily and keep a log to bring with her to her next appointment.  I also asked that she bring her blood pressure cuff so that we can compare it to a manual reading and ensure its accuracy.  We discussed the proper technique using a wrist cuff.  Education provided on the DASH diet.

## 2023-05-06 NOTE — Progress Notes (Signed)
Assessment & Plan:  Essential hypertension Assessment & Plan: Discussed with patient that her blood pressures are decent today at our office and the endocrinologist.  Due to her age and diabetes, her blood pressure would be better controlled <130/90.  Since she is waking up with a headache and elevated blood pressure I suggested she move a medication to the evening time.  She is going to move losartan with her evening medications.  She will continue to monitor her blood pressure once daily and keep a log to bring with her to her next appointment.  I also asked that she bring her blood pressure cuff so that we can compare it to a manual reading and ensure its accuracy.  We discussed the proper technique using a wrist cuff.  Education provided on the DASH diet.   Primary osteoarthritis of both knees Assessment & Plan: Advised patient she may continue ibuprofen 600 mg, but to add Tylenol 500 mg to help further control her pain.    Follow up plan: Return in about 1 week (around 05/13/2023) for HTN with me .  Deliah Boston, MSN, APRN, FNP-C  Subjective:  HPI: Theresa Gibson is a 67 y.o. female presenting on 05/06/2023 for Hypertension (Recent early AM headaches - home BP was 145/109 and 150/102) and left knee pain (Surg sch for 06/2023./[facility] )  Patient reports elevated blood pressure readings for the past week.  States she has been waking up with a headache at which time she takes her blood pressure and it is elevated.  At this time of day, she has not yet taken her blood pressure medications.  For the past day or 2 she has been trying to eat better and has not woken up with a headache, and her blood pressure has been good.  She is using a new wrist cuff.  Patient is also concerned about chronic knee pain.  She states some days it gets extremely painful.  At that time she takes ibuprofen 600 mg, but does not feel this is quite enough.  She does have a prescription for tramadol at home which she  does not like to take.  She currently does not have any Voltaren gel at home as she has not been to the store and is sometimes not able to afford it.    ROS: Negative unless specifically indicated above in HPI.   Relevant past medical history reviewed and updated as indicated.   Allergies and medications reviewed and updated.   Current Outpatient Medications:    acetaminophen (TYLENOL) 500 MG tablet, Take 2 tablets (1,000 mg total) by mouth every 8 (eight) hours as needed for moderate pain., Disp: 30 tablet, Rfl: 0   albuterol (VENTOLIN HFA) 108 (90 Base) MCG/ACT inhaler, INHALE 2 PUFFS BY MOUTH EVERY 4 TO 6 HOURS AS NEEDED FOR SHORTNESS OF BREATH WHEEZING OR COUGHING, Disp: 18 g, Rfl: 5   aspirin EC 81 MG tablet, Take 81 mg by mouth daily., Disp: , Rfl:    atorvastatin (LIPITOR) 40 MG tablet, Take 1 tablet (40 mg total) by mouth daily at 6 PM., Disp: 90 tablet, Rfl: 3   Blood Glucose Monitoring Suppl (ACCU-CHEK GUIDE ME) w/Device KIT, Use as directed four times daily E11.9, Disp: 1 kit, Rfl: 0   buPROPion (WELLBUTRIN SR) 150 MG 12 hr tablet, Take 1 tablet by mouth twice daily, Disp: 60 tablet, Rfl: 0   carvedilol (COREG) 3.125 MG tablet, Take 3.125 mg by mouth 2 (two) times daily with a meal.,  Disp: , Rfl:    cetirizine (ZYRTEC) 10 MG tablet, Take 10 mg by mouth daily as needed for allergies., Disp: , Rfl:    cholecalciferol (VITAMIN D) 1000 units tablet, Take 1,000 Units by mouth daily., Disp: , Rfl:    cyanocobalamin 1000 MCG tablet, Take by mouth., Disp: , Rfl:    diclofenac Sodium (VOLTAREN) 1 % GEL, Apply 4 g topically 4 (four) times daily., Disp: 100 g, Rfl: 0   famotidine (PEPCID) 20 MG tablet, Take 1 tablet (20 mg total) by mouth 2 (two) times daily., Disp: 180 tablet, Rfl: 3   fluticasone (FLONASE) 50 MCG/ACT nasal spray, Place 2 sprays into both nostrils daily., Disp: 16 g, Rfl: 0   glucose blood (ONETOUCH VERIO) test strip, USE 1 STRIP TO CHECK GLUCOSE TWICE DAILY AS DIRECTED,  Disp: 200 each, Rfl: 3   JARDIANCE 10 MG TABS tablet, Take 1 tablet (10 mg total) by mouth daily., Disp: 90 tablet, Rfl: 3   ketoconazole (NIZORAL) 2 % cream, APPLY  CREAM TOPICALLY TO VAGINAL AREA TWICE DAILY FOR 10 DAYS, Disp: 45 g, Rfl: 1   Lancets MISC, Use as directed once daily E11.9 Accu-Chek Guide, Disp: 100 each, Rfl: 3   montelukast (SINGULAIR) 10 MG tablet, Take 1 tablet (10 mg total) by mouth at bedtime., Disp: 90 tablet, Rfl: 3   naproxen (NAPROSYN) 500 MG tablet, TAKE 1 TABLET BY MOUTH TWICE DAILY WITH MEALS as needed, Disp: 60 tablet, Rfl: 3   nystatin (MYCOSTATIN/NYSTOP) powder, , Disp: , Rfl:    pantoprazole (PROTONIX) 40 MG tablet, Take 1 tablet (40 mg total) by mouth daily., Disp: 90 tablet, Rfl: 1   Semaglutide,0.25 or 0.5MG /DOS, (OZEMPIC, 0.25 OR 0.5 MG/DOSE,) 2 MG/3ML SOPN, Take 0.25 mg subq once weekly, Disp: 3 mL, Rfl: 11   sodium chloride (OCEAN) 0.65 % SOLN nasal spray, Place 1 spray into both nostrils as needed for congestion., Disp: 88 mL, Rfl: 0   traMADol (ULTRAM) 50 MG tablet, Take 1 tablet (50 mg total) by mouth every 6 (six) hours as needed., Disp: 15 tablet, Rfl: 0   triamcinolone (NASACORT) 55 MCG/ACT AERO nasal inhaler, Place 2 sprays into the nose daily. (Patient taking differently: Place 1-2 sprays into the nose daily as needed (NASAL CONGESTION).), Disp: , Rfl:    losartan (COZAAR) 50 MG tablet, Take 1 tablet (50 mg total) by mouth daily. (Patient not taking: Reported on 05/06/2023), Disp: 90 tablet, Rfl: 3   nitroGLYCERIN (NITROSTAT) 0.4 MG SL tablet, Place 0.4 mg under the tongue every 5 (five) minutes as needed for chest pain. (Patient not taking: Reported on 05/06/2023), Disp: , Rfl:   Allergies  Allergen Reactions   Nicotine     Patch...caused a rash   Ace Inhibitors Cough    REACTION: Dry cough-lisinopril     Objective:   BP 138/78   Pulse 78   Temp 97.9 F (36.6 C)   Resp 20   Ht 5\' 3"  (1.6 m)   Wt 233 lb (105.7 kg)   BMI 41.27 kg/m     Physical Exam Vitals reviewed.  Constitutional:      General: She is not in acute distress.    Appearance: Normal appearance. She is not ill-appearing, toxic-appearing or diaphoretic.  HENT:     Head: Normocephalic and atraumatic.  Eyes:     General: No scleral icterus.       Right eye: No discharge.        Left eye: No discharge.  Conjunctiva/sclera: Conjunctivae normal.  Cardiovascular:     Rate and Rhythm: Normal rate and regular rhythm.     Heart sounds: Normal heart sounds. No murmur heard.    No friction rub. No gallop.  Pulmonary:     Effort: Pulmonary effort is normal. No respiratory distress.     Breath sounds: Normal breath sounds. No stridor. No wheezing, rhonchi or rales.  Musculoskeletal:        General: Normal range of motion.     Cervical back: Normal range of motion.  Skin:    General: Skin is warm and dry.     Capillary Refill: Capillary refill takes less than 2 seconds.  Neurological:     General: No focal deficit present.     Mental Status: She is alert and oriented to person, place, and time. Mental status is at baseline.     Gait: Gait abnormal.  Psychiatric:        Mood and Affect: Mood normal.        Behavior: Behavior normal.        Thought Content: Thought content normal.        Judgment: Judgment normal.

## 2023-05-13 ENCOUNTER — Encounter: Payer: Self-pay | Admitting: Family Medicine

## 2023-05-13 ENCOUNTER — Ambulatory Visit (INDEPENDENT_AMBULATORY_CARE_PROVIDER_SITE_OTHER): Payer: PPO | Admitting: Family Medicine

## 2023-05-13 VITALS — BP 114/75 | HR 68 | Temp 97.9°F | Resp 20 | Ht 63.0 in | Wt 233.0 lb

## 2023-05-13 DIAGNOSIS — Z6841 Body Mass Index (BMI) 40.0 and over, adult: Secondary | ICD-10-CM

## 2023-05-13 DIAGNOSIS — I10 Essential (primary) hypertension: Secondary | ICD-10-CM | POA: Diagnosis not present

## 2023-05-13 MED ORDER — LOSARTAN POTASSIUM 100 MG PO TABS: 100.0000 mg | ORAL_TABLET | Freq: Every day | ORAL | 1 refills | Status: DC

## 2023-05-13 NOTE — Progress Notes (Signed)
Assessment & Plan:  Essential hypertension Assessment & Plan: Improving, but not yet at goal.  Increase losartan from 50 mg to 100 mg daily.  Encouraged her to keep it in the evening.  Education provided on the DASH diet.  Encouraged exercise.  Discussed weight loss will also help improve her blood pressure.  Orders: -     Losartan Potassium; Take 1 tablet (100 mg total) by mouth daily.  Dispense: 90 tablet; Refill: 1  Morbid obesity (HCC) Assessment & Plan: Discussed diet and exercise to help with weight loss.      Return in about 2 weeks (around 05/27/2023) for HTN with PCP, me if he is not available.  Deliah Boston, MSN, APRN, FNP-C  Subjective:      HPI: Theresa Gibson is a 67 y.o. female presenting on 05/13/2023 for Hypertension (1 week follow up ) . Hypertension: Patient is here to follow-up on her blood pressure.  She was seen last week due to elevated readings accompanied by headaches.  She was advised to move losartan 50 mg to the evening and keep a log of her blood pressure to bring with her today.  She has been monitoring her blood pressure as requested.  Readings first thing in the morning range 110-159/57-103 with 5/10 at goal of <130/90.  She states that she can feel when her blood pressure is rising as she starts to feel flushed and/or has a headache.  Denies excessive stress or pain when it is high.  She did bring her cuff with her today to compare to ours for accuracy.   New complaints: None  Social history:  Relevant past medical, surgical, family and social history reviewed and updated as indicated. Interim medical history since our last visit reviewed.  Allergies and medications reviewed and updated.  DATA REVIEWED: CHART IN EPIC  ROS: Negative unless specifically indicated above in HPI.    Current Outpatient Medications:    acetaminophen (TYLENOL) 500 MG tablet, Take 2 tablets (1,000 mg total) by mouth every 8 (eight) hours as needed for moderate  pain., Disp: 30 tablet, Rfl: 0   albuterol (VENTOLIN HFA) 108 (90 Base) MCG/ACT inhaler, INHALE 2 PUFFS BY MOUTH EVERY 4 TO 6 HOURS AS NEEDED FOR SHORTNESS OF BREATH WHEEZING OR COUGHING, Disp: 18 g, Rfl: 5   aspirin EC 81 MG tablet, Take 81 mg by mouth daily., Disp: , Rfl:    atorvastatin (LIPITOR) 40 MG tablet, Take 1 tablet (40 mg total) by mouth daily at 6 PM., Disp: 90 tablet, Rfl: 3   Blood Glucose Monitoring Suppl (ACCU-CHEK GUIDE ME) w/Device KIT, Use as directed four times daily E11.9, Disp: 1 kit, Rfl: 0   buPROPion (WELLBUTRIN SR) 150 MG 12 hr tablet, Take 1 tablet by mouth twice daily, Disp: 60 tablet, Rfl: 0   carvedilol (COREG) 3.125 MG tablet, Take 3.125 mg by mouth 2 (two) times daily with a meal., Disp: , Rfl:    cetirizine (ZYRTEC) 10 MG tablet, Take 10 mg by mouth daily as needed for allergies., Disp: , Rfl:    cholecalciferol (VITAMIN D) 1000 units tablet, Take 1,000 Units by mouth daily., Disp: , Rfl:    cyanocobalamin 1000 MCG tablet, Take by mouth., Disp: , Rfl:    diclofenac Sodium (VOLTAREN) 1 % GEL, Apply 4 g topically 4 (four) times daily., Disp: 100 g, Rfl: 0   famotidine (PEPCID) 20 MG tablet, Take 1 tablet (20 mg total) by mouth 2 (two) times daily., Disp: 180 tablet, Rfl:  3   fluticasone (FLONASE) 50 MCG/ACT nasal spray, Place 2 sprays into both nostrils daily., Disp: 16 g, Rfl: 0   glucose blood (ONETOUCH VERIO) test strip, USE 1 STRIP TO CHECK GLUCOSE TWICE DAILY AS DIRECTED, Disp: 200 each, Rfl: 3   ketoconazole (NIZORAL) 2 % cream, APPLY  CREAM TOPICALLY TO VAGINAL AREA TWICE DAILY FOR 10 DAYS, Disp: 45 g, Rfl: 1   Lancets MISC, Use as directed once daily E11.9 Accu-Chek Guide, Disp: 100 each, Rfl: 3   losartan (COZAAR) 50 MG tablet, Take 1 tablet (50 mg total) by mouth daily., Disp: 90 tablet, Rfl: 3   montelukast (SINGULAIR) 10 MG tablet, Take 1 tablet (10 mg total) by mouth at bedtime., Disp: 90 tablet, Rfl: 3   nitroGLYCERIN (NITROSTAT) 0.4 MG SL tablet, Place  0.4 mg under the tongue every 5 (five) minutes as needed for chest pain., Disp: , Rfl:    nystatin (MYCOSTATIN/NYSTOP) powder, , Disp: , Rfl:    pantoprazole (PROTONIX) 40 MG tablet, Take 1 tablet (40 mg total) by mouth daily., Disp: 90 tablet, Rfl: 1   Semaglutide,0.25 or 0.5MG /DOS, (OZEMPIC, 0.25 OR 0.5 MG/DOSE,) 2 MG/3ML SOPN, Take 0.25 mg subq once weekly, Disp: 3 mL, Rfl: 11   sodium chloride (OCEAN) 0.65 % SOLN nasal spray, Place 1 spray into both nostrils as needed for congestion., Disp: 88 mL, Rfl: 0   traMADol (ULTRAM) 50 MG tablet, Take 1 tablet (50 mg total) by mouth every 6 (six) hours as needed., Disp: 15 tablet, Rfl: 0   triamcinolone (NASACORT) 55 MCG/ACT AERO nasal inhaler, Place 2 sprays into the nose daily. (Patient taking differently: Place 1-2 sprays into the nose daily as needed (NASAL CONGESTION).), Disp: , Rfl:       Objective:    BP 114/75 Comment: patient Wrist machine - left  Pulse 68   Temp 97.9 F (36.6 C)   Resp 20   Ht 5\' 3"  (1.6 m)   Wt 233 lb (105.7 kg)   BMI 41.27 kg/m   Wt Readings from Last 3 Encounters:  05/13/23 233 lb (105.7 kg)  05/06/23 233 lb (105.7 kg)  02/24/23 230 lb (104.3 kg)    Physical Exam Vitals reviewed.  Constitutional:      General: She is not in acute distress.    Appearance: Normal appearance. She is morbidly obese. She is not ill-appearing, toxic-appearing or diaphoretic.  HENT:     Head: Normocephalic and atraumatic.  Eyes:     General: No scleral icterus.       Right eye: No discharge.        Left eye: No discharge.     Conjunctiva/sclera: Conjunctivae normal.  Cardiovascular:     Rate and Rhythm: Normal rate.  Pulmonary:     Effort: Pulmonary effort is normal. No respiratory distress.  Musculoskeletal:        General: Normal range of motion.     Cervical back: Normal range of motion.  Skin:    General: Skin is warm and dry.     Capillary Refill: Capillary refill takes less than 2 seconds.  Neurological:      General: No focal deficit present.     Mental Status: She is alert and oriented to person, place, and time. Mental status is at baseline.  Psychiatric:        Mood and Affect: Mood normal.        Behavior: Behavior normal.        Thought Content: Thought content normal.  Judgment: Judgment normal.

## 2023-05-13 NOTE — Assessment & Plan Note (Signed)
Discussed diet and exercise to help with weight loss.

## 2023-05-13 NOTE — Assessment & Plan Note (Signed)
Improving, but not yet at goal.  Increase losartan from 50 mg to 100 mg daily.  Encouraged her to keep it in the evening.  Education provided on the DASH diet.  Encouraged exercise.  Discussed weight loss will also help improve her blood pressure.

## 2023-05-16 ENCOUNTER — Other Ambulatory Visit: Payer: Self-pay | Admitting: Internal Medicine

## 2023-05-29 ENCOUNTER — Other Ambulatory Visit: Payer: Self-pay | Admitting: Internal Medicine

## 2023-05-29 ENCOUNTER — Other Ambulatory Visit: Payer: Self-pay

## 2023-05-30 ENCOUNTER — Ambulatory Visit: Payer: PPO | Admitting: Internal Medicine

## 2023-06-04 NOTE — Progress Notes (Addendum)
COVID Vaccine Completed:  Yes  Date of COVID positive in last 90 days:  PCP - Oliver Barre, MD Cardiologist - Cathlean Cower, MD Endocrinologist - Fredia Sorrow, FNP  Chest x-ray - 11-19-22 Epic EKG - 11-21-22 Epic Stress Test - 03-29-21 [facility] ECHO -  Cardiac Cath - 08-24-16 [facility] Pacemaker/ICD device last checked: Spinal Cord Stimulator:  Bowel Prep -   Sleep Study -  CPAP -   Fasting Blood Sugar -  Checks Blood Sugar _____ times a day  Semaglutide Last dose of GLP1 agonist-  do not take after 06-16-23 GLP1 instructions:  Hold 7 days before surgery   Last dose of SGLT-2 inhibitors-  N/A SGLT-2 instructions: N/A   Blood Thinner Instructions:  Time Aspirin Instructions: Last Dose:  Activity level:  Can go up a flight of stairs and perform activities of daily living without stopping and without symptoms of chest pain or shortness of breath.  Able to exercise without symptoms  Unable to go up a flight of stairs without symptoms of     Anesthesia review:  CAD, Hx of MI, HTN, DM  Patient denies shortness of breath, fever, cough and chest pain at PAT appointment  Patient verbalized understanding of instructions that were given to them at the PAT appointment. Patient was also instructed that they will need to review over the PAT instructions again at home before surgery.

## 2023-06-04 NOTE — Patient Instructions (Addendum)
SURGICAL WAITING ROOM VISITATION Patients having surgery or a procedure may have no more than 2 support people in the waiting area - these visitors may rotate.    Children under the age of 60 must have an adult with them who is not the patient.  If the patient needs to stay at the hospital during part of their recovery, the visitor guidelines for inpatient rooms apply. Pre-op nurse will coordinate an appropriate time for 1 support person to accompany patient in pre-op.  This support person may not rotate.    Please refer to the [facility] website for the visitor guidelines for Inpatients (after your surgery is over and you are in a regular room).       Your procedure is scheduled on: 06-24-23   Report to [facility] Main Entrance    Report to admitting at 5:50 AM   Call this number if you have problems the morning of surgery [phone]   Do not eat food :After Midnight.   After Midnight you may have the following liquids until 5:20 AM DAY OF SURGERY  Water Non-Citrus Juices (without pulp, NO RED-Apple, White grape, White cranberry) Black Coffee (NO MILK/CREAM OR CREAMERS, sugar ok)  Clear Tea (NO MILK/CREAM OR CREAMERS, sugar ok) regular and decaf                             Plain Jell-O (NO RED)                                           Fruit ices (not with fruit pulp, NO RED)                                     Popsicles (NO RED)                                                               Sports drinks like Gatorade (NO RED)                   The day of surgery:  Drink ONE (1) Pre-Surgery G2 at 5:20 AM the morning of surgery. Drink in one sitting. Do not sip.  This drink was given to you during your hospital  pre-op appointment visit. Nothing else to drink after completing the Pre-Surgery G2.          If you have questions, please contact your surgeon's office.   FOLLOW  ANY ADDITIONAL PRE OP INSTRUCTIONS YOU RECEIVED FROM YOUR SURGEON'S OFFICE!!!      Oral Hygiene is also important to reduce your risk of infection.                                    Remember - BRUSH YOUR TEETH THE MORNING OF SURGERY WITH YOUR REGULAR TOOTHPASTE   Do NOT smoke after Midnight   Take these medicines the morning of surgery with A SIP OF WATER:   Bupropion  Carvedilol  Zyrtec  Pantoprazole  Tylenol if needed  Okay to use inhalers and nasal spray  How to Manage Your Diabetes Before and After Surgery  Why is it important to control my blood sugar before and after surgery? Improving blood sugar levels before and after surgery helps healing and can limit problems. A way of improving blood sugar control is eating a healthy diet by:  Eating less sugar and carbohydrates  Increasing activity/exercise  Talking with your doctor about reaching your blood sugar goals High blood sugars (greater than 180 mg/dL) can raise your risk of infections and slow your recovery, so you will need to focus on controlling your diabetes during the weeks before surgery. Make sure that the doctor who takes care of your diabetes knows about your planned surgery including the date and location.  How do I manage my blood sugar before surgery? Check your blood sugar at least 4 times a day, starting 2 days before surgery, to make sure that the level is not too high or low. Check your blood sugar the morning of your surgery when you wake up and every 2 hours until you get to the Short Stay unit. If your blood sugar is less than 70 mg/dL, you will need to treat for low blood sugar: Do not take insulin. Treat a low blood sugar (less than 70 mg/dL) with  cup of clear juice (cranberry or apple), 4 glucose tablets, OR glucose gel. Recheck blood sugar in 15 minutes after treatment (to make sure it is greater than 70 mg/dL). If your blood sugar is not greater than 70 mg/dL on recheck, call [phone] for further instructions. Report your blood sugar to the short stay nurse when you get to  Short Stay.  If you are admitted to the hospital after surgery: Your blood sugar will be checked by the staff and you will probably be given insulin after surgery (instead of oral diabetes medicines) to make sure you have good blood sugar levels. The goal for blood sugar control after surgery is 80-180 mg/dL.   WHAT DO I DO ABOUT MY DIABETES MEDICATION?  Do not take oral diabetes medicines (pills) the morning of surgery.  Hold Semaglutide 7 days before surgery (do not take after 06-16-23)  DO NOT TAKE THE FOLLOWING 7 DAYS PRIOR TO SURGERY: Ozempic, Wegovy, Rybelsus (Semaglutide), Byetta (exenatide), Bydureon (exenatide ER), Victoza, Saxenda (liraglutide), or Trulicity (dulaglutide) Mounjaro (Tirzepatide) Adlyxin (Lixisenatide), Polyethylene Glycol Loxenatide.  Reviewed and Endorsed by [facility] Patient Education Committee, August 2015  Bring CPAP mask and tubing day of surgery.                              You may not have any metal on your body including hair pins, jewelry, and body piercing             Do not wear make-up, lotions, powders, perfumes or deodorant  Do not wear nail polish including gel and S&S, artificial/acrylic nails, or any other type of covering on natural nails including finger and toenails. If you have artificial nails, gel coating, etc. that needs to be removed by a nail salon please have this removed prior to surgery or surgery may need to be canceled/ delayed if the surgeon/ anesthesia feels like they are unable to be safely monitored.   Do not shave  48 hours prior to surgery.    Do not bring valuables to the hospital. [facility] IS NOT RESPONSIBLE   FOR VALUABLES.  Contacts, dentures or bridgework may not be worn into surgery.   Bring small overnight bag day of surgery.   DO NOT BRING YOUR HOME MEDICATIONS TO THE HOSPITAL. PHARMACY WILL DISPENSE MEDICATIONS LISTED ON YOUR MEDICATION LIST TO YOU DURING YOUR ADMISSION IN THE HOSPITAL!    Special  Instructions: Bring a copy of your healthcare power of attorney and living will documents the day of surgery if you haven't scanned them before.              Please read over the following fact sheets you were given: IF YOU HAVE QUESTIONS ABOUT YOUR PRE-OP INSTRUCTIONS PLEASE CALL [phone] [facility]  If you received a COVID test during your pre-op visit  it is requested that you wear a mask when out in public, stay away from anyone that may not be feeling well and notify your surgeon if you develop symptoms. If you test positive for Covid or have been in contact with anyone that has tested positive in the last 10 days please notify you surgeon.  [facility] - Preparing for Surgery   Pre-operative 5 CHG Bath Instructions   You can play a key role in reducing the risk of infection after surgery. Your skin needs to be as free of germs as possible. You can reduce the number of germs on your skin by washing with CHG (chlorhexidine gluconate) soap before surgery. CHG is an antiseptic soap that kills germs and continues to kill germs even after washing.   DO NOT use if you have an allergy to chlorhexidine/CHG or antibacterial soaps. If your skin becomes reddened or irritated, stop using the CHG and notify one of our RNs at  [phone] .   Please shower with the CHG soap starting 4 days before surgery using the following schedule:     Please keep in mind the following:  DO NOT shave, including legs and underarms, starting the day of your first shower.   You may shave your face at any point before/day of surgery.  Place clean sheets on your bed the day you start using CHG soap. Use a clean washcloth (not used since being washed) for each shower. DO NOT sleep with pets once you start using the CHG.   CHG Shower Instructions:  If you choose to wash your hair and private area, wash first with your normal shampoo/soap.  After you use shampoo/soap, rinse your hair and body thoroughly to remove  shampoo/soap residue.  Turn the water OFF and apply about 3 tablespoons (45 ml) of CHG soap to a CLEAN washcloth.  Apply CHG soap ONLY FROM YOUR NECK DOWN TO YOUR TOES (washing for 3-5 minutes)  DO NOT use CHG soap on face, private areas, open wounds, or sores.  Pay special attention to the area where your surgery is being performed.  If you are having back surgery, having someone wash your back for you may be helpful. Wait 2 minutes after CHG soap is applied, then you may rinse off the CHG soap.  Pat dry with a clean towel  Put on clean clothes/pajamas   If you choose to wear lotion, please use ONLY the CHG-compatible lotions on the back of this paper.     Additional instructions for the day of surgery: DO NOT APPLY any lotions, deodorants, cologne, or perfumes.   Put on clean/comfortable clothes.  Brush your teeth.  Ask your nurse before applying any prescription medications to the skin.      CHG Compatible Lotions  Aveeno Moisturizing lotion  Cetaphil Moisturizing Cream  Cetaphil Moisturizing Lotion  Clairol Herbal Essence Moisturizing Lotion, Dry Skin  Clairol Herbal Essence Moisturizing Lotion, Extra Dry Skin  Clairol Herbal Essence Moisturizing Lotion, Normal Skin  Curel Age Defying Therapeutic Moisturizing Lotion with Alpha Hydroxy  Curel Extreme Care Body Lotion  Curel Soothing Hands Moisturizing Hand Lotion  Curel Therapeutic Moisturizing Cream, Fragrance-Free  Curel Therapeutic Moisturizing Lotion, Fragrance-Free  Curel Therapeutic Moisturizing Lotion, Original Formula  Eucerin Daily Replenishing Lotion  Eucerin Dry Skin Therapy Plus Alpha Hydroxy Crme  Eucerin Dry Skin Therapy Plus Alpha Hydroxy Lotion  Eucerin Original Crme  Eucerin Original Lotion  Eucerin Plus Crme Eucerin Plus Lotion  Eucerin TriLipid Replenishing Lotion  Keri Anti-Bacterial Hand Lotion  Keri Deep Conditioning Original Lotion Dry Skin Formula Softly Scented  Keri Deep Conditioning  Original Lotion, Fragrance Free Sensitive Skin Formula  Keri Lotion Fast Absorbing Fragrance Free Sensitive Skin Formula  Keri Lotion Fast Absorbing Softly Scented Dry Skin Formula  Keri Original Lotion  Keri Skin Renewal Lotion Keri Silky Smooth Lotion  Keri Silky Smooth Sensitive Skin Lotion  Nivea Body Creamy Conditioning Oil  Nivea Body Extra Enriched Lotion  Nivea Body Original Lotion  Nivea Body Sheer Moisturizing Lotion Nivea Crme  Nivea Skin Firming Lotion  NutraDerm 30 Skin Lotion  NutraDerm Skin Lotion  NutraDerm Therapeutic Skin Cream  NutraDerm Therapeutic Skin Lotion  ProShield Protective Hand Cream  Provon moisturizing lotion   PATIENT SIGNATURE_________________________________  NURSE SIGNATURE__________________________________  ________________________________________________________________________    Rogelia Mire  An incentive spirometer is a tool that can help keep your lungs clear and active. This tool measures how well you are filling your lungs with each breath. Taking long deep breaths may help reverse or decrease the chance of developing breathing (pulmonary) problems (especially infection) following: A long period of time when you are unable to move or be active. BEFORE THE PROCEDURE  If the spirometer includes an indicator to show your best effort, your nurse or respiratory therapist will set it to a desired goal. If possible, sit up straight or lean slightly forward. Try not to slouch. Hold the incentive spirometer in an upright position. INSTRUCTIONS FOR USE  Sit on the edge of your bed if possible, or sit up as far as you can in bed or on a chair. Hold the incentive spirometer in an upright position. Breathe out normally. Place the mouthpiece in your mouth and seal your lips tightly around it. Breathe in slowly and as deeply as possible, raising the piston or the ball toward the top of the column. Hold your breath for 3-5 seconds or for as  long as possible. Allow the piston or ball to fall to the bottom of the column. Remove the mouthpiece from your mouth and breathe out normally. Rest for a few seconds and repeat Steps 1 through 7 at least 10 times every 1-2 hours when you are awake. Take your time and take a few normal breaths between deep breaths. The spirometer may include an indicator to show your best effort. Use the indicator as a goal to work toward during each repetition. After each set of 10 deep breaths, practice coughing to be sure your lungs are clear. If you have an incision (the cut made at the time of surgery), support your incision when coughing by placing a pillow or rolled up towels firmly against it. Once you are able to get out of bed, walk around indoors and cough well. You may stop using the  incentive spirometer when instructed by your caregiver.  RISKS AND COMPLICATIONS Take your time so you do not get dizzy or light-headed. If you are in pain, you may need to take or ask for pain medication before doing incentive spirometry. It is harder to take a deep breath if you are having pain. AFTER USE Rest and breathe slowly and easily. It can be helpful to keep track of a log of your progress. Your caregiver can provide you with a simple table to help with this. If you are using the spirometer at home, follow these instructions: SEEK MEDICAL CARE IF:  You are having difficultly using the spirometer. You have trouble using the spirometer as often as instructed. Your pain medication is not giving enough relief while using the spirometer. You develop fever of 100.5 F (38.1 C) or higher. SEEK IMMEDIATE MEDICAL CARE IF:  You cough up bloody sputum that had not been present before. You develop fever of 102 F (38.9 C) or greater. You develop worsening pain at or near the incision site. MAKE SURE YOU:  Understand these instructions. Will watch your condition. Will get help right away if you are not doing well or  get worse. Document Released: 04/01/2007 Document Revised: 02/11/2012 Document Reviewed: 06/02/2007 [facility] Patient Information 2014 [place], [place].   ________________________________________________________________________

## 2023-06-06 ENCOUNTER — Other Ambulatory Visit: Payer: Self-pay | Admitting: Internal Medicine

## 2023-06-06 MED ORDER — DEXLANSOPRAZOLE 30 MG PO CPDR: 30.0000 mg | DELAYED_RELEASE_CAPSULE | Freq: Every day | ORAL | 3 refills | Status: DC

## 2023-06-13 ENCOUNTER — Encounter (HOSPITAL_COMMUNITY)
Admission: RE | Admit: 2023-06-13 | Discharge: 2023-06-13 | Disposition: A | Discharge status: Home / Self Care | Payer: PPO | Source: Ambulatory Visit | Attending: Orthopedic Surgery | Admitting: Orthopedic Surgery

## 2023-06-13 ENCOUNTER — Encounter (HOSPITAL_COMMUNITY): Payer: Self-pay

## 2023-06-13 DIAGNOSIS — I10 Essential (primary) hypertension: Secondary | ICD-10-CM

## 2023-06-13 DIAGNOSIS — E119 Type 2 diabetes mellitus without complications: Secondary | ICD-10-CM

## 2023-06-13 DIAGNOSIS — Z01818 Encounter for other preprocedural examination: Secondary | ICD-10-CM

## 2023-06-24 ENCOUNTER — Ambulatory Visit: Admit: 2023-06-24 | Payer: PPO | Admitting: Orthopedic Surgery

## 2023-06-24 SURGERY — ARTHROPLASTY, KNEE, TOTAL
Anesthesia: Choice | Site: Knee | Laterality: Left

## 2023-07-12 ENCOUNTER — Other Ambulatory Visit: Payer: Self-pay | Admitting: Internal Medicine

## 2023-07-16 DIAGNOSIS — H35033 Hypertensive retinopathy, bilateral: Secondary | ICD-10-CM | POA: Diagnosis not present

## 2023-07-16 DIAGNOSIS — E119 Type 2 diabetes mellitus without complications: Secondary | ICD-10-CM | POA: Diagnosis not present

## 2023-07-16 DIAGNOSIS — H524 Presbyopia: Secondary | ICD-10-CM | POA: Diagnosis not present

## 2023-07-16 DIAGNOSIS — H35372 Puckering of macula, left eye: Secondary | ICD-10-CM | POA: Diagnosis not present

## 2023-07-16 DIAGNOSIS — H2513 Age-related nuclear cataract, bilateral: Secondary | ICD-10-CM | POA: Diagnosis not present

## 2023-07-29 ENCOUNTER — Ambulatory Visit: Payer: PPO

## 2023-08-02 ENCOUNTER — Ambulatory Visit (INDEPENDENT_AMBULATORY_CARE_PROVIDER_SITE_OTHER): Payer: PPO | Admitting: *Deleted

## 2023-08-02 VITALS — BP 135/70 | Wt 228.0 lb

## 2023-08-02 DIAGNOSIS — Z13820 Encounter for screening for osteoporosis: Secondary | ICD-10-CM | POA: Diagnosis not present

## 2023-08-02 DIAGNOSIS — Z0000 Encounter for general adult medical examination without abnormal findings: Secondary | ICD-10-CM

## 2023-08-02 DIAGNOSIS — Z122 Encounter for screening for malignant neoplasm of respiratory organs: Secondary | ICD-10-CM

## 2023-08-02 NOTE — Progress Notes (Signed)
Subjective:   Theresa Gibson is a 67 y.o. female who presents for Medicare Annual (Subsequent) preventive examination.  Visit Complete: Virtual  I connected with  Theresa Gibson on 08/02/23 by a audio enabled telemedicine application and verified that I am speaking with the correct person using two identifiers.  Patient Location: Home  Provider Location: Office/Clinic  I discussed the limitations of evaluation and management by telemedicine. The patient expressed understanding and agreed to proceed.  Patient Medicare AWV questionnaire was  not completed by the patient on Review of Systems    Defer to PCP  Cardiac Risk Factors include: advanced age (>62men, >56 women);obesity (BMI >30kg/m2);diabetes mellitus    Please see problem list for additional risk factors  Vital Signs: Vital signs are patient reported.  Objective:    Today's Vitals   08/02/23 1011  BP: 135/70  Weight: 228 lb (103.4 kg)  PainSc: 6    Body mass index is 40.39 kg/m.     08/02/2023   10:23 AM 02/24/2023    9:06 AM 12/29/2022    3:58 PM 11/19/2022    4:06 PM 06/18/2022   12:14 PM 08/05/2021   10:49 AM 03/09/2021   10:23 AM  Advanced Directives  Does Patient Have a Medical Advance Directive? No No No No No No No  Would patient like information on creating a medical advance directive? No - Patient declined    No - Patient declined No - Patient declined Yes (MAU/Ambulatory/Procedural Areas - Information given)    Current Medications (verified) Outpatient Encounter Medications as of 08/02/2023  Medication Sig   acetaminophen (TYLENOL) 650 MG CR tablet Take 650 mg by mouth every 8 (eight) hours as needed for pain.   albuterol (VENTOLIN HFA) 108 (90 Base) MCG/ACT inhaler INHALE 2 PUFFS BY MOUTH EVERY 4 TO 6 HOURS AS NEEDED FOR SHORTNESS OF BREATH WHEEZING OR COUGHING   aspirin EC 81 MG tablet Take 81 mg by mouth daily.   atorvastatin (LIPITOR) 40 MG tablet Take 1 tablet (40 mg total) by mouth daily at 6  PM.   Blood Glucose Monitoring Suppl (ACCU-CHEK GUIDE ME) w/Device KIT Use as directed four times daily E11.9   buPROPion (WELLBUTRIN SR) 150 MG 12 hr tablet Take 1 tablet by mouth twice daily   Calcium Carb-Cholecalciferol (CALCIUM 600 + D PO) Take 1 tablet by mouth daily.   carvedilol (COREG) 3.125 MG tablet Take 3.125 mg by mouth 2 (two) times daily with a meal.   cetirizine (ZYRTEC) 10 MG tablet Take 10 mg by mouth daily as needed for allergies.   Cholecalciferol (VITAMIN D3 MAXIMUM STRENGTH) 125 MCG (5000 UT) capsule Take 5,000 Units by mouth every Saturday.   diclofenac Sodium (VOLTAREN) 1 % GEL Apply 4 g topically 4 (four) times daily. (Patient taking differently: Apply 4 g topically 2 (two) times daily.)   glucose blood (ONETOUCH VERIO) test strip USE 1 STRIP TO CHECK GLUCOSE TWICE DAILY AS DIRECTED   ibuprofen (ADVIL) 200 MG tablet Take 600 mg by mouth every 6 (six) hours as needed for moderate pain.   Lancets MISC Use as directed once daily E11.9 Accu-Chek Guide   losartan (COZAAR) 100 MG tablet Take 1 tablet (100 mg total) by mouth daily.   montelukast (SINGULAIR) 10 MG tablet Take 1 tablet (10 mg total) by mouth at bedtime.   nitroGLYCERIN (NITROSTAT) 0.4 MG SL tablet Place 0.4 mg under the tongue every 5 (five) minutes as needed for chest pain.   nystatin (MYCOSTATIN/NYSTOP) powder  Apply 1 Application topically daily as needed (yeast).   pantoprazole (PROTONIX) 40 MG tablet Take 40 mg by mouth daily.   Semaglutide, 1 MG/DOSE, (OZEMPIC, 1 MG/DOSE,) 2 MG/1.5ML SOPN Inject 1 mg into the skin once a week.   Dexlansoprazole (DEXILANT) 30 MG capsule DR Take 1 capsule (30 mg total) by mouth daily.   No facility-administered encounter medications on file as of 08/02/2023.    Allergies (verified) Other, Ace inhibitors, and Nicotine   History: Past Medical History:  Diagnosis Date   Acute MI, inferior wall (HCC) 08/2016   s/p DES x 2 to the RCA   Allergy    Arthritis    Depression     GERD (gastroesophageal reflux disease)    Hypertension    Meningioma (HCC)    PONV (postoperative nausea and vomiting)    nausea only   Wears dentures    upper   Past Surgical History:  Procedure Laterality Date   ABDOMINAL HYSTERECTOMY     Total    BREAST EXCISIONAL BIOPSY Left    approx 30+ years ago   BREAST SURGERY     BX right breast   COLONOSCOPY W/ BIOPSIES AND POLYPECTOMY     CORONARY ANGIOPLASTY WITH STENT PLACEMENT     KNEE SURGERY Bilateral    arthroscopy   LAPAROSCOPIC APPENDECTOMY N/A 12/10/2015   Procedure: APPENDECTOMY LAPAROSCOPIC;  Surgeon: Harriette Bouillon, MD;  Location: [facility] OR;  Service: General;  Laterality: N/A;   MULTIPLE TOOTH EXTRACTIONS     NASAL/SINUS ENDOSCOPY Bilateral 03/29/2017   ENDOSCOPIC SINUS SURGERY WITH NAVIGATION (Bilateral)   SINUS ENDO W/FUSION Bilateral 03/29/2017   Procedure: ENDOSCOPIC SINUS SURGERY WITH NAVIGATION;  Surgeon: Suzanna Obey, MD;  Location: [facility] OR;  Service: ENT;  Laterality: Bilateral;   TUBAL LIGATION     TURBINATE REDUCTION Bilateral 03/29/2017   Procedure: TURBINATE REDUCTION;  Surgeon: Suzanna Obey, MD;  Location: [facility] OR;  Service: ENT;  Laterality: Bilateral;   Family History  Problem Relation Age of Onset   Hypertension Mother    Arthritis Mother    Heart attack Father        In his 32's   Ovarian cancer Maternal Grandmother    Colon cancer Neg Hx    Colon polyps Neg Hx    Rectal cancer Neg Hx    Stomach cancer Neg Hx    Breast cancer Neg Hx    Social History   Socioeconomic History   Marital status: Single    Spouse name: Not on file   Number of children: Not on file   Years of education: 14   Highest education level: Not on file  Occupational History   Occupation: Nurse    Employer: [employer]  Tobacco Use   Smoking status: Former    Current packs/day: 0.00    Average packs/day: 0.5 packs/day for 42.0 years (21.0 ttl pk-yrs)    Types: Cigarettes    Start date: 01/04/1976    Quit date: 01/03/2018     Years since quitting: 5.5   Smokeless tobacco: Never  Vaping Use   Vaping status: Never Used  Substance and Sexual Activity   Alcohol use: Not Currently    Comment: occ   Drug use: No   Sexual activity: Not on file  Other Topics Concern   Not on file  Social History Narrative   Born and raised in [place], [place]. 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 2 children (daughter 31) (daughter 22)   Plays with grandchildren  Recently started exercising with personal trainer   Started eating better   Social Determinants of Health   Financial Resource Strain: Medium Risk (08/02/2023)   Overall Financial Resource Strain (CARDIA)    Difficulty of Paying Living Expenses: Somewhat hard  Food Insecurity: No Food Insecurity (08/02/2023)   Hunger Vital Sign    Worried About Running Out of Food in the Last Year: Never true    Ran Out of Food in the Last Year: Never true  Transportation Needs: No Transportation Needs (08/02/2023)   PRAPARE - Administrator, Civil Service (Medical): No    Lack of Transportation (Non-Medical): No  Physical Activity: Insufficiently Active (08/02/2023)   Exercise Vital Sign    Days of Exercise per Week: 3 days    Minutes of Exercise per Session: 30 min  Stress: No Stress Concern Present (08/02/2023)   [employer] of Occupational Health - Occupational Stress Questionnaire    Feeling of Stress : Only a little  Social Connections: Moderately Isolated (08/02/2023)   Social Connection and Isolation Panel [NHANES]    Frequency of Communication with Friends and Family: Once a week    Frequency of Social Gatherings with Friends and Family: More than three times a week    Attends Religious Services: 1 to 4 times per year    Active Member of [employer] or Organizations: No    Attends Banker Meetings: Never    Marital Status: Widowed    Tobacco Counseling Counseling given: Not Answered   Clinical Intake:  Pre-visit  preparation completed: Yes  Pain : 0-10 Pain Score: 6  Pain Type: Chronic pain Pain Location: Knee Pain Orientation: Other (Comment) (Bilateral) Pain Descriptors / Indicators: Throbbing, Stabbing Pain Onset: More than a month ago Pain Frequency: Constant     Nutritional Status: BMI > 30  Obese Nutritional Risks: None Diabetes: Yes CBG done?: No Did pt. bring in CBG monitor from home?: No  How often do you need to have someone help you when you read instructions, pamphlets, or other written materials from your doctor or pharmacy?: 1 - Never What is the last grade level you completed in school?: 2 year college  Interpreter Needed?: No      Activities of Daily Living    08/02/2023   10:13 AM  In your present state of health, do you have any difficulty performing the following activities:  Hearing? 0  Vision? 0  Difficulty concentrating or making decisions? 0  Walking or climbing stairs? 1  Comment climbing stair due to her knee pain  Dressing or bathing? 0  Doing errands, shopping? 0  Preparing Food and eating ? N  Using the Toilet? N  In the past six months, have you accidently leaked urine? N  Do you have problems with loss of bowel control? N  Managing your Medications? N  Managing your Finances? N  Housekeeping or managing your Housekeeping? N    Patient Care Team: Corwin Levins, MD as PCP - General (Internal Medicine)  Indicate any recent Medical Services you may have received from other than [facility] providers in the past year (date may be approximate).     Assessment:   This is a routine wellness examination for Shirlie.  Hearing/Vision screen Vision Screening - Comments:: [facility]  Dietary issues and exercise activities discussed: Current Exercise Habits: Home exercise routine, Type of exercise: walking, Time (Minutes): 30, Frequency (Times/Week): 3, Weekly Exercise (Minutes/Week): 90, Intensity: Mild, Exercise limited by: orthopedic condition(s)  Goals Addressed   None   Depression Screen    08/02/2023   10:21 AM 05/13/2023    8:57 AM 05/06/2023    4:05 PM 03/28/2022   11:09 AM 01/26/2021   10:29 AM 01/26/2021   10:05 AM 01/08/2020   10:05 AM  PHQ 2/9 Scores  PHQ - 2 Score 0 1 1 2 1 1  0  PHQ- 9 Score    5       Fall Risk    08/02/2023   10:15 AM 05/06/2023    4:05 PM 02/08/2023   10:02 AM 03/28/2022   11:09 AM 01/26/2021   10:29 AM  Fall Risk   Falls in the past year? 0  0 0 0  Number falls in past yr: 0 0 0    Injury with Fall? 0 0 0    Risk for fall due to : No Fall Risks No Fall Risks;Impaired balance/gait No Fall Risks    Follow up Falls evaluation completed Falls evaluation completed Falls evaluation completed;Education provided      MEDICARE RISK AT HOME: Medicare Risk at Home Any stairs in or around the home?: No If so, are there any without handrails?: No Home free of loose throw rugs in walkways, pet beds, electrical cords, etc?: Yes Adequate lighting in your home to reduce risk of falls?: Yes Life alert?: Yes Use of a cane, walker or w/c?: Yes Grab bars in the bathroom?: No Shower chair or bench in shower?: No Elevated toilet seat or a handicapped toilet?: No  TIMED UP AND GO:  Was the test performed?  No    Cognitive Function:        08/02/2023   10:23 AM  6CIT Screen  What Year? 0 points  What month? 0 points  What time? 0 points  Count back from 20 0 points  Months in reverse 0 points  Repeat phrase 0 points  Total Score 0 points    Immunizations Immunization History  Administered Date(s) Administered   Hepatitis B 02/24/2007, 03/27/2007, 05/06/2007, 12/03/2007, 05/09/2008   Influenza Whole 12/03/2007, 08/20/2008   Influenza,inj,Quad PF,6+ Mos 09/07/2014, 11/19/2016, 09/01/2018, 09/07/2019   Influenza,inj,quad, With Preservative 09/11/2017   Influenza-Unspecified 09/18/2021   PFIZER(Purple Top)SARS-COV-2 Vaccination 12/20/2019, 01/20/2020, 09/16/2020, 09/18/2021   PPD Test 03/28/2022,  03/11/2023   Pneumococcal Conjugate-13 06/25/2019   Pneumococcal Polysaccharide-23 01/26/2021   Td 04/14/2008   Tdap 03/29/2022   Zoster Recombinant(Shingrix) 03/29/2022, 06/14/2022    TDAP status: Up to date  Flu Vaccine status: Due, Education has been provided regarding the importance of this vaccine. Advised may receive this vaccine at local pharmacy or Health Dept. Aware to provide a copy of the vaccination record if obtained from local pharmacy or Health Dept. Verbalized acceptance and understanding.  Pneumococcal vaccine status: Up to date  Covid-19 vaccine status: Completed vaccines  Qualifies for Shingles Vaccine? Yes   Zostavax completed Yes   Shingrix Completed?: Yes  Screening Tests Health Maintenance  Topic Date Due   Lung Cancer Screening  Never done   COVID-19 Vaccine (5 - 2023-24 season) 08/03/2022   DEXA SCAN  06/11/2023   HEMOGLOBIN A1C  08/11/2023   Diabetic kidney evaluation - Urine ACR  02/08/2024   FOOT EXAM  02/08/2024   Diabetic kidney evaluation - eGFR measurement  02/24/2024   OPHTHALMOLOGY EXAM  03/10/2024   Medicare Annual Wellness (AWV)  08/01/2024   MAMMOGRAM  03/31/2025   Colonoscopy  08/09/2025   Pneumonia Vaccine 51+ Years old (3 of 3 -  PPSV23 or PCV20) 01/26/2026   DTaP/Tdap/Td (3 - Td or Tdap) 03/29/2032   Hepatitis C Screening  Completed   HIV Screening  Completed   Zoster Vaccines- Shingrix  Completed   HPV VACCINES  Aged Out   INFLUENZA VACCINE  Discontinued   PAP SMEAR-Modifier  Discontinued    Health Maintenance  Health Maintenance Due  Topic Date Due   Lung Cancer Screening  Never done   COVID-19 Vaccine (5 - 2023-24 season) 08/03/2022   DEXA SCAN  06/11/2023    Colorectal cancer screening: Type of screening: Colonoscopy. Completed 08/10/2015. Repeat every 10 years  Mammogram status: Completed 04/01/2023. Repeat every year  Bone Density status: Ordered 08/02/2023. Pt provided with contact info and advised to call to  schedule appt.  Lung Cancer Screening: (Low Dose CT Chest recommended if Age 74-80 years, 20 pack-year currently smoking OR have quit w/in 15years.) does qualify.   Lung Cancer Screening Referral: referral placed  Additional Screening:  Hepatitis C Screening: does qualify; Completed 06/25/2019  Vision Screening: Recommended annual ophthalmology exams for early detection of glaucoma and other disorders of the eye. Is the patient up to date with their annual eye exam?  Yes  Who is the provider or what is the name of the office in which the patient attends annual eye exams? [facility] If pt is not established with a provider, would they like to be referred to a provider to establish care? No .   Dental Screening: Recommended annual dental exams for proper oral hygiene  Diabetic Foot Exam: Diabetic Foot Exam: Completed 02/08/2023  Community Resource Referral / Chronic Care Management: CRR required this visit?  No   CCM required this visit?  No     Plan:     I have personally reviewed and noted the following in the patient's chart:   Medical and social history Use of alcohol, tobacco or illicit drugs  Current medications and supplements including opioid prescriptions. Patient is not currently taking opioid prescriptions. Functional ability and status Nutritional status Physical activity Advanced directives List of other physicians Hospitalizations, surgeries, and ER visits in previous 12 months Vitals Screenings to include cognitive, depression, and falls Referrals and appointments  In addition, I have reviewed and discussed with patient certain preventive protocols, quality metrics, and best practice recommendations. A written personalized care plan for preventive services as well as general preventive health recommendations were provided to patient.     Tamela Oddi, CMA   08/02/2023  Non face to face 30 minutes  After Visit Summary: (MyChart) Due to this  being a telephonic visit, the after visit summary with patients personalized plan was offered to patient via MyChart   Nurse Notes:  Ms. Trace , Thank you for taking time to come for your Medicare Wellness Visit. I appreciate your ongoing commitment to your health goals. Please review the following plan we discussed and let me know if I can assist you in the future.   These are the goals we discussed:  Goals   None     This is a list of the screening recommended for you and due dates:  Health Maintenance  Topic Date Due   Screening for Lung Cancer  Never done   COVID-19 Vaccine (5 - 2023-24 season) 08/03/2022   DEXA scan (bone density measurement)  06/11/2023   Hemoglobin A1C  08/11/2023   Yearly kidney health urinalysis for diabetes  02/08/2024   Complete foot exam   02/08/2024   Yearly kidney function  blood test for diabetes  02/24/2024   Eye exam for diabetics  03/10/2024   Medicare Annual Wellness Visit  08/01/2024   Mammogram  03/31/2025   Colon Cancer Screening  08/09/2025   Pneumonia Vaccine (3 of 3 - PPSV23 or PCV20) 01/26/2026   DTaP/Tdap/Td vaccine (3 - Td or Tdap) 03/29/2032   Hepatitis C Screening  Completed   HIV Screening  Completed   Zoster (Shingles) Vaccine  Completed   HPV Vaccine  Aged Out   Flu Shot  Discontinued   Pap Smear  Discontinued

## 2023-08-02 NOTE — Patient Instructions (Signed)

## 2023-08-15 ENCOUNTER — Other Ambulatory Visit: Payer: Self-pay

## 2023-08-15 ENCOUNTER — Other Ambulatory Visit: Payer: Self-pay | Admitting: Internal Medicine

## 2023-08-19 ENCOUNTER — Other Ambulatory Visit: Payer: Self-pay

## 2023-08-19 ENCOUNTER — Encounter (HOSPITAL_BASED_OUTPATIENT_CLINIC_OR_DEPARTMENT_OTHER): Payer: Self-pay | Admitting: Emergency Medicine

## 2023-08-19 ENCOUNTER — Emergency Department (HOSPITAL_BASED_OUTPATIENT_CLINIC_OR_DEPARTMENT_OTHER)
Admission: EM | Admit: 2023-08-19 | Discharge: 2023-08-19 | Disposition: A | Discharge status: Home / Self Care | Payer: PPO | Attending: Emergency Medicine | Admitting: Emergency Medicine

## 2023-08-19 ENCOUNTER — Emergency Department (HOSPITAL_BASED_OUTPATIENT_CLINIC_OR_DEPARTMENT_OTHER): Payer: PPO

## 2023-08-19 DIAGNOSIS — W2203XA Walked into furniture, initial encounter: Secondary | ICD-10-CM | POA: Insufficient documentation

## 2023-08-19 DIAGNOSIS — I10 Essential (primary) hypertension: Secondary | ICD-10-CM | POA: Diagnosis not present

## 2023-08-19 DIAGNOSIS — M25562 Pain in left knee: Secondary | ICD-10-CM | POA: Diagnosis not present

## 2023-08-19 DIAGNOSIS — M1712 Unilateral primary osteoarthritis, left knee: Secondary | ICD-10-CM | POA: Diagnosis not present

## 2023-08-19 NOTE — Discharge Instructions (Signed)
Your knee X-ray shows knee arthritis. Please continue to follow with your orthopedic team and primary care doctor.

## 2023-08-19 NOTE — ED Triage Notes (Signed)
L knee pain that is chronic. Pt states she is waiting to get a joint replacement. Reports having injury to the knee 3 weeks ago. Ibuprofen and ice has given her some relief. She also states it swells more than it used to. Ambulatory to tx room with limp.

## 2023-08-19 NOTE — ED Provider Notes (Signed)
Emergency Department Provider Note   I have reviewed the triage vital signs and the nursing notes.   HISTORY  Chief Complaint Knee Pain   HPI Theresa Gibson is a 67 y.o. female with PMH reviewed presents to the ED with left knee pain. She has had worsening pain in the knee for the last 2 weeks after she was struck on the anterior left knee by a piece of furniture. No bleeding. Has felt a localized burning pain to the knee since that time. She is able to ambulate with a limp and continues to follow with orthopedics. She tells me that they have offered a knee replacement but will need to lose some weight before that can be done. No lower leg or ankle pain.    Past Medical History:  Diagnosis Date   Acute MI, inferior wall (HCC) 08/2016   s/p DES x 2 to the RCA   Allergy    Arthritis    Depression    GERD (gastroesophageal reflux disease)    Hypertension    Meningioma (HCC)    PONV (postoperative nausea and vomiting)    nausea only   Wears dentures    upper    Review of Systems  Constitutional: No fever/chills Cardiovascular: Denies chest pain. Respiratory: Denies shortness of breath. Gastrointestinal: No abdominal pain.  No nausea, no vomiting.  No diarrhea.  No constipation. Genitourinary: Negative for dysuria. Musculoskeletal: Positive left knee pain.  Skin: Negative for rash. Neurological: Negative for headaches.   ____________________________________________   PHYSICAL EXAM:  VITAL SIGNS: Vitals:   08/19/23 0345 08/19/23 0400  BP: 128/67 115/62  Pulse: 70 72  Resp:  15  Temp:    SpO2: 99% 99%     Constitutional: Alert and oriented. Well appearing and in no acute distress. Eyes: Conjunctivae are normal.  Head: Atraumatic. Nose: No congestion/rhinnorhea. Mouth/Throat: Mucous membranes are moist.   Neck: No stridor.   Cardiovascular: Good peripheral circulation specifically in the LLE.  Respiratory: Normal respiratory effort.  Gastrointestinal:  No distention.  Musculoskeletal: No large knee effusion, erythema, or warmth. Patient is ambulatory. No ankle tenderness. Soft compartments.  Neurologic:  Normal speech and language.  Skin:  Skin is warm, dry and intact. No rash noted.  ____________________________________________  RADIOLOGY  DG Knee Complete 4 Views Left  Result Date: 08/19/2023 CLINICAL DATA:  Left knee pain, chronic EXAM: LEFT KNEE - COMPLETE 4 VIEW COMPARISON:  02/03/2021 FINDINGS: Tricompartmental osteoarthritis with greatest spurring at the medial compartment where there is also joint space narrowing. Slight lateral translation of the tibial plateau. No acute fracture or dislocation. No joint effusion or erosion IMPRESSION: Tricompartmental osteoarthritis greatest at the medial compartment where there is joint space narrowing Electronically Signed   By: Tiburcio Pea M.D.   On: 08/19/2023 04:11    ____________________________________________   PROCEDURES  Procedure(s) performed:   Procedures  None ____________________________________________   INITIAL IMPRESSION / ASSESSMENT AND PLAN / ED COURSE  Pertinent labs & imaging results that were available during my care of the patient were reviewed by me and considered in my medical decision making (see chart for details).   This patient is Presenting for Evaluation of knee pain, which does require a range of treatment options, and is a complaint that involves a low risk of morbidity and mortality.  The Differential Diagnoses include OA, contusion, fracture, dislocation, compartment syndrome, etc.  Radiologic Tests Ordered, included knee XR. I independently interpreted the images and agree with radiology interpretation.   Medical  Decision Making: Summary:  Patient presents to the ED with knee pain. Low suspicion for fx but pain worse since injury. Will obtain plain films. Patient is already established with ortho.   Reevaluation with update and discussion with  patient. Arthritis on plain film. Plan for RICE and ortho follow up.    Patient's presentation is most consistent with exacerbation of chronic illness.   Disposition: discharge  ____________________________________________  FINAL CLINICAL IMPRESSION(S) / ED DIAGNOSES  Final diagnoses:  Acute pain of left knee    Note:  This document was prepared using Dragon voice recognition software and may include unintentional dictation errors.  Alona Bene, MD, [facility]    Sherre Wooton, Arlyss Repress, MD 08/19/23 [phone]

## 2023-09-09 ENCOUNTER — Other Ambulatory Visit: Payer: Self-pay | Admitting: Family Medicine

## 2023-09-09 DIAGNOSIS — I10 Essential (primary) hypertension: Secondary | ICD-10-CM

## 2023-09-11 DIAGNOSIS — E119 Type 2 diabetes mellitus without complications: Secondary | ICD-10-CM | POA: Diagnosis not present

## 2023-09-11 DIAGNOSIS — I10 Essential (primary) hypertension: Secondary | ICD-10-CM | POA: Diagnosis not present

## 2023-09-11 DIAGNOSIS — E782 Mixed hyperlipidemia: Secondary | ICD-10-CM | POA: Diagnosis not present

## 2023-09-11 DIAGNOSIS — I2510 Atherosclerotic heart disease of native coronary artery without angina pectoris: Secondary | ICD-10-CM | POA: Diagnosis not present

## 2023-09-12 DIAGNOSIS — I2510 Atherosclerotic heart disease of native coronary artery without angina pectoris: Secondary | ICD-10-CM | POA: Diagnosis not present

## 2023-09-19 ENCOUNTER — Ambulatory Visit: Payer: PPO | Admitting: Internal Medicine

## 2023-09-19 ENCOUNTER — Telehealth: Payer: Self-pay | Admitting: Internal Medicine

## 2023-09-19 ENCOUNTER — Encounter: Payer: Self-pay | Admitting: Internal Medicine

## 2023-09-19 VITALS — BP 128/72 | HR 65 | Temp 97.9°F | Ht 63.0 in | Wt 235.0 lb

## 2023-09-19 DIAGNOSIS — I10 Essential (primary) hypertension: Secondary | ICD-10-CM | POA: Diagnosis not present

## 2023-09-19 DIAGNOSIS — M25562 Pain in left knee: Secondary | ICD-10-CM | POA: Diagnosis not present

## 2023-09-19 DIAGNOSIS — E785 Hyperlipidemia, unspecified: Secondary | ICD-10-CM

## 2023-09-19 DIAGNOSIS — E559 Vitamin D deficiency, unspecified: Secondary | ICD-10-CM | POA: Diagnosis not present

## 2023-09-19 DIAGNOSIS — E119 Type 2 diabetes mellitus without complications: Secondary | ICD-10-CM | POA: Diagnosis not present

## 2023-09-19 MED ORDER — TRAMADOL HCL 50 MG PO TABS: 50.0000 mg | ORAL_TABLET | Freq: Four times a day (QID) | ORAL | 0 refills | Status: DC | PRN

## 2023-09-19 NOTE — Telephone Encounter (Signed)
This is what I suspected  Pt has had no significant wt loss with ozempic 2 mg; there is no higher dose  Please ask pt if she wants to change to the mounjaro 2.5 mg, then work her way up step by step monthly until the wt loss starts for her - just let me know, thanks

## 2023-09-19 NOTE — Progress Notes (Signed)
Patient ID: CAMERINA ACEY, female   DOB: [birth year], 67 y.o.   MRN: [id number]        Chief Complaint: follow up HTN, HLD and hyperglycemia , chronic left knee pain, low vit d       HPI:  Theresa Gibson is a 67 y.o. female here overall doing ok, hard to lose wt, needs wt down, looking for probable surgury spring 2025, pain is moderate to severe with standing at work worse.   , Pt denies chest pain, increased sob or doe, wheezing, orthopnea, PND, increased LE swelling, palpitations, dizziness or syncope.   Pt denies polydipsia, polyuria, or new focal neuro s/s.    Pt denies fever, night sweats, loss of appetite, or other constitutional symptoms  Pt may        Wt Readings from Last 3 Encounters:  09/19/23 235 lb (106.6 kg)  08/19/23 230 lb (104.3 kg)  08/02/23 228 lb (103.4 kg)   BP Readings from Last 3 Encounters:  09/19/23 128/72  08/19/23 115/62  08/02/23 135/70         Past Medical History:  Diagnosis Date   Acute MI, inferior wall (HCC) 08/2016   s/p DES x 2 to the RCA   Allergy    Arthritis    Depression    GERD (gastroesophageal reflux disease)    Hypertension    Meningioma (HCC)    PONV (postoperative nausea and vomiting)    nausea only   Wears dentures    upper   Past Surgical History:  Procedure Laterality Date   ABDOMINAL HYSTERECTOMY     Total    BREAST EXCISIONAL BIOPSY Left    approx 30+ years ago   BREAST SURGERY     BX right breast   COLONOSCOPY W/ BIOPSIES AND POLYPECTOMY     CORONARY ANGIOPLASTY WITH STENT PLACEMENT     KNEE SURGERY Bilateral    arthroscopy   LAPAROSCOPIC APPENDECTOMY N/A 12/10/2015   Procedure: APPENDECTOMY LAPAROSCOPIC;  Surgeon: Harriette Bouillon, MD;  Location: [facility] OR;  Service: General;  Laterality: N/A;   MULTIPLE TOOTH EXTRACTIONS     NASAL/SINUS ENDOSCOPY Bilateral 03/29/2017   ENDOSCOPIC SINUS SURGERY WITH NAVIGATION (Bilateral)   SINUS ENDO W/FUSION Bilateral 03/29/2017   Procedure: ENDOSCOPIC SINUS SURGERY WITH NAVIGATION;   Surgeon: Suzanna Obey, MD;  Location: [facility] OR;  Service: ENT;  Laterality: Bilateral;   TUBAL LIGATION     TURBINATE REDUCTION Bilateral 03/29/2017   Procedure: TURBINATE REDUCTION;  Surgeon: Suzanna Obey, MD;  Location: [facility] OR;  Service: ENT;  Laterality: Bilateral;    reports that she quit smoking about 5 years ago. Her smoking use included cigarettes. She started smoking about 47 years ago. She has a 21 pack-year smoking history. She has never used smokeless tobacco. She reports that she does not currently use alcohol. She reports that she does not use drugs. family history includes Arthritis in her mother; Heart attack in her father; Hypertension in her mother; Ovarian cancer in her maternal grandmother. Allergies  Allergen Reactions   Other Nausea And Vomiting    General anesthesia    Ace Inhibitors Cough    Dry cough-lisinopril    Nicotine Rash    Patch...caused a rash   Current Outpatient Medications on File Prior to Visit  Medication Sig Dispense Refill   acetaminophen (TYLENOL) 650 MG CR tablet Take 650 mg by mouth every 8 (eight) hours as needed for pain.     albuterol (VENTOLIN HFA) 108 (90 Base) MCG/ACT inhaler  INHALE 2 PUFFS BY MOUTH EVERY 4 TO 6 HOURS AS NEEDED FOR SHORTNESS OF BREATH WHEEZING OR COUGHING 18 g 5   aspirin EC 81 MG tablet Take 81 mg by mouth daily.     atorvastatin (LIPITOR) 40 MG tablet Take 1 tablet (40 mg total) by mouth daily at 6 PM. 90 tablet 3   Blood Glucose Monitoring Suppl (ACCU-CHEK GUIDE ME) w/Device KIT Use as directed four times daily E11.9 1 kit 0   buPROPion (WELLBUTRIN SR) 150 MG 12 hr tablet Take 1 tablet by mouth twice daily 180 tablet 3   Calcium Carb-Cholecalciferol (CALCIUM 600 + D PO) Take 1 tablet by mouth daily.     carvedilol (COREG) 3.125 MG tablet Take 3.125 mg by mouth 2 (two) times daily with a meal.     cetirizine (ZYRTEC) 10 MG tablet Take 10 mg by mouth daily as needed for allergies.     Cholecalciferol (VITAMIN D3 MAXIMUM STRENGTH)  125 MCG (5000 UT) capsule Take 5,000 Units by mouth every Saturday.     Dexlansoprazole (DEXILANT) 30 MG capsule DR Take 1 capsule (30 mg total) by mouth daily. 90 capsule 3   diclofenac Sodium (VOLTAREN) 1 % GEL Apply 4 g topically 4 (four) times daily. (Patient taking differently: Apply 4 g topically 2 (two) times daily.) 100 g 0   glucose blood (ONETOUCH VERIO) test strip USE 1 STRIP TO CHECK GLUCOSE TWICE DAILY AS DIRECTED 200 each 3   ibuprofen (ADVIL) 200 MG tablet Take 600 mg by mouth every 6 (six) hours as needed for moderate pain.     Lancets MISC Use as directed once daily E11.9 Accu-Chek Guide 100 each 3   losartan (COZAAR) 100 MG tablet Take 1 tablet by mouth once daily 90 tablet 1   montelukast (SINGULAIR) 10 MG tablet TAKE 1 TABLET BY MOUTH AT BEDTIME 90 tablet 3   nitroGLYCERIN (NITROSTAT) 0.4 MG SL tablet Place 0.4 mg under the tongue every 5 (five) minutes as needed for chest pain.     nystatin (MYCOSTATIN/NYSTOP) powder Apply 1 Application topically daily as needed (yeast).     pantoprazole (PROTONIX) 40 MG tablet Take 40 mg by mouth daily.     tirzepatide [facility]) 2.5 MG/0.5ML Pen Inject 2.5 mg into the skin once a week.     Semaglutide, 1 MG/DOSE, (OZEMPIC, 1 MG/DOSE,) 2 MG/1.5ML SOPN Inject 1 mg into the skin once a week. (Patient not taking: Reported on 09/19/2023)     No current facility-administered medications on file prior to visit.        ROS:  All others reviewed and negative.  Objective        PE:  BP 128/72 (BP Location: Right Arm, Patient Position: Sitting, Cuff Size: Normal)   Pulse 65   Temp 97.9 F (36.6 C) (Oral)   Ht 5\' 3"  (1.6 m)   Wt 235 lb (106.6 kg)   SpO2 99%   BMI 41.63 kg/m                 Constitutional: Pt appears in NAD               HENT: Head: NCAT.                Right Ear: External ear normal.                 Left Ear: External ear normal.  Eyes: . Pupils are equal, round, and reactive to light. Conjunctivae and EOM  are normal               Nose: without d/c or deformity               Neck: Neck supple. Gross normal ROM               Cardiovascular: Normal rate and regular rhythm.                 Pulmonary/Chest: Effort normal and breath sounds without rales or wheezing.                Abd:  Soft, NT, ND, + BS, no organomegaly               Neurological: Pt is alert. At baseline orientation, motor grossly intact               Skin: Skin is warm. No rashes, no other new lesions, LE edema - none               Psychiatric: Pt behavior is normal without agitation   Micro: none  Cardiac tracings I have personally interpreted today:  none  Pertinent Radiological findings (summarize): none   Lab Results  Component Value Date   WBC 6.3 02/24/2023   HGB 13.6 02/24/2023   HCT 41.7 02/24/2023   PLT 252 02/24/2023   GLUCOSE 102 (H) 02/24/2023   CHOL 161 02/08/2023   TRIG 58.0 02/08/2023   HDL 59.90 02/08/2023   LDLCALC 90 02/08/2023   ALT 13 02/08/2023   AST 15 02/08/2023   NA 137 02/24/2023   K 3.7 02/24/2023   CL 104 02/24/2023   CREATININE 0.71 02/24/2023   BUN 17 02/24/2023   CO2 24 02/24/2023   TSH 0.71 02/08/2023   INR 1.08 03/29/2017   HGBA1C 6.8 (H) 02/08/2023   MICROALBUR 2.4 (H) 02/08/2023   Assessment/Plan:  ASTARIA AUTREY is a 67 y.o. Black or African American [2] female with  has a past medical history of Acute MI, inferior wall (HCC) (08/2016), Allergy, Arthritis, Depression, GERD (gastroesophageal reflux disease), Hypertension, Meningioma (HCC), PONV (postoperative nausea and vomiting), and Wears dentures.  Pain in joint of left knee Chronic worsening now mod, hard to stand at work and function, for tramadol prn, hopefuly for surgury march 2025  Vitamin D deficiency Last vitamin D Lab Results  Component Value Date   VD25OH 27.83 (L) 02/08/2023   Low, to start oral replacement   Hyperlipidemia with target LDL less than 70 Lab Results  Component Value Date   LDLCALC 90  02/08/2023   Uncontrolled,goal ldl < 70, pt to continue current statin lipitor 40 every day,for lower chol diet, declines other change   Essential hypertension BP Readings from Last 3 Encounters:  09/19/23 128/72  08/19/23 115/62  08/02/23 135/70   Stable, pt to continue medical treatment coreg 3.125 bid, losartan 100 qd   Diabetes mellitus without complication (HCC) Lab Results  Component Value Date   HGBA1C 6.8 (H) 02/08/2023   Uncontrolled, pt to start mounjaro 2.5 mg weekly  Followup: Return if symptoms worsen or fail to improve.  Oliver Barre, MD 09/23/2023 8:25 PM [facility]

## 2023-09-19 NOTE — Telephone Encounter (Signed)
Patient called back and said that the medication is Ozempic - she is on 2 mg.and if Dr. Jonny Ruiz wants to increase just let her know.  [phone]

## 2023-09-19 NOTE — Patient Instructions (Signed)
Please take all new medication as prescribed - the tramadol as needed for pain (and call for refills if needed)  Please call to let us know if you want the Mounjaro 2.5 [facility]; as if you can lose wt in the next 6 months, perhaps you can then have the left knee surgury next spring  Please continue all other medications as before, and refills have been done if requested.  Please have the pharmacy call with any other refills you may need.  Please keep your appointments with your specialists as you may have planned

## 2023-09-20 NOTE — Telephone Encounter (Signed)
Called and left voice mail

## 2023-09-23 ENCOUNTER — Encounter: Payer: Self-pay | Admitting: Internal Medicine

## 2023-09-23 NOTE — Assessment & Plan Note (Signed)
Lab Results  Component Value Date   LDLCALC 90 02/08/2023   Uncontrolled,goal ldl < 70, pt to continue current statin lipitor 40 every day,for lower chol diet, declines other change

## 2023-09-23 NOTE — Assessment & Plan Note (Signed)
BP Readings from Last 3 Encounters:  09/19/23 128/72  08/19/23 115/62  08/02/23 135/70   Stable, pt to continue medical treatment coreg 3.125 bid, losartan 100 qd

## 2023-09-23 NOTE — Assessment & Plan Note (Signed)
Chronic worsening now mod, hard to stand at work and function, for tramadol prn, hopefuly for surgury march 2025

## 2023-09-23 NOTE — Assessment & Plan Note (Signed)
Lab Results  Component Value Date   HGBA1C 6.8 (H) 02/08/2023   Uncontrolled, pt to start mounjaro 2.5 mg weekly

## 2023-09-23 NOTE — Assessment & Plan Note (Signed)
Last vitamin D Lab Results  Component Value Date   VD25OH 27.83 (L) 02/08/2023   Low, to start oral replacement

## 2023-09-24 ENCOUNTER — Encounter: Payer: Self-pay | Admitting: Internal Medicine

## 2023-09-25 MED ORDER — TIRZEPATIDE 2.5 MG/0.5ML ~~LOC~~ SOAJ: 2.5000 mg | SUBCUTANEOUS | 11 refills | Status: DC

## 2023-09-25 MED ORDER — TRAMADOL HCL 50 MG PO TABS: 50.0000 mg | ORAL_TABLET | Freq: Four times a day (QID) | ORAL | 0 refills | Status: AC | PRN

## 2023-09-30 ENCOUNTER — Ambulatory Visit: Admit: 2023-09-30 | Payer: PPO | Admitting: Orthopedic Surgery

## 2023-09-30 SURGERY — ARTHROPLASTY, KNEE, TOTAL
Anesthesia: Choice | Site: Knee | Laterality: Left

## 2023-10-03 DIAGNOSIS — E1165 Type 2 diabetes mellitus with hyperglycemia: Secondary | ICD-10-CM | POA: Diagnosis not present

## 2023-10-09 ENCOUNTER — Encounter: Payer: Self-pay | Admitting: Internal Medicine

## 2023-10-20 ENCOUNTER — Other Ambulatory Visit: Payer: Self-pay | Admitting: Internal Medicine

## 2023-10-20 DIAGNOSIS — I10 Essential (primary) hypertension: Secondary | ICD-10-CM

## 2023-10-20 MED ORDER — PANTOPRAZOLE SODIUM 40 MG PO TBEC
40.0000 mg | DELAYED_RELEASE_TABLET | Freq: Every day | ORAL | 3 refills | Status: DC
Start: 2023-10-20 — End: 2023-11-20

## 2023-10-20 MED ORDER — CETIRIZINE HCL 10 MG PO TABS: 10.0000 mg | ORAL_TABLET | Freq: Every day | ORAL | 3 refills | Status: DC

## 2023-10-20 MED ORDER — BUPROPION HCL ER (SR) 150 MG PO TB12: 150.0000 mg | ORAL_TABLET | Freq: Two times a day (BID) | ORAL | 3 refills | Status: DC

## 2023-10-20 MED ORDER — NITROGLYCERIN 0.4 MG SL SUBL: 0.4000 mg | SUBLINGUAL_TABLET | SUBLINGUAL | 3 refills | Status: DC | PRN

## 2023-10-20 MED ORDER — ATORVASTATIN CALCIUM 40 MG PO TABS: 40.0000 mg | ORAL_TABLET | Freq: Every day | ORAL | 3 refills | Status: DC

## 2023-10-20 MED ORDER — LOSARTAN POTASSIUM 100 MG PO TABS: 100.0000 mg | ORAL_TABLET | Freq: Every day | ORAL | 3 refills | Status: DC

## 2023-10-23 ENCOUNTER — Ambulatory Visit: Payer: PPO | Admitting: Internal Medicine

## 2023-10-23 ENCOUNTER — Telehealth: Payer: Self-pay | Admitting: Internal Medicine

## 2023-10-23 NOTE — Telephone Encounter (Signed)
Prescription Request  10/23/2023  LOV: 09/19/2023  What is the name of the medication or equipment? Vitamin D 3 Tablet, Nystatin Powder, Calcium Plus D3 600 mg., Diclofenac sodium gel,   Have you contacted your pharmacy to request a refill? Yes   Which pharmacy would you like this sent to?    Patient notified that their request is being sent to the clinical staff for review and that they should receive a response within 2 business days.   Please advise at Mobile [phone] (mobile)

## 2023-10-23 NOTE — Telephone Encounter (Signed)
Pt can buy meds OTC.

## 2023-10-23 NOTE — Telephone Encounter (Signed)
All of these are otc - ok to obtain there

## 2023-11-18 ENCOUNTER — Encounter: Payer: Self-pay | Admitting: Internal Medicine

## 2023-11-18 DIAGNOSIS — I10 Essential (primary) hypertension: Secondary | ICD-10-CM

## 2023-11-18 DIAGNOSIS — E119 Type 2 diabetes mellitus without complications: Secondary | ICD-10-CM

## 2023-11-20 MED ORDER — ATORVASTATIN CALCIUM 40 MG PO TABS: 40.0000 mg | ORAL_TABLET | Freq: Every day | ORAL | 3 refills | Status: AC

## 2023-11-20 MED ORDER — ALBUTEROL SULFATE HFA 108 (90 BASE) MCG/ACT IN AERS: INHALATION_SPRAY | RESPIRATORY_TRACT | 5 refills | Status: AC

## 2023-11-20 MED ORDER — PANTOPRAZOLE SODIUM 40 MG PO TBEC
40.0000 mg | DELAYED_RELEASE_TABLET | Freq: Every day | ORAL | 3 refills | Status: AC
Start: 2023-11-20 — End: ?

## 2023-11-20 MED ORDER — ONETOUCH VERIO VI STRP: ORAL_STRIP | 3 refills | Status: AC

## 2023-11-20 MED ORDER — DEXLANSOPRAZOLE 30 MG PO CPDR: 30.0000 mg | DELAYED_RELEASE_CAPSULE | Freq: Every day | ORAL | 3 refills | Status: DC

## 2023-11-20 MED ORDER — CARVEDILOL 3.125 MG PO TABS
3.1250 mg | ORAL_TABLET | Freq: Two times a day (BID) | ORAL | 3 refills | Status: DC
Start: 2023-11-20 — End: 2024-10-20

## 2023-11-20 MED ORDER — NYSTATIN 100000 UNIT/GM EX POWD: CUTANEOUS | 1 refills | Status: DC

## 2023-11-20 MED ORDER — MONTELUKAST SODIUM 10 MG PO TABS: 10.0000 mg | ORAL_TABLET | Freq: Every day | ORAL | 3 refills | Status: AC

## 2023-11-20 MED ORDER — LOSARTAN POTASSIUM 100 MG PO TABS: 100.0000 mg | ORAL_TABLET | Freq: Every day | ORAL | 3 refills | Status: DC

## 2023-11-20 MED ORDER — LANCETS MISC
3 refills | Status: AC
Start: 2023-11-20 — End: ?

## 2023-11-20 MED ORDER — BUPROPION HCL ER (SR) 150 MG PO TB12: 150.0000 mg | ORAL_TABLET | Freq: Two times a day (BID) | ORAL | 3 refills | Status: AC

## 2023-11-20 MED ORDER — NITROGLYCERIN 0.4 MG SL SUBL: 0.4000 mg | SUBLINGUAL_TABLET | SUBLINGUAL | 3 refills | Status: AC | PRN

## 2023-11-20 MED ORDER — TIRZEPATIDE 2.5 MG/0.5ML ~~LOC~~ SOAJ
2.5000 mg | SUBCUTANEOUS | 3 refills | Status: AC
Start: 2023-11-20 — End: ?

## 2023-11-20 MED ORDER — CETIRIZINE HCL 10 MG PO TABS: 10.0000 mg | ORAL_TABLET | Freq: Every day | ORAL | 3 refills | Status: AC

## 2023-12-05 ENCOUNTER — Other Ambulatory Visit: Payer: PPO

## 2024-01-02 ENCOUNTER — Encounter: Payer: Self-pay | Admitting: Internal Medicine

## 2024-01-02 ENCOUNTER — Ambulatory Visit
Admission: RE | Admit: 2024-01-02 | Discharge: 2024-01-02 | Disposition: A | Discharge status: Home / Self Care | Payer: Medicare Other | Source: Ambulatory Visit | Attending: Internal Medicine | Admitting: Internal Medicine

## 2024-01-02 DIAGNOSIS — Z13820 Encounter for screening for osteoporosis: Secondary | ICD-10-CM

## 2024-02-04 ENCOUNTER — Other Ambulatory Visit: Payer: Self-pay | Admitting: Internal Medicine

## 2024-02-05 ENCOUNTER — Other Ambulatory Visit: Payer: Self-pay

## 2024-02-05 ENCOUNTER — Other Ambulatory Visit: Payer: Self-pay | Admitting: Internal Medicine

## 2024-02-10 ENCOUNTER — Encounter: Payer: Self-pay | Admitting: Internal Medicine

## 2024-02-26 ENCOUNTER — Telehealth: Payer: Self-pay | Admitting: Internal Medicine

## 2024-02-26 NOTE — Telephone Encounter (Signed)
 Contacted Theresa Gibson to schedule their annual wellness visit. Patient declined to schedule AWV at this time. Patient has transferred care. Didn't provide New PCP information, because she could not remember his name.   [facility] Care Guide [facility] AWV TEAM Direct Dial: [phone]

## 2024-05-29 ENCOUNTER — Other Ambulatory Visit: Payer: Self-pay

## 2024-05-29 ENCOUNTER — Encounter (HOSPITAL_BASED_OUTPATIENT_CLINIC_OR_DEPARTMENT_OTHER): Payer: Self-pay

## 2024-05-29 ENCOUNTER — Emergency Department (HOSPITAL_BASED_OUTPATIENT_CLINIC_OR_DEPARTMENT_OTHER): Admission: EM | Admit: 2024-05-29 | Discharge: 2024-05-29 | Disposition: A | Discharge status: Home / Self Care

## 2024-05-29 DIAGNOSIS — M7661 Achilles tendinitis, right leg: Secondary | ICD-10-CM | POA: Diagnosis present

## 2024-05-29 DIAGNOSIS — Z79899 Other long term (current) drug therapy: Secondary | ICD-10-CM | POA: Diagnosis not present

## 2024-05-29 DIAGNOSIS — Z7982 Long term (current) use of aspirin: Secondary | ICD-10-CM | POA: Insufficient documentation

## 2024-05-29 DIAGNOSIS — Z794 Long term (current) use of insulin: Secondary | ICD-10-CM | POA: Diagnosis not present

## 2024-05-29 DIAGNOSIS — I10 Essential (primary) hypertension: Secondary | ICD-10-CM | POA: Insufficient documentation

## 2024-05-29 NOTE — Discharge Instructions (Addendum)
 You are given a walking boot in the emergency department.  This will allow you to rest the Achilles tendon to hopefully help resolve the pain.  You were seen by a foot doctor within the past 3 years have listed their information above for you to follow-up.  He can also ice the area 4 times a day while you are awake for 10 to 15 minutes each time.  You can take 600 mg of ibuprofen  3 times a day for no more than 5 days in a row.  Take Tylenol  1000 mg every 6 hours as you need to for pain control.  Return for any concerning symptoms.

## 2024-05-29 NOTE — ED Triage Notes (Signed)
 Pt reports right foot pain x 1 week. Pain right heel/ankle and radiates to foot. Denies injury

## 2024-05-29 NOTE — ED Provider Notes (Signed)
 [place] EMERGENCY DEPARTMENT AT [facility] [place] Provider Note   CSN: [id number] Arrival date & time: 05/29/24  1407     Patient presents with: Foot Pain   Theresa Gibson is a 68 y.o. female.   68 year old female presents today for concern of right heel pain that started about 1 week ago.  No preceding injury.  She is on her feet a lot.  She states that she has history of plantar fasciitis so she did supportive care for that including a splint that she wore at night.  This did not help.  Pain gets worse the more she walks.  Denies any fever, difficulty bearing weight.  The history is provided by the patient. No language interpreter was used.       Prior to Admission medications   Medication Sig Start Date End Date Taking? Authorizing Provider  acetaminophen  (TYLENOL ) 650 MG CR tablet Take 650 mg by mouth every 8 (eight) hours as needed for pain.    [provider]  albuterol  (VENTOLIN  HFA) 108 (90 Base) MCG/ACT inhaler INHALE 2 PUFFS BY MOUTH EVERY 4 TO 6 HOURS AS NEEDED FOR SHORTNESS OF BREATH WHEEZING OR COUGHING 11/20/23   Norleen Lynwood ORN, MD  aspirin  EC 81 MG tablet Take 81 mg by mouth daily.    [provider]  atorvastatin  (LIPITOR) 40 MG tablet Take 1 tablet (40 mg total) by mouth daily at 6 PM. 11/20/23   Norleen Lynwood ORN, MD  Blood Glucose Monitoring Suppl (ACCU-CHEK GUIDE ME) w/Device KIT Use as directed four times daily E11.9 10/04/20   Norleen Lynwood ORN, MD  buPROPion  (WELLBUTRIN  SR) 150 MG 12 hr tablet Take 1 tablet (150 mg total) by mouth 2 (two) times daily. 11/20/23   Norleen Lynwood ORN, MD  Calcium  Carb-Cholecalciferol  (CALCIUM  600 + D PO) Take 1 tablet by mouth daily.    [provider]  carvedilol  (COREG ) 3.125 MG tablet Take 1 tablet (3.125 mg total) by mouth 2 (two) times daily with a meal. 11/20/23   Norleen Lynwood ORN, MD  cetirizine  (ZYRTEC ) 10 MG tablet Take 1 tablet (10 mg total) by mouth daily. 11/20/23 11/19/24  Norleen Lynwood ORN, MD   Cholecalciferol  (VITAMIN D3 MAXIMUM STRENGTH) 125 MCG (5000 UT) capsule Take 5,000 Units by mouth every Saturday.    [provider]  Dexlansoprazole  (DEXILANT ) 30 MG capsule DR Take 1 capsule (30 mg total) by mouth daily. 11/20/23   Norleen Lynwood ORN, MD  diclofenac  Sodium (VOLTAREN ) 1 % GEL Apply 4 g topically 4 (four) times daily. Patient taking differently: Apply 4 g topically 2 (two) times daily. 06/18/22   Emil Share, DO  glucose blood (ONETOUCH VERIO) test strip USE 1 STRIP TO CHECK GLUCOSE TWICE DAILY AS DIRECTED E11.9 11/20/23   Norleen Lynwood ORN, MD  ibuprofen  (ADVIL ) 200 MG tablet Take 600 mg by mouth every 6 (six) hours as needed for moderate pain.    [provider]  Lancets MISC Use as directed once daily E11.9 Accu-Chek Guide 11/20/23   Norleen Lynwood ORN, MD  losartan  (COZAAR ) 100 MG tablet Take 1 tablet (100 mg total) by mouth daily. 11/20/23   Norleen Lynwood ORN, MD  montelukast  (SINGULAIR ) 10 MG tablet Take 1 tablet (10 mg total) by mouth at bedtime. 11/20/23   Norleen Lynwood ORN, MD  nitroGLYCERIN  (NITROSTAT ) 0.4 MG SL tablet Place 1 tablet (0.4 mg total) under the tongue every 5 (five) minutes as needed for chest pain. 11/20/23   Norleen Lynwood ORN,  MD  nystatin  (NYAMYC ) powder APPLY TO AFFECTED AREA(S) ONCE DAILY AS NEEDED AS DIRECTED 02/05/24   Norleen Lynwood ORN, MD  pantoprazole  (PROTONIX ) 40 MG tablet Take 1 tablet (40 mg total) by mouth daily. 11/20/23   Norleen Lynwood ORN, MD  tirzepatide  (MOUNJARO ) 2.5 MG/0.5ML Pen Inject 2.5 mg into the skin once a week. E11.9 11/20/23   Norleen Lynwood ORN, MD  traMADol  (ULTRAM ) 50 MG tablet Take 1 tablet (50 mg total) by mouth every 6 (six) hours as needed for moderate pain (pain score 4-6). 09/25/23   Norleen Lynwood ORN, MD    Allergies: Other, Ace inhibitors, and Nicotine    Review of Systems  Constitutional:  Negative for fever.  Musculoskeletal:  Positive for arthralgias. Negative for joint swelling.  All other systems reviewed and are negative.   Updated  Vital Signs BP (!) 151/90 (BP Location: Left Arm)   Pulse 74   Temp 97.7 F (36.5 C) (Oral)   Resp 18   Wt 101.6 kg   SpO2 99%   BMI 39.68 kg/m   Physical Exam Vitals and nursing note reviewed.  Constitutional:      General: She is not in acute distress.    Appearance: Normal appearance. She is not ill-appearing.  HENT:     Head: Normocephalic and atraumatic.     Nose: Nose normal.   Eyes:     Conjunctiva/sclera: Conjunctivae normal.   Pulmonary:     Effort: Pulmonary effort is normal. No respiratory distress.   Musculoskeletal:        General: No deformity.     Comments: There is mild tenderness over the right heel and the Achilles tendon otherwise no obvious abnormality such as swelling, bruising, or deformity.  Good plantarflexion as well as dorsiflexion.  There is pain at the extremes of range of motion at the heel.  Neurovascularly intact.  No tenderness over the knee or calf.  Forefoot without any tenderness palpation.  Plantar fascial without any tenderness.   Skin:    Findings: No rash.   Neurological:     Mental Status: She is alert.     (all labs ordered are listed, but only abnormal results are displayed) Labs Reviewed - No data to display  EKG: None  Radiology: No results found.   Procedures   Medications Ordered in the ED - No data to display                                  Medical Decision Making  Medical Decision Making / ED Course   This patient presents to the ED for concern of pain to right heel, this involves an extensive number of treatment options, and is a complaint that carries with it a high risk of complications and morbidity.  The differential diagnosis includes fracture, contusion, Achilles tendinitis, plantar fasciitis  MDM: 68 year old female presents today for concern of pain to the right heel.  Exam overall reassuring with the exception of mild tenderness over the right heel and the Achilles tendon.  Good range of motion,  good strength with plantar flexion, dorsiflexion.  Likely Achilles tendinitis.  She saw a podiatrist within the past 3 years.  Will give her their clinic information to follow-up as needed.  Walking boot provided.  Supportive care discussed. Do not need imaging at this time.  No trauma. Discharged in stable condition.  Return precaution discussed.  Lab Tests: -I ordered, reviewed,  and interpreted labs.   The pertinent results include:   Labs Reviewed - No data to display    EKG  EKG Interpretation Date/Time:    Ventricular Rate:    PR Interval:    QRS Duration:    QT Interval:    QTC Calculation:   R Axis:      Text Interpretation:          Medicines ordered and prescription drug management: No orders of the defined types were placed in this encounter.   -I have reviewed the patients home medicines and have made adjustments as needed   Reevaluation: After the interventions noted above, I reevaluated the patient and found that they have :stayed the same  Co morbidities that complicate the patient evaluation  Past Medical History:  Diagnosis Date   Acute MI, inferior wall (HCC) 08/2016   s/p DES x 2 to the RCA   Allergy    Arthritis    Depression    GERD (gastroesophageal reflux disease)    Hypertension    Meningioma (HCC)    PONV (postoperative nausea and vomiting)    nausea only   Wears dentures    upper      Dispostion: Discharged in stable condition.  Return precaution discussed.  Patient voices understanding and are in agreement with plan.  Final diagnoses:  Achilles tendinitis of right lower extremity    ED Discharge Orders     None          Hildegard Loge, PA-C 05/29/24 1438    Neysa Caron PARAS, DO 05/29/24 1452

## 2024-07-12 ENCOUNTER — Encounter (HOSPITAL_BASED_OUTPATIENT_CLINIC_OR_DEPARTMENT_OTHER): Payer: Self-pay

## 2024-07-12 ENCOUNTER — Emergency Department (HOSPITAL_BASED_OUTPATIENT_CLINIC_OR_DEPARTMENT_OTHER)
Admission: EM | Admit: 2024-07-12 | Discharge: 2024-07-12 | Disposition: A | Discharge status: Home / Self Care | Attending: Emergency Medicine | Admitting: Emergency Medicine

## 2024-07-12 ENCOUNTER — Other Ambulatory Visit: Payer: Self-pay

## 2024-07-12 DIAGNOSIS — Z7982 Long term (current) use of aspirin: Secondary | ICD-10-CM | POA: Diagnosis not present

## 2024-07-12 DIAGNOSIS — M25511 Pain in right shoulder: Secondary | ICD-10-CM | POA: Diagnosis present

## 2024-07-12 DIAGNOSIS — R222 Localized swelling, mass and lump, trunk: Secondary | ICD-10-CM | POA: Diagnosis not present

## 2024-07-12 MED ORDER — LIDOCAINE 5 % EX PTCH
1.0000 | MEDICATED_PATCH | CUTANEOUS | Status: DC
  Administered 2024-07-12: 1 via TRANSDERMAL
  Filled 2024-07-12: qty 1

## 2024-07-12 NOTE — ED Provider Notes (Signed)
 [place] EMERGENCY DEPARTMENT AT [facility] [place] Provider Note   CSN: [id number] Arrival date & time: 07/12/24  9041     Patient presents with: No chief complaint on file.   Theresa Gibson is a 68 y.o. female.   68 year old female presents with complaint of pain in her right upper shoulder area.  States that she has had a mass in this area for years which is slowly been getting larger.  Today the area is more noticeable and somewhat tender with palpation.  Pain is not any changed with range of motion of the shoulder or neck.       Prior to Admission medications   Medication Sig Start Date End Date Taking? Authorizing Provider  acetaminophen  (TYLENOL ) 650 MG CR tablet Take 650 mg by mouth every 8 (eight) hours as needed for pain.    [provider]  albuterol  (VENTOLIN  HFA) 108 (90 Base) MCG/ACT inhaler INHALE 2 PUFFS BY MOUTH EVERY 4 TO 6 HOURS AS NEEDED FOR SHORTNESS OF BREATH WHEEZING OR COUGHING 11/20/23   Norleen Lynwood ORN, MD  aspirin  EC 81 MG tablet Take 81 mg by mouth daily.    [provider]  atorvastatin  (LIPITOR) 40 MG tablet Take 1 tablet (40 mg total) by mouth daily at 6 PM. 11/20/23   Norleen Lynwood ORN, MD  Blood Glucose Monitoring Suppl (ACCU-CHEK GUIDE ME) w/Device KIT Use as directed four times daily E11.9 10/04/20   Norleen Lynwood ORN, MD  buPROPion  (WELLBUTRIN  SR) 150 MG 12 hr tablet Take 1 tablet (150 mg total) by mouth 2 (two) times daily. 11/20/23   Norleen Lynwood ORN, MD  Calcium  Carb-Cholecalciferol  (CALCIUM  600 + D PO) Take 1 tablet by mouth daily.    [provider]  carvedilol  (COREG ) 3.125 MG tablet Take 1 tablet (3.125 mg total) by mouth 2 (two) times daily with a meal. 11/20/23   Norleen Lynwood ORN, MD  cetirizine  (ZYRTEC ) 10 MG tablet Take 1 tablet (10 mg total) by mouth daily. 11/20/23 11/19/24  Norleen Lynwood ORN, MD  Cholecalciferol  (VITAMIN D3 MAXIMUM STRENGTH) 125 MCG (5000 UT) capsule Take 5,000 Units by mouth every Saturday.    [provider]  Dexlansoprazole  (DEXILANT ) 30 MG capsule DR Take 1 capsule (30 mg total) by mouth daily. 11/20/23   Norleen Lynwood ORN, MD  diclofenac  Sodium (VOLTAREN ) 1 % GEL Apply 4 g topically 4 (four) times daily. Patient taking differently: Apply 4 g topically 2 (two) times daily. 06/18/22   Emil Share, DO  glucose blood (ONETOUCH VERIO) test strip USE 1 STRIP TO CHECK GLUCOSE TWICE DAILY AS DIRECTED E11.9 11/20/23   Norleen Lynwood ORN, MD  ibuprofen  (ADVIL ) 200 MG tablet Take 600 mg by mouth every 6 (six) hours as needed for moderate pain.    [provider]  Lancets MISC Use as directed once daily E11.9 Accu-Chek Guide 11/20/23   Norleen Lynwood ORN, MD  losartan  (COZAAR ) 100 MG tablet Take 1 tablet (100 mg total) by mouth daily. 11/20/23   Norleen Lynwood ORN, MD  montelukast  (SINGULAIR ) 10 MG tablet Take 1 tablet (10 mg total) by mouth at bedtime. 11/20/23   Norleen Lynwood ORN, MD  nitroGLYCERIN  (NITROSTAT ) 0.4 MG SL tablet Place 1 tablet (0.4 mg total) under the tongue every 5 (five) minutes as needed for chest pain. 11/20/23   Norleen Lynwood ORN, MD  nystatin  (NYAMYC ) powder APPLY TO AFFECTED AREA(S) ONCE DAILY AS NEEDED AS DIRECTED 02/05/24   Norleen Lynwood ORN, MD  pantoprazole  (  PROTONIX ) 40 MG tablet Take 1 tablet (40 mg total) by mouth daily. 11/20/23   Norleen Lynwood ORN, MD  tirzepatide  (MOUNJARO ) 2.5 MG/0.5ML Pen Inject 2.5 mg into the skin once a week. E11.9 11/20/23   Norleen Lynwood ORN, MD  traMADol  (ULTRAM ) 50 MG tablet Take 1 tablet (50 mg total) by mouth every 6 (six) hours as needed for moderate pain (pain score 4-6). 09/25/23   Norleen Lynwood ORN, MD    Allergies: Other, Ace inhibitors, and Nicotine    Review of Systems Negative except as per HPI Updated Vital Signs BP 123/72   Pulse 77   Temp 97.9 F (36.6 C) (Oral)   Resp 16   Wt 102.1 kg   SpO2 100%   BMI 39.86 kg/m   Physical Exam Vitals and nursing note reviewed.  Constitutional:      General: She is not in acute distress.    Appearance: She is  well-developed. She is not diaphoretic.  HENT:     Head: Normocephalic and atraumatic.  Pulmonary:     Effort: Pulmonary effort is normal.  Musculoskeletal:       Arms:     Comments: Approximately 10cm mass to right upper scapula area without fluctuance or overlying erythema. Area is slightly tender to the touch. Somewhat mobile, not rubbery.   Skin:    General: Skin is warm and dry.     Findings: No bruising, erythema or rash.  Neurological:     Mental Status: She is alert and oriented to person, place, and time.  Psychiatric:        Behavior: Behavior normal.     (all labs ordered are listed, but only abnormal results are displayed) Labs Reviewed - No data to display  EKG: None  Radiology: No results found.   Procedures   Medications Ordered in the ED  lidocaine  (LIDODERM ) 5 % 1 patch (1 patch Transdermal Patch Applied 07/12/24 1123)                                    Medical Decision Making  69 year old female with right posterior shoulder mass present for several years, progressively increasing in size, now feeling noticeable this morning which prompted visit to the ER. Consider lipoma, mass, sebaceous cyst. Does not appear infected today. Discussed with ER attending Dr. Lenor regarding imaging in the ER today, do not suspect abscess, will defer imaging and refer to general surgery for evaluation. Applied lidoderm  patch in the ER today. Can continue with OTC patch PRN or use voltaren  gel. See UC/PCP/return to ER for redness, fevers, any worsening pain.      Final diagnoses:  Mass on back    ED Discharge Orders     None          Beverley Leita DELENA DEVONNA 07/12/24 1131    Lenor Hollering, MD 07/12/24 1144

## 2024-07-12 NOTE — Discharge Instructions (Addendum)
 Follow up with general surgery. Recheck with urgent care or your primary care provider if the area becomes more painful or red. You can apply topical voltaren  gel to the area or lidocaine  patches as needed for discomfort.

## 2024-07-12 NOTE — ED Triage Notes (Signed)
 Reports swelling on back of L shoulder. States swelling has been there for 5+ years but started hurting this morning. Had it checked by PCP and was told not to follow up unless it started hurting. No obvious redness or drainage.   Denies fevers, SHOB, fatigue

## 2024-07-12 NOTE — ED Notes (Signed)
 Nurse Assessment: Reports knot on right upper back. Swelling reported by patient with pain. Pt has a scratch on the knot. Pt reports maybe she was scratching her back and it was cause. Scratch healing without signs of infection. Pt able to move arm without difficulty.

## 2024-07-28 ENCOUNTER — Other Ambulatory Visit: Payer: Self-pay | Admitting: Internal Medicine

## 2024-09-03 ENCOUNTER — Telehealth: Payer: Self-pay

## 2024-09-03 NOTE — Telephone Encounter (Signed)
 Copied from CRM [id number]. Topic: Clinical - Prescription Issue >> Sep 03, 2024 12:29 PM [place] E wrote: Reason for CRM: Ivette with [facility] called in stating that they are getting a rejection for patient's OneTouch, but insurance would cover either Accucheck or Contour Plus. Callback number for [facility] is [phone].

## 2024-09-15 ENCOUNTER — Other Ambulatory Visit: Payer: Self-pay

## 2024-09-15 ENCOUNTER — Encounter (HOSPITAL_COMMUNITY): Payer: Self-pay | Admitting: General Surgery

## 2024-09-15 NOTE — Progress Notes (Signed)
 Theresa Gibson CALL  Patient was given pre-op instructions over the phone. The opportunity was given for the patient to ask questions. No further questions asked. Patient verbalized understanding of instructions given.   PCP - Rojelio Daring at [facility] Cardiologist - Dr. Elspeth Kitten - LOV - 09/11/23 with follow up in 1 year - cardiac clearance received and in media tab  PPM/ICD - denies Device Orders - n/a Rep Notified - n/a  Chest x-ray - denies EKG - DOS Stress Test - 03/29/21  ECHO -  Cardiac Cath - 2017  Sleep Study - denies CPAP - n/a  Fasting Blood Sugar - 70 Checks Blood Sugar _1-2__ times a day  Patient aware to check blood sugar on the day of surgery and if she has a blood sugar less than 70, she is aware to drink 1/2 cup of apple or cranberry juice and then recheck blood sugar in 15 minutes.   Last dose of GLP1 agonist-  last dose of mounjaro - 10/9 GLP1 instructions: patient aware not to take Mounjaro  prior to surgery  Blood Thinner Instructions: n/a Aspirin  Instructions: last dose of Aspirin  10/14  ERAS Protcol - clears until 0730 PRE-SURGERY Ensure or G2- n/a  COVID TEST- n/a   Anesthesia review: yes - cardiac history, cardiac clearance  Patient denies shortness of breath, fever, cough and chest pain over the phone call  Patient states that she has never had to take her nitroglycerin .    All instructions explained to the patient, with a verbal understanding of the material. Patient agrees to go over the instructions while at home for a better understanding.

## 2024-09-15 NOTE — Progress Notes (Signed)
 Requested that cardiac clearance letter to be faxed to [phone] from Dr. Alger office.

## 2024-09-16 ENCOUNTER — Ambulatory Visit: Payer: Self-pay | Admitting: General Surgery

## 2024-09-16 NOTE — Anesthesia Preprocedure Evaluation (Signed)
 Anesthesia Evaluation  Patient identified by MRN, date of birth, ID band Patient awake    Reviewed: Allergy & Precautions, NPO status , Patient's Chart, lab work & pertinent test results  History of Anesthesia Complications (+) PONV and history of anesthetic complications  Airway Mallampati: III  TM Distance: >3 FB Neck ROM: Full    Dental  (+) Dental Advisory Given   Pulmonary sleep apnea and Continuous Positive Airway Pressure Ventilation , former smoker   breath sounds clear to auscultation       Cardiovascular hypertension, Pt. on medications and Pt. on home beta blockers + CAD, + Past MI and + Cardiac Stents   Rhythm:Regular Rate:Normal     Neuro/Psych  Headaches    GI/Hepatic Neg liver ROS,GERD  ,,  Endo/Other  diabetes, Type 2    Renal/GU negative Renal ROS     Musculoskeletal  (+) Arthritis ,    Abdominal   Peds  Hematology negative hematology ROS (+)   Anesthesia Other Findings   Reproductive/Obstetrics                              Anesthesia Physical Anesthesia Plan  ASA: 3  Anesthesia Plan: General   Post-op Pain Management: Tylenol  PO (pre-op)*   Induction: Intravenous  PONV Risk Score and Plan: 4 or greater and Dexamethasone , Ondansetron  and Treatment may vary due to age or medical condition  Airway Management Planned: Oral ETT  Additional Equipment:   Intra-op Plan:   Post-operative Plan: Extubation in OR  Informed Consent: I have reviewed the patients History and Physical, chart, labs and discussed the procedure including the risks, benefits and alternatives for the proposed anesthesia with the patient or authorized representative who has indicated his/her understanding and acceptance.     Dental advisory given  Plan Discussed with: CRNA  Anesthesia Plan Comments: (PAT note by Lynwood Hope, PA-C: Follows with cardiology at [facility] for history of HTN,  HLD, CAD s/p MI with DES x 2 to RCA in 2017.  Most recent stress echo 03/29/2021 was nonischemic.  Cardiac clearance from Dr. Maralee dated 07/28/2024 states, She was last seen in our office on 09/11/2023.  At that time she was stable for the planned procedure from a cardiac standpoint.  No further testing is required at this time assuming she has been clinically stable since that time.  If antiplatelet therapy is to be held that should be for a short of time as it is prudent.  Copy of clearance scanned in media.  Other pertinent history includes former smoker (quit 2019), PONV, GERD on PPI, non-insulin -dependent DM2, A1c 5.9 on 05/05/2024.  Patient reports last dose of Mounjaro  09/10/2024.  She will need day of surgery labs and evaluation.  EKG 11/19/2022: Normal sinus rhythm with sinus arrhythmia.  Rate 84. Minimal voltage criteria for LVH, may be normal variant.  Exercise stress echo 03/29/2021 (Care Everywhere): SUMMARY  The patient had no chest pain.  The patient achieved 86 % of maximum predicted heart rate.  The METS achieved was 5.  Exercise capacity was fair.  Normal left ventricular function and global wall motion with stress.  Negative exercise echocardiography for inducible ischemia at target  heart rate.   )         Anesthesia Quick Evaluation

## 2024-09-16 NOTE — Progress Notes (Signed)
 Anesthesia Chart Review: Same day workup  Follows with cardiology at [facility] for history of HTN, HLD, CAD s/p MI with DES x 2 to RCA in 2017.  Most recent stress echo 03/29/2021 was nonischemic.  Cardiac clearance from Dr. Maralee dated 07/28/2024 states, She was last seen in our office on 09/11/2023.  At that time she was stable for the planned procedure from a cardiac standpoint.  No further testing is required at this time assuming she has been clinically stable since that time.  If antiplatelet therapy is to be held that should be for a short of time as it is prudent.  Copy of clearance scanned in media.  Other pertinent history includes former smoker (quit 2019), PONV, GERD on PPI, non-insulin -dependent DM2, A1c 5.9 on 05/05/2024.  Patient reports last dose of Mounjaro  09/10/2024.  She will need day of surgery labs and evaluation.  EKG 11/19/2022: Normal sinus rhythm with sinus arrhythmia.  Rate 84. Minimal voltage criteria for LVH, may be normal variant.  Exercise stress echo 03/29/2021 (Care Everywhere): SUMMARY  The patient had no chest pain.  The patient achieved 86 % of maximum predicted heart rate.  The METS achieved was 5.  Exercise capacity was fair.  Normal left ventricular function and global wall motion with stress.  Negative exercise echocardiography for inducible ischemia at target  heart rate.      Lynwood Geofm RIGGERS [facility] Short Stay Center/Anesthesiology Phone [phone] 09/16/2024 1:48 PM

## 2024-09-17 ENCOUNTER — Ambulatory Visit (HOSPITAL_COMMUNITY): Admitting: Physician Assistant

## 2024-09-17 ENCOUNTER — Ambulatory Visit (HOSPITAL_COMMUNITY)
Admission: RE | Admit: 2024-09-17 | Discharge: 2024-09-17 | Disposition: A | Discharge status: Home / Self Care | Attending: General Surgery | Admitting: General Surgery

## 2024-09-17 ENCOUNTER — Encounter (HOSPITAL_COMMUNITY)
Admission: RE | Disposition: A | Discharge status: Home / Self Care | Payer: Self-pay | Source: Home / Self Care | Attending: General Surgery

## 2024-09-17 ENCOUNTER — Other Ambulatory Visit: Payer: Self-pay

## 2024-09-17 ENCOUNTER — Encounter (HOSPITAL_COMMUNITY): Payer: Self-pay | Admitting: General Surgery

## 2024-09-17 DIAGNOSIS — K219 Gastro-esophageal reflux disease without esophagitis: Secondary | ICD-10-CM | POA: Diagnosis not present

## 2024-09-17 DIAGNOSIS — G4730 Sleep apnea, unspecified: Secondary | ICD-10-CM | POA: Insufficient documentation

## 2024-09-17 DIAGNOSIS — E785 Hyperlipidemia, unspecified: Secondary | ICD-10-CM | POA: Insufficient documentation

## 2024-09-17 DIAGNOSIS — Z87891 Personal history of nicotine dependence: Secondary | ICD-10-CM | POA: Insufficient documentation

## 2024-09-17 DIAGNOSIS — Z7985 Long-term (current) use of injectable non-insulin antidiabetic drugs: Secondary | ICD-10-CM | POA: Diagnosis not present

## 2024-09-17 DIAGNOSIS — Z7982 Long term (current) use of aspirin: Secondary | ICD-10-CM | POA: Insufficient documentation

## 2024-09-17 DIAGNOSIS — I10 Essential (primary) hypertension: Secondary | ICD-10-CM

## 2024-09-17 DIAGNOSIS — D171 Benign lipomatous neoplasm of skin and subcutaneous tissue of trunk: Secondary | ICD-10-CM | POA: Diagnosis present

## 2024-09-17 DIAGNOSIS — E119 Type 2 diabetes mellitus without complications: Secondary | ICD-10-CM | POA: Insufficient documentation

## 2024-09-17 DIAGNOSIS — I252 Old myocardial infarction: Secondary | ICD-10-CM | POA: Diagnosis not present

## 2024-09-17 DIAGNOSIS — Z955 Presence of coronary angioplasty implant and graft: Secondary | ICD-10-CM | POA: Insufficient documentation

## 2024-09-17 DIAGNOSIS — I2510 Atherosclerotic heart disease of native coronary artery without angina pectoris: Secondary | ICD-10-CM

## 2024-09-17 HISTORY — DX: Type 2 diabetes mellitus without complications: E11.9

## 2024-09-17 LAB — BASIC METABOLIC PANEL WITH GFR
Anion gap: 11 (ref 5–15)
BUN: 18 mg/dL (ref 8–23)
CO2: 20 mmol/L — ABNORMAL LOW (ref 22–32)
Calcium: 8.9 mg/dL (ref 8.9–10.3)
Chloride: 107 mmol/L (ref 98–111)
Creatinine, Ser: 0.78 mg/dL (ref 0.44–1.00)
GFR, Estimated: >60 mL/min (ref >60)
Glucose, Bld: 116 mg/dL — ABNORMAL HIGH (ref 70–99)
Potassium: 4.1 mmol/L (ref 3.5–5.1)
Sodium: 138 mmol/L (ref 135–145)

## 2024-09-17 LAB — CBC
HCT: 42.6 % (ref 36.0–46.0)
Hemoglobin: 13.7 g/dL (ref 12.0–15.0)
MCH: 27.7 pg (ref 26.0–34.0)
MCHC: 32.2 g/dL (ref 30.0–36.0)
MCV: 86.1 fL (ref 80.0–100.0)
Platelets: 251 K/uL (ref 150–400)
RBC: 4.95 MIL/uL (ref 3.87–5.11)
RDW: 15 % (ref 11.5–15.5)
WBC: 5.8 K/uL (ref 4.0–10.5)
nRBC: 0 % (ref 0.0–0.2)

## 2024-09-17 LAB — GLUCOSE, CAPILLARY
Glucose-Capillary: 113 mg/dL — ABNORMAL HIGH (ref 70–99)
Glucose-Capillary: 84 mg/dL (ref 70–99)
Glucose-Capillary: 103 mg/dL — ABNORMAL HIGH (ref 70–99)

## 2024-09-17 SURGERY — EXCISION, MASS, UPPER EXTREMITY
Anesthesia: General | Site: Shoulder

## 2024-09-17 MED ORDER — BUPIVACAINE HCL (PF) 0.25 % IJ SOLN
INTRAMUSCULAR | Status: AC
  Filled 2024-09-17: qty 30

## 2024-09-17 MED ORDER — OXYCODONE HCL 5 MG/5ML PO SOLN: 5.0000 mg | Freq: Once | ORAL | Status: DC | PRN

## 2024-09-17 MED ORDER — CHLORHEXIDINE GLUCONATE CLOTH 2 % EX PADS: 6.0000 | MEDICATED_PAD | Freq: Once | CUTANEOUS | Status: DC

## 2024-09-17 MED ORDER — DEXAMETHASONE SOD PHOSPHATE PF 10 MG/ML IJ SOLN
INTRAMUSCULAR | Status: DC | PRN
  Administered 2024-09-17: 5 mg via INTRAVENOUS

## 2024-09-17 MED ORDER — ACETAMINOPHEN 500 MG PO TABS
1000.0000 mg | ORAL_TABLET | ORAL | Status: AC
  Administered 2024-09-17: 1000 mg via ORAL
  Filled 2024-09-17: qty 2

## 2024-09-17 MED ORDER — FENTANYL CITRATE (PF) 100 MCG/2ML IJ SOLN
INTRAMUSCULAR | Status: AC
  Filled 2024-09-17: qty 2

## 2024-09-17 MED ORDER — CHLORHEXIDINE GLUCONATE 0.12 % MT SOLN
15.0000 mL | Freq: Once | OROMUCOSAL | Status: AC
  Administered 2024-09-17: 15 mL via OROMUCOSAL
  Filled 2024-09-17: qty 15

## 2024-09-17 MED ORDER — PHENYLEPHRINE HCL-NACL 20-0.9 MG/250ML-% IV SOLN
INTRAVENOUS | Status: DC | PRN
  Administered 2024-09-17: 30 ug/min via INTRAVENOUS

## 2024-09-17 MED ORDER — 0.9 % SODIUM CHLORIDE (POUR BTL) OPTIME
TOPICAL | Status: DC | PRN
  Administered 2024-09-17: 250 mL

## 2024-09-17 MED ORDER — AMISULPRIDE (ANTIEMETIC) 5 MG/2ML IV SOLN
10.0000 mg | Freq: Once | INTRAVENOUS | Status: AC | PRN
  Administered 2024-09-17: 10 mg via INTRAVENOUS

## 2024-09-17 MED ORDER — ENSURE PRE-SURGERY PO LIQD: 296.0000 mL | Freq: Once | ORAL | Status: DC

## 2024-09-17 MED ORDER — ONDANSETRON HCL 4 MG/2ML IJ SOLN
INTRAMUSCULAR | Status: AC
  Filled 2024-09-17: qty 2

## 2024-09-17 MED ORDER — INSULIN ASPART 100 UNIT/ML IJ SOLN: 0.0000 [IU] | INTRAMUSCULAR | Status: DC | PRN

## 2024-09-17 MED ORDER — LIDOCAINE 2% (20 MG/ML) 5 ML SYRINGE
INTRAMUSCULAR | Status: AC
  Filled 2024-09-17: qty 5

## 2024-09-17 MED ORDER — ROCURONIUM BROMIDE 10 MG/ML (PF) SYRINGE
PREFILLED_SYRINGE | INTRAVENOUS | Status: DC | PRN
  Administered 2024-09-17: 60 mg via INTRAVENOUS

## 2024-09-17 MED ORDER — MIDAZOLAM HCL (PF) 2 MG/2ML IJ SOLN
INTRAMUSCULAR | Status: DC | PRN
  Administered 2024-09-17: 2 mg via INTRAVENOUS

## 2024-09-17 MED ORDER — PROPOFOL 10 MG/ML IV BOLUS
INTRAVENOUS | Status: AC
  Filled 2024-09-17: qty 20

## 2024-09-17 MED ORDER — MIDAZOLAM HCL 2 MG/2ML IJ SOLN
INTRAMUSCULAR | Status: AC
  Filled 2024-09-17: qty 2

## 2024-09-17 MED ORDER — ONDANSETRON HCL 4 MG/2ML IJ SOLN
INTRAMUSCULAR | Status: DC | PRN
  Administered 2024-09-17: 4 mg via INTRAVENOUS

## 2024-09-17 MED ORDER — PHENYLEPHRINE 80 MCG/ML (10ML) SYRINGE FOR IV PUSH (FOR BLOOD PRESSURE SUPPORT)
PREFILLED_SYRINGE | INTRAVENOUS | Status: DC | PRN
  Administered 2024-09-17: 160 ug via INTRAVENOUS

## 2024-09-17 MED ORDER — ORAL CARE MOUTH RINSE: 15.0000 mL | Freq: Once | OROMUCOSAL | Status: AC

## 2024-09-17 MED ORDER — OXYCODONE HCL 5 MG PO TABS: 5.0000 mg | ORAL_TABLET | Freq: Once | ORAL | Status: DC | PRN

## 2024-09-17 MED ORDER — FENTANYL CITRATE (PF) 100 MCG/2ML IJ SOLN
25.0000 ug | INTRAMUSCULAR | Status: DC | PRN
  Administered 2024-09-17: 50 ug via INTRAVENOUS

## 2024-09-17 MED ORDER — PROPOFOL 10 MG/ML IV BOLUS
INTRAVENOUS | Status: DC | PRN
  Administered 2024-09-17: 200 mg via INTRAVENOUS

## 2024-09-17 MED ORDER — TRAMADOL HCL 50 MG PO TABS: 50.0000 mg | ORAL_TABLET | Freq: Four times a day (QID) | ORAL | 0 refills | Status: AC | PRN

## 2024-09-17 MED ORDER — BUPIVACAINE-EPINEPHRINE 0.25% -1:200000 IJ SOLN
INTRAMUSCULAR | Status: DC | PRN
  Administered 2024-09-17: 10 mL

## 2024-09-17 MED ORDER — LIDOCAINE 2% (20 MG/ML) 5 ML SYRINGE
INTRAMUSCULAR | Status: DC | PRN
  Administered 2024-09-17: 60 mg via INTRAVENOUS

## 2024-09-17 MED ORDER — SUGAMMADEX SODIUM 200 MG/2ML IV SOLN
INTRAVENOUS | Status: DC | PRN
  Administered 2024-09-17: 200 mg via INTRAVENOUS

## 2024-09-17 MED ORDER — FENTANYL CITRATE (PF) 250 MCG/5ML IJ SOLN
INTRAMUSCULAR | Status: DC | PRN
  Administered 2024-09-17: 100 ug via INTRAVENOUS

## 2024-09-17 MED ORDER — CEFAZOLIN SODIUM-DEXTROSE 2-4 GM/100ML-% IV SOLN
2.0000 g | INTRAVENOUS | Status: AC
Start: 2024-09-17 — End: 2024-09-17
  Administered 2024-09-17: 2 g via INTRAVENOUS
  Filled 2024-09-17: qty 100

## 2024-09-17 MED ORDER — LACTATED RINGERS IV SOLN: INTRAVENOUS | Status: DC

## 2024-09-17 MED ORDER — ROCURONIUM BROMIDE 10 MG/ML (PF) SYRINGE
PREFILLED_SYRINGE | INTRAVENOUS | Status: AC
  Filled 2024-09-17: qty 10

## 2024-09-17 MED ORDER — AMISULPRIDE (ANTIEMETIC) 5 MG/2ML IV SOLN
INTRAVENOUS | Status: AC
  Filled 2024-09-17: qty 4

## 2024-09-17 SURGICAL SUPPLY — 31 items
BAG COUNTER SPONGE SURGICOUNT (BAG) ×2 IMPLANT
BENZOIN TINCTURE PRP APPL 2/3 (GAUZE/BANDAGES/DRESSINGS) ×2 IMPLANT
BLADE CLIPPER SURG (BLADE) IMPLANT
CANISTER SUCTION 3000ML PPV (SUCTIONS) IMPLANT
CHLORAPREP W/TINT 26 (MISCELLANEOUS) ×2 IMPLANT
COVER SURGICAL LIGHT HANDLE (MISCELLANEOUS) ×2 IMPLANT
DERMABOND ADVANCED .7 DNX12 (GAUZE/BANDAGES/DRESSINGS) IMPLANT
DRAPE LAPAROTOMY 100X72 PEDS (DRAPES) IMPLANT
DRAPE SURG ORHT 6 SPLT 77X108 (DRAPES) IMPLANT
ELECTRODE REM PT RTRN 9FT ADLT (ELECTROSURGICAL) ×2 IMPLANT
GAUZE SPONGE 4X4 12PLY STRL (GAUZE/BANDAGES/DRESSINGS) ×2 IMPLANT
GLOVE BIO SURGEON STRL SZ7.5 (GLOVE) ×4 IMPLANT
GLOVE BIOGEL PI IND STRL 8 (GLOVE) ×2 IMPLANT
GOWN STRL REUS W/ TWL LRG LVL3 (GOWN DISPOSABLE) ×2 IMPLANT
GOWN STRL REUS W/ TWL XL LVL3 (GOWN DISPOSABLE) ×2 IMPLANT
KIT BASIN OR (CUSTOM PROCEDURE TRAY) ×2 IMPLANT
KIT TURNOVER KIT B (KITS) ×2 IMPLANT
NDL HYPO 25GX1X1/2 BEV (NEEDLE) ×2 IMPLANT
NEEDLE HYPO 25GX1X1/2 BEV (NEEDLE) ×1 IMPLANT
PACK GENERAL/GYN (CUSTOM PROCEDURE TRAY) ×2 IMPLANT
PAD ARMBOARD POSITIONER FOAM (MISCELLANEOUS) ×2 IMPLANT
PENCIL SMOKE EVACUATOR (MISCELLANEOUS) ×2 IMPLANT
SOLN 0.9% NACL 1000 ML (IV SOLUTION) ×1 IMPLANT
SOLN 0.9% NACL POUR BTL 1000ML (IV SOLUTION) ×2 IMPLANT
STRIP CLOSURE SKIN 1/2X4 (GAUZE/BANDAGES/DRESSINGS) ×2 IMPLANT
SUT MNCRL AB 4-0 PS2 18 (SUTURE) ×2 IMPLANT
SUT VIC AB 3-0 SH 18 (SUTURE) ×2 IMPLANT
SYR CONTROL 10ML LL (SYRINGE) ×2 IMPLANT
TOWEL GREEN STERILE (TOWEL DISPOSABLE) ×2 IMPLANT
TOWEL GREEN STERILE FF (TOWEL DISPOSABLE) ×2 IMPLANT
UNDERPAD 30X36 HEAVY ABSORB (UNDERPADS AND DIAPERS) IMPLANT

## 2024-09-17 NOTE — H&P (Signed)
 Chief Complaint: New Consultation ( Eval of Mass on Back, notes in [facility])       History of Present Illness: Theresa Gibson is a 68 y.o. female who is seen today as an office consultation at the request of Dr. Room for evaluation of New Consultation ( Eval of Mass on Back, notes in [place]) .   History of Present Illness Theresa Gibson is a 68 year old female with a history of diabetes, hypertension, and myocardial infarction who presents with a lump on her back.   The lump on her back has been present for approximately five years and has recently been noted to enlarge over the past two to three months. It has become hot and painful, but there is no discharge or signs of infection.   Her medical history includes well-controlled diabetes managed with Ozempic  and hypertension. She experienced a myocardial infarction six years ago and is under cardiology care, taking aspirin  nightly. She denies the use of other blood thinners such as Plavix.     She sees Dr. Maralee in [place]       Review of Systems: A complete review of systems was obtained from the patient.  I have reviewed this information and discussed as appropriate with the patient.  See HPI as well for other ROS.   Review of Systems  Constitutional:  Negative for fever.  HENT:  Negative for congestion.   Eyes:  Negative for blurred vision.  Respiratory:  Negative for cough, shortness of breath and wheezing.   Cardiovascular:  Negative for chest pain and palpitations.  Gastrointestinal:  Negative for heartburn.  Genitourinary:  Negative for dysuria.  Musculoskeletal:  Negative for myalgias.  Skin:  Negative for rash.  Neurological:  Negative for dizziness and headaches.  Psychiatric/Behavioral:  Negative for depression and suicidal ideas.   All other systems reviewed and are negative.       Medical History:     Past Medical History:  Diagnosis Date   Arthritis     Diabetes mellitus without complication (CMS/HHS-HCC)      Hypertension        There is no problem list on file for this patient.          Past Surgical History:  Procedure Laterality Date   .knee surgery               Allergies  Allergen Reactions   Other Nausea And Vomiting      General anesthesia   Ace Inhibitors Cough and Other (See Comments)      Cough   REACTION: Dry cough-lisinopril   Dry cough-lisinopril   Nicotine Other (See Comments) and Rash      Patch...caused a rash            Current Outpatient Medications on File Prior to Visit  Medication Sig Dispense Refill   aspirin  81 MG EC tablet Take 81 mg by mouth once daily       atorvastatin  (LIPITOR) 40 MG tablet Take 40 mg by mouth       buPROPion  (WELLBUTRIN  SR) 150 MG SR tablet Take 150 mg by mouth 2 (two) times daily       carvediloL  (COREG ) 3.125 MG tablet Take 3.125 mg by mouth 2 (two) times daily with meals       cetirizine  (ZYRTEC ) 10 MG tablet Take 10 mg by mouth once daily       cholecalciferol  (VITAMIN D3) 5,000 unit capsule Take 5,000 Units by mouth  dexlansoprazole  (DEXILANT ) 30 mg DR capsule Take 30 mg by mouth once daily       diclofenac  (VOLTAREN ) 1 % topical gel Apply 4 g topically 4 (four) times daily       losartan  (COZAAR ) 100 MG tablet Take 100 mg by mouth once daily       montelukast  (SINGULAIR ) 10 mg tablet Take 10 mg by mouth at bedtime       nitroGLYcerin  (NITROSTAT ) 0.4 MG SL tablet Place 0.4 mg under the tongue every 5 (five) minutes as needed for Chest pain       nystatin  (MYCOSTATIN ) 100,000 unit/gram powder APPLY TO AFFECTED AREA(S) ONCE DAILY AS NEEDED AS DIRECTED       ONETOUCH VERIO TEST STRIPS test strip USE TO TEST BLOOD SUGAR TWICE DAILY AS DIRECTED       pantoprazole  (PROTONIX ) 40 MG DR tablet Take 40 mg by mouth once daily        No current facility-administered medications on file prior to visit.           Family History  Problem Relation Age of Onset   Stroke Mother     High blood pressure (Hypertension) Mother      Hyperlipidemia (Elevated cholesterol) Mother     Coronary Artery Disease (Blocked arteries around heart) Father        Social History       Tobacco Use  Smoking Status Never  Smokeless Tobacco Never      Social History        Socioeconomic History   Marital status: Single  Tobacco Use   Smoking status: Never   Smokeless tobacco: Never  Vaping Use   Vaping status: Unknown  Substance and Sexual Activity   Alcohol use: Never   Drug use: Never    Social Drivers of Health        Financial Resource Strain: Medium Risk (09/17/2023)    Received from [facility]    Overall Financial Resource Strain (CARDIA)     Difficulty of Paying Living Expenses: Somewhat hard  Food Insecurity: Low Risk  (10/03/2023)    Received from [facility]    Hunger Vital Sign     Within the past 12 months, you worried that your food would run out before you got money to buy more: Never true     Within the past 12 months, the food you bought just didn't last and you didn't have money to get more. : Never true  Recent Concern: Food Insecurity - Food Insecurity Present (09/17/2023)    Received from [facility]    Hunger Vital Sign     Within the past 12 months, you worried that your food would run out before you got the money to buy more.: Sometimes true     Within the past 12 months, the food you bought just didn't last and you didn't have money to get more.: Sometimes true  Transportation Needs: No Transportation Needs (10/03/2023)    Received from [employer]     In the past 12 months, has lack of reliable transportation kept you from medical appointments, meetings, work or from getting things needed for daily living? : No  Physical Activity: Insufficiently Active (09/17/2023)    Received from [facility]    Exercise Vital Sign     On average, how many days per week do you engage in moderate to strenuous exercise (like a brisk walk)?: 3 days     On  average, how many minutes do  you engage in exercise at this level?: 10 min  Stress: Stress Concern Present (09/17/2023)    Received from [facility] - Occupational Stress Questionnaire     Feeling of Stress : To some extent  Social Connections: Moderately Isolated (09/17/2023)    Received from [facility]    Social Connection and Isolation Panel     In a typical week, how many times do you talk on the phone with family, friends, or neighbors?: More than three times a week     How often do you get together with friends or relatives?: Twice a week     How often do you attend church or religious services?: 1 to 4 times per year     Do you belong to any clubs or organizations such as church groups, unions, fraternal or athletic groups, or school groups?: No     How often do you attend meetings of the clubs or organizations you belong to?: Never     Are you married, widowed, divorced, separated, never married, or living with a partner?: Divorced  Housing Stability: Low Risk  (05/05/2024)    Received from [employer] Stability Vital Sign     What is your living situation today?: I have a steady place to live      Objective:   BP (!) 117/59   Pulse 68   Temp 97.7 F (36.5 C) (Oral)   Resp 17   Ht 5' 4 (1.626 m)   Wt 103 kg   SpO2 100%   BMI 38.96 kg/m     Body mass index is 38.79 kg/m.   Physical Exam Constitutional:      Appearance: Normal appearance.  HENT:     Head: Normocephalic and atraumatic.     Mouth/Throat:     Mouth: Mucous membranes are moist.     Pharynx: Oropharynx is clear.  Eyes:     General: No scleral icterus.    Pupils: Pupils are equal, round, and reactive to light.  Cardiovascular:     Rate and Rhythm: Normal rate and regular rhythm.     Pulses: Normal pulses.     Heart sounds: No murmur heard.    No friction rub. No gallop.  Pulmonary:     Effort: Pulmonary effort is normal. No respiratory distress.     Breath sounds:  Normal breath sounds. No stridor.  Abdominal:     General: Abdomen is flat.  Musculoskeletal:        General: No swelling.  Skin:    General: Skin is warm.         Comments: 10x10cm soft , SQ mass  Neurological:     General: No focal deficit present.     Mental Status: She is alert and oriented to person, place, and time. Mental status is at baseline.  Psychiatric:        Mood and Affect: Mood normal.        Thought Content: Thought content normal.        Judgment: Judgment normal.         Assessment and Plan:  Diagnoses and all orders for this visit:   Lipoma of back     Theresa Gibson is a 68 y.o. female    Assessment & Plan Back lipoma Lipoma on the right side of the back, present for five years, with recent increase in size and  episodes of pain and warmth. Likely a benign fatty tumor, common and non-metastatic. - - Discuss potential surgical complications including wound infection, bleeding, bruising, and recurrence.           No follow-ups on file.   Theresa Leos, MD, [facility], [place] [facility]

## 2024-09-17 NOTE — Anesthesia Procedure Notes (Signed)
 Procedure Name: Intubation Date/Time: 09/17/2024 10:31 AM  Performed by: Elby Raelene SAUNDERS, CRNAPre-anesthesia Checklist: Patient identified, Emergency Drugs available, Suction available and Patient being monitored Patient Re-evaluated:Patient Re-evaluated prior to induction Oxygen Delivery Method: Circle System Utilized Preoxygenation: Pre-oxygenation with 100% oxygen Induction Type: IV induction Ventilation: Mask ventilation without difficulty Laryngoscope Size: Miller and 2 Grade View: Grade I Tube type: Oral Number of attempts: 1 Airway Equipment and Method: Stylet and Bite block Placement Confirmation: ETT inserted through vocal cords under direct vision, positive ETCO2 and breath sounds checked- equal and bilateral Secured at: 22 cm Tube secured with: Tape Dental Injury: Teeth and Oropharynx as per pre-operative assessment

## 2024-09-17 NOTE — Anesthesia Postprocedure Evaluation (Signed)
 Anesthesia Post Note  Patient: Theresa Gibson  Procedure(s) Performed: EXCISION, MASS, UPPER EXTREMITY (Shoulder)     Patient location during evaluation: PACU Anesthesia Type: General Level of consciousness: awake and alert Pain management: pain level controlled Vital Signs Assessment: post-procedure vital signs reviewed and stable Respiratory status: spontaneous breathing, nonlabored ventilation, respiratory function stable and patient connected to nasal cannula oxygen Cardiovascular status: blood pressure returned to baseline and stable Postop Assessment: no apparent nausea or vomiting Anesthetic complications: no   No notable events documented.  Last Vitals:  Vitals:   09/17/24 1200 09/17/24 1215  BP: 137/74 (!) 142/73  Pulse: 67 71  Resp: (!) 21 (!) 22  Temp:  36.7 C  SpO2: 94% 96%    Last Pain:  Vitals:   09/17/24 1215  TempSrc:   PainSc: 3                  Epifanio Lamar BRAVO

## 2024-09-17 NOTE — Transfer of Care (Signed)
 Immediate Anesthesia Transfer of Care Note  Patient: [name]  Procedure(s) Performed: EXCISION, MASS, UPPER EXTREMITY (Shoulder)  Patient Location: PACU  Anesthesia Type:General  Level of Consciousness: awake, alert , and oriented  Airway & Oxygen Therapy: Patient Spontanous Breathing  Post-op Assessment: Report given to RN and Post -op Vital signs reviewed and stable  Post vital signs: Reviewed and stable  Last Vitals:  Vitals Value Taken Time  BP 99/84 09/17/24 11:27  Temp    Pulse 78 09/17/24 11:29  Resp 15 09/17/24 11:29  SpO2 94 % 09/17/24 11:29  Vitals shown include unfiled device data.  Last Pain:  Vitals:   09/17/24 0843  TempSrc:   PainSc: 0-No pain         Complications: No notable events documented.

## 2024-09-17 NOTE — Op Note (Signed)
 Back Mass Excision Procedure Note  Indications: The patient presented with a history of a symptomatic right upper back mass for many years.  Pre-operative Diagnosis: Right upper back lipoma Post-operative Diagnosis: same  Surgeon:  Lynda Leos, Attending Surgeon Fairy Costain, Surgical Resident   Assistants: None  Anesthesia: General endotracheal anesthesia  Procedure Details  The patient was consented for the above procedure and taken to the OR. General anesthesia was induced, bilateral SCDs were placed, and she was appropriately positioned in the lateral decubitus position with all pressure points padded. She was prepped and draped in standard fashion. Timeout was called and all facts verified.  The mass was easily palpated on the patient's right upper back. A horizontal incision was made directly over the mass. Dissection was carried down through the subcutaneous tissues until we identified the mass which appeared consistent with a lipoma. We continued our dissection circumferentially around the mass and completely excised the specimen. This was sent to pathology. Cautery was used to obtain hemostasis. The wound was irrigated and hemostasis again confirmed. At this time, we elected to close the wound primarily as it appeared to come together without tension. 3-0 Vicryl sutures were used to reapproximate the deep muscle and dermal layers. A 4-0 Monocryl was used to reapproximate the skin and Dermabond was applied. The patient tied the procedure well was taken to recovery room stable condition.  Estimated Blood Loss: Minimal           Complications: None; patient tolerated the procedure well.         Disposition: PACU - hemodynamically stable.         Condition: stable

## 2024-09-18 ENCOUNTER — Encounter (HOSPITAL_COMMUNITY): Payer: Self-pay | Admitting: General Surgery

## 2024-09-22 LAB — SURGICAL PATHOLOGY

## 2024-10-05 ENCOUNTER — Other Ambulatory Visit: Payer: Self-pay | Admitting: Internal Medicine

## 2024-10-07 ENCOUNTER — Other Ambulatory Visit: Payer: Self-pay | Admitting: Internal Medicine

## 2024-10-07 DIAGNOSIS — I10 Essential (primary) hypertension: Secondary | ICD-10-CM

## 2024-10-20 ENCOUNTER — Other Ambulatory Visit: Payer: Self-pay | Admitting: Internal Medicine

## 2024-10-20 ENCOUNTER — Other Ambulatory Visit: Payer: Self-pay

## 2024-10-20 DIAGNOSIS — I10 Essential (primary) hypertension: Secondary | ICD-10-CM

## 2024-12-07 DIAGNOSIS — Z1231 Encounter for screening mammogram for malignant neoplasm of breast: Secondary | ICD-10-CM

## 2024-12-22 ENCOUNTER — Ambulatory Visit

## 2024-12-30 ENCOUNTER — Telehealth: Payer: Self-pay

## 2024-12-30 DIAGNOSIS — Z87891 Personal history of nicotine dependence: Secondary | ICD-10-CM

## 2024-12-30 DIAGNOSIS — Z122 Encounter for screening for malignant neoplasm of respiratory organs: Secondary | ICD-10-CM

## 2024-12-30 NOTE — Telephone Encounter (Signed)
 Lung Cancer Screening Narrative/Criteria Questionnaire (Cigarette Smokers Only- No Cigars/Pipes/vapes)   Alaynah M Akerley   SDMV:01/08/2025 9:00 am Natalie  Aug 25, 1956               LDCT: 01/13/2025 at 9:40 am  GI   69 y.o.   Phone: [phone]  Lung Screening Narrative (confirm age 17-77 yrs Medicare / 50-80 yrs Private pay insurance)   Insurance information: [employer]   Referring Provider: Self Referral    This screening involves an initial phone call with a team member from our program. It is called a shared decision making visit. The initial meeting is required by  insurance and Medicare to make sure you understand the program. This appointment takes about 15-20 minutes to complete. You will complete the screening scan at your scheduled date/time.  This scan takes about 5-10 minutes to complete. You can eat and drink normally before and after the scan.  Criteria questions for Lung Cancer Screening:   Are you a current or former smoker? Former Age began smoking: 18   If you are a former smoker, what year did you quit smoking? Quit in 2021 (within 15 yrs)   To calculate your smoking history, I need an accurate estimate of how many packs of cigarettes you smoked per day and for how many years. (Not just the number of PPD you are now smoking)   Years smoking 45 x Packs per day 2 = Pack years 90   (at least 20 pack yrs)   (Make sure they understand that we need to know how much they have smoked in the past, not just the number of PPD they are smoking now)  Do you have a personal history of cancer?  No    Do you have a family history of cancer? Yes  (cancer type and and relative) Myeloma  Are you coughing up blood?  No  Have you had unexplained weight loss of 15 lbs or more in the last 6 months? No  It looks like you meet all criteria.  When would be a good time for us  to schedule you for this screening?   Additional information: N/A

## 2025-01-08 ENCOUNTER — Ambulatory Visit: Admitting: *Deleted

## 2025-01-08 ENCOUNTER — Encounter: Payer: Self-pay | Admitting: *Deleted

## 2025-01-08 DIAGNOSIS — Z87891 Personal history of nicotine dependence: Secondary | ICD-10-CM

## 2025-01-08 NOTE — Patient Instructions (Signed)
 Thank you for participating in the [facility]. It was our pleasure to meet you today. We will call you with the results of your scan within the next few days. Your scan will be assigned a Lung RADS category score by the physicians reading the scans.  This Lung RADS score determines follow up scanning.  See below for description of categories, and follow up screening recommendations. We will be in touch to schedule your follow up screening annually or based on recommendations of our providers. We will fax a copy of your scan results to your Primary Care Physician, or the physician who referred you to the program, to ensure they have the results. Please call the office if you have any questions or concerns regarding your scanning experience or results.  Our office number is [phone]. Please speak with Karna Curly, RN., Karna Doom RN, or [facility] RN, and Isaiah Dover RN. They are  our Lung Cancer Screening RN.'s If They are unavailable when you call, Please leave a message on the voice mail. We will return your call at our earliest convenience.This voice mail is monitored several times a day.  Remember, if your scan is normal, we will scan you annually as long as you continue to meet the criteria for the program. (Age 67-80, Current smoker or smoker who has quit within the last 15 years). If you are a smoker, remember, quitting is the single most powerful action that you can take to decrease your risk of lung cancer and other pulmonary, breathing related problems. We know quitting is hard, and we are here to help.  Please let us  know if there is anything we can do to help you meet your goal of quitting. If you are a former smoker, counselling psychologist. We are proud of you! Remain smoke free! Remember you can refer friends or family members through the number above.  We will screen them to make sure they meet criteria for the program. Thank you for helping us   take better care of you by participating in Lung Screening.  For Virtual Smoking Cessation Classes , The American Lung Association Provides  Freedom From Smoking Classes.  Please search their website for dates and times.    Lung RADS Categories:  Lung RADS 1: no nodules or definitely non-concerning nodules.  Recommendation is for a repeat annual scan in 12 months.  Lung RADS 2:  nodules that are non-concerning in appearance and behavior with a very low likelihood of becoming an active cancer. Recommendation is for a repeat annual scan in 12 months.  Lung RADS 3: nodules that are probably non-concerning , includes nodules with a low likelihood of becoming an active cancer.  Recommendation is for a 78-month repeat screening scan. Often noted after an upper respiratory illness. We will be in touch to make sure you have no questions, and to schedule your 10-month scan.  Lung RADS 4 A: nodules with concerning findings, recommendation is most often for a follow up scan in 3 months or additional testing based on our provider's assessment of the scan. We will be in touch to make sure you have no questions and to schedule the recommended 3 month follow up scan.  Lung RADS 4 B:  indicates findings that are concerning. We will be in touch with you to schedule additional diagnostic testing based on our provider's  assessment of the scan.  You can receive free nicotine  replacement therapy ( patches, gum or mints) by calling 1-800-QUIT NOW.  Please call so we can get you on the path to becoming  a non-smoker. I know it is hard, but you can do this!  Other options for assistance in smoking cessation ( As covered by your insurance benefits)  Hypnosis for smoking cessation  [employer] [phone]  Acupuncture for smoking cessation  [employer] [phone]

## 2025-01-08 NOTE — Progress Notes (Signed)
 Virtual Visit via Telephone Note  I connected with Theresa Gibson on 01/08/25 at  9:00 AM EST by telephone and verified that I am speaking with the correct person using two identifiers.  Location: Patient: at home Provider: [address] [place], [place], [address]    I discussed the limitations, risks, security and privacy concerns of performing an evaluation and management service by telephone and the availability of in person appointments. I also discussed with the patient that there may be a patient responsible charge related to this service. The patient expressed understanding and agreed to proceed.   Shared Decision Making Visit Lung Cancer Screening Program [phone])   Eligibility: Age 69 y.o. Pack Years Smoking History Calculation 84 (# packs/per year x # years smoked) Recent History of coughing up blood  no Unexplained weight loss? no ( >Than 15 pounds within the last 6 months ) Prior History Lung / other cancer no (Diagnosis within the last 5 years already requiring surveillance chest CT Scans). Smoking Status Former Smoker Former Smokers: Years since quit: 6 years  Quit Date: 2019  Visit Components: Discussion included one or more decision making aids. yes Discussion included risk/benefits of screening. yes Discussion included potential follow up diagnostic testing for abnormal scans. yes Discussion included meaning and risk of over diagnosis. yes Discussion included meaning and risk of False Positives. yes Discussion included meaning of total radiation exposure. yes  Counseling Included: Importance of adherence to annual lung cancer LDCT screening. yes Impact of comorbidities on ability to participate in the program. yes Ability and willingness to under diagnostic treatment. yes  Smoking Cessation Counseling: Current Smokers:  Discussed importance of smoking cessation. yes Information about tobacco cessation classes and interventions provided to patient.  yes Patient provided with ticket for LDCT Scan. no Symptomatic Patient. no  Counseling(Intermediate counseling: > three minutes) 99406 Diagnosis Code: Tobacco Use Z72.0 Asymptomatic Patient yes  Counseling (Intermediate counseling: > three minutes counseling) [id number] Former Smokers:  Discussed the importance of maintaining cigarette abstinence. yes Diagnosis Code: Personal History of Nicotine Dependence. S12.108 Information about tobacco cessation classes and interventions provided to patient. Yes Patient provided with ticket for LDCT Scan. no Written Order for Lung Cancer Screening with LDCT placed in Epic. Yes (CT Chest Lung Cancer Screening Low Dose W/O CM) [id number] Z12.2-Screening of respiratory organs Z87.891-Personal history of nicotine dependence   Laneta Speaks, RN

## 2025-01-12 ENCOUNTER — Ambulatory Visit

## 2025-01-13 ENCOUNTER — Other Ambulatory Visit
